# Patient Record
Sex: Male | Born: 1940 | Race: White | Hispanic: No | Marital: Married | Smoking: Never smoker
Health system: Southern US, Community
[De-identification: ages and names within clinical notes are randomized; demographics above are authoritative.]

## PROBLEM LIST (undated history)

## (undated) ENCOUNTER — Ambulatory Visit: Admission: EM

## (undated) DIAGNOSIS — K5790 Diverticulosis of intestine, part unspecified, without perforation or abscess without bleeding: Secondary | ICD-10-CM

## (undated) DIAGNOSIS — T7840XA Allergy, unspecified, initial encounter: Secondary | ICD-10-CM

## (undated) DIAGNOSIS — Z8619 Personal history of other infectious and parasitic diseases: Secondary | ICD-10-CM

## (undated) DIAGNOSIS — T8859XA Other complications of anesthesia, initial encounter: Secondary | ICD-10-CM

## (undated) DIAGNOSIS — C44311 Basal cell carcinoma of skin of nose: Secondary | ICD-10-CM

## (undated) DIAGNOSIS — J9383 Other pneumothorax: Secondary | ICD-10-CM

## (undated) DIAGNOSIS — Z85828 Personal history of other malignant neoplasm of skin: Secondary | ICD-10-CM

## (undated) DIAGNOSIS — C801 Malignant (primary) neoplasm, unspecified: Secondary | ICD-10-CM

## (undated) DIAGNOSIS — Z8709 Personal history of other diseases of the respiratory system: Secondary | ICD-10-CM

## (undated) DIAGNOSIS — M1612 Unilateral primary osteoarthritis, left hip: Secondary | ICD-10-CM

## (undated) DIAGNOSIS — H269 Unspecified cataract: Secondary | ICD-10-CM

## (undated) DIAGNOSIS — L570 Actinic keratosis: Secondary | ICD-10-CM

## (undated) DIAGNOSIS — M545 Low back pain: Secondary | ICD-10-CM

## (undated) HISTORY — DX: Basal cell carcinoma of skin of nose: C44.311

## (undated) HISTORY — DX: Personal history of other diseases of the respiratory system: Z87.09

## (undated) HISTORY — DX: Personal history of melanoma in-situ: Z86.006

## (undated) HISTORY — PX: SKIN CANCER EXCISION: SHX779

## (undated) HISTORY — DX: Allergy, unspecified, initial encounter: T78.40XA

## (undated) HISTORY — DX: Personal history of other infectious and parasitic diseases: Z86.19

## (undated) HISTORY — DX: Diverticulosis of intestine, part unspecified, without perforation or abscess without bleeding: K57.90

## (undated) HISTORY — DX: Actinic keratosis: L57.0

## (undated) HISTORY — PX: JOINT REPLACEMENT: SHX530

## (undated) HISTORY — DX: Low back pain: M54.5

## (undated) HISTORY — PX: LASIK: SHX215

## (undated) HISTORY — DX: Personal history of other malignant neoplasm of skin: Z85.828

## (undated) HISTORY — PX: OTHER SURGICAL HISTORY: SHX169

## (undated) HISTORY — PX: EYE SURGERY: SHX253

## (undated) HISTORY — DX: Unspecified cataract: H26.9

## (undated) HISTORY — DX: Unilateral primary osteoarthritis, left hip: M16.12

---

## 1966-03-16 HISTORY — PX: HEMORRHOID SURGERY: SHX153

## 1980-03-16 DIAGNOSIS — Z8709 Personal history of other diseases of the respiratory system: Secondary | ICD-10-CM

## 1980-03-16 HISTORY — PX: CHEST TUBE INSERTION: SHX231

## 1980-03-16 HISTORY — DX: Personal history of other diseases of the respiratory system: Z87.09

## 1991-03-17 DIAGNOSIS — Z85828 Personal history of other malignant neoplasm of skin: Secondary | ICD-10-CM

## 1991-03-17 HISTORY — DX: Personal history of other malignant neoplasm of skin: Z85.828

## 1999-03-17 HISTORY — PX: TRIGGER FINGER RELEASE: SHX641

## 2003-03-17 DIAGNOSIS — K5790 Diverticulosis of intestine, part unspecified, without perforation or abscess without bleeding: Secondary | ICD-10-CM

## 2003-03-17 HISTORY — DX: Diverticulosis of intestine, part unspecified, without perforation or abscess without bleeding: K57.90

## 2003-07-15 HISTORY — PX: COLONOSCOPY: SHX174

## 2009-04-05 LAB — PSA: PSA: 2.4

## 2010-03-16 DIAGNOSIS — M545 Low back pain, unspecified: Secondary | ICD-10-CM

## 2010-03-16 HISTORY — DX: Low back pain, unspecified: M54.50

## 2011-04-15 ENCOUNTER — Ambulatory Visit (INDEPENDENT_AMBULATORY_CARE_PROVIDER_SITE_OTHER): Payer: Medicare Other | Admitting: Family Medicine

## 2011-04-15 ENCOUNTER — Encounter: Payer: Self-pay | Admitting: Family Medicine

## 2011-04-15 VITALS — BP 112/76 | HR 72 | Temp 97.8°F | Ht 71.5 in | Wt 201.8 lb

## 2011-04-15 DIAGNOSIS — M25519 Pain in unspecified shoulder: Secondary | ICD-10-CM

## 2011-04-15 DIAGNOSIS — Z0000 Encounter for general adult medical examination without abnormal findings: Secondary | ICD-10-CM | POA: Insufficient documentation

## 2011-04-15 DIAGNOSIS — M545 Low back pain, unspecified: Secondary | ICD-10-CM | POA: Diagnosis not present

## 2011-04-15 DIAGNOSIS — M25511 Pain in right shoulder: Secondary | ICD-10-CM | POA: Insufficient documentation

## 2011-04-15 DIAGNOSIS — E669 Obesity, unspecified: Secondary | ICD-10-CM

## 2011-04-15 DIAGNOSIS — M25512 Pain in left shoulder: Secondary | ICD-10-CM | POA: Insufficient documentation

## 2011-04-15 MED ORDER — NAPROXEN 500 MG PO TABS: ORAL_TABLET | ORAL | Status: DC

## 2011-04-15 NOTE — Assessment & Plan Note (Signed)
R>L Anticipate bilateral RTC tendinopathy/injury.  Provided with stretching exercises from [facility] pt advisor.  Pt states has resistance bands at home but has not been doing exercises (daughter in law is physical therapist).  Naprosyn 500mg  bid with food for 3-5 days then prn. If not improving, check Xray, consider formal PT referral. If not improving, consider check ESR to r/o PMR given endorsed stiffness.

## 2011-04-15 NOTE — Patient Instructions (Addendum)
Return at your convenience fasting for blood work, come in a few days afterwards for wellness visit. We will check urine at that visit. Good to meet you today, call us with questions. Take naprosyn twice daily with food for 5 days then as needed.  Use heating pad or ice to shoulders.  If not better, we will consider PT and xray.

## 2011-04-15 NOTE — Progress Notes (Signed)
Subjective:    Patient ID: Samuel Matthews, male    DOB: 1940-11-02, 71 y.o.   MRN: [id number]  HPI CC: new medicare pt establish.  Recently moved from [place], lives in [place].  Mild bilateral shoulder pain - burning sensation.  Currently mild tenderness.  Some early morning stiffness, as well as when playing golf.  Doesn't bother him too much.  Has not tried anything other than aleve occasionally (which doesn't really help).  No neck pain.  No shooting pain down arms, no paresthesias.  Possible mild weakness esp worse with opening car door.  Main concern is progression to the point where unable to play golf anymore.  Denies depression, anhedonia.  Denies falls in last year.  States has had microhematuria in past.  Caffeine: 2-3 cups coffee/day Lives with wife in [place].  Son locally. Occupation: retired, Production designer, theatre/television/film with [employer], now working at golf course Edu: 4 yrs college Activity: plays golf, occasional walking Diet: fruits/vegetables daily, 2x/wk red meat, fish 1x/wk  Preventative: Last CPE 2 yrs ago. Declines flu shot today. No pneumonia shot. Td 2010 Colonoscopy 2005, good for 7-10 yrs Prostate- never had screening.  Discussed.  Declines.  Medications and allergies reviewed and updated in chart.  Past histories reviewed and updated if relevant as below. There is no problem list on file for this patient.  Past Medical History  Diagnosis Date  . History of chicken pox   . 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 Pneumothorax 1982    rib fracture, from a fall   Past Surgical History  Procedure Date  . Hemorrhoid surgery 1968  . Skin cancer excision 1993    Left calf  . Chest tube insertion 1982  . Trigger finger release 2001   History  Substance Use Topics  . Smoking status: Never Smoker   . Smokeless tobacco: Never Used  . Alcohol Use: Yes     Occasional   Family History  Problem Relation Age of Onset  . Coronary artery  disease Mother 14  . Hypertension Father   . Diabetes Neg Hx   . Cancer Neg Hx    Allergies  Allergen Reactions  . Sulfa Drugs Cross Reactors Hives   No current outpatient prescriptions on file prior to visit.   Review of Systems  Constitutional: Negative for fever, chills, activity change, appetite change, fatigue and unexpected weight change.  HENT: Negative for hearing loss and neck pain.   Eyes: Negative for visual disturbance.  Respiratory: Negative for cough, chest tightness, shortness of breath and wheezing.   Cardiovascular: Negative for chest pain, palpitations and leg swelling.  Gastrointestinal: Negative for nausea, vomiting, abdominal pain, diarrhea, constipation, blood in stool and abdominal distention.  Genitourinary: Negative for hematuria and difficulty urinating.  Musculoskeletal: Negative for myalgias and arthralgias.  Skin: Negative for rash.  Neurological: Negative for dizziness, seizures, syncope and headaches.  Hematological: Does not bruise/bleed easily.  Psychiatric/Behavioral: Negative for dysphoric mood. The patient is not nervous/anxious.        Objective:   Physical Exam  Nursing note and vitals reviewed. Constitutional: He is oriented to person, place, and time. He appears well-developed and well-nourished. No distress.  HENT:  Head: Normocephalic and atraumatic.  Right Ear: External ear normal.  Left Ear: External ear normal.  Nose: Nose normal.  Mouth/Throat: Oropharynx is clear and moist. No oropharyngeal exudate.  Eyes: Conjunctivae and EOM are normal. Pupils are equal, round, and reactive to light. No  scleral icterus.  Neck: Normal range of motion. Neck supple. Carotid bruit is not present. No thyromegaly present.  Cardiovascular: Normal rate, regular rhythm, normal heart sounds and intact distal pulses.   No murmur heard. Pulses:      Radial pulses are 2+ on the right side, and 2+ on the left side.  Pulmonary/Chest: Effort normal and  breath sounds normal. No respiratory distress. He has no wheezes. He has no rales.  Abdominal: Soft. Bowel sounds are normal. He exhibits no distension and no mass. There is no tenderness. There is no rebound and no guarding.  Musculoskeletal: Normal range of motion.       No midline spine tenderness, neg spurling, FROM at neck. R shoulder is elevated compared to left. FROM at shoulders. + empty can sign bilaterally R>L.  No pain/weakness with int rotation at shoulder against resistance, mild weakness with ext rotation. Tender to palpation anterior shoulder at bicipital groove but neg speed and yergason test.  Lymphadenopathy:    He has no cervical adenopathy.  Neurological: He is alert and oriented to person, place, and time. He has normal strength. No sensory deficit. He exhibits normal muscle tone.       CN grossly intact, station and gait intact  Skin: Skin is warm and dry. No rash noted.  Psychiatric: He has a normal mood and affect. His behavior is normal. Judgment and thought content normal.       Assessment & Plan:

## 2011-04-15 NOTE — Assessment & Plan Note (Signed)
Advised avoid heavy lifting.

## 2011-04-21 ENCOUNTER — Other Ambulatory Visit (INDEPENDENT_AMBULATORY_CARE_PROVIDER_SITE_OTHER): Payer: Medicare Other

## 2011-04-21 DIAGNOSIS — Z0000 Encounter for general adult medical examination without abnormal findings: Secondary | ICD-10-CM

## 2011-04-21 DIAGNOSIS — E669 Obesity, unspecified: Secondary | ICD-10-CM | POA: Diagnosis not present

## 2011-04-21 LAB — COMPREHENSIVE METABOLIC PANEL
ALT: 16 U/L (ref 0–53)
AST: 17 U/L (ref 0–37)
Albumin: 3.8 g/dL (ref 3.5–5.2)
Alkaline Phosphatase: 30 U/L — ABNORMAL LOW (ref 39–117)
BUN: 15 mg/dL (ref 6–23)
CO2: 28 mEq/L (ref 19–32)
Calcium: 9 mg/dL (ref 8.4–10.5)
Chloride: 106 mEq/L (ref 96–112)
Creatinine, Ser: 1.1 mg/dL (ref 0.4–1.5)
GFR: 69.72 mL/min (ref >60.00)
Glucose, Bld: 98 mg/dL (ref 70–99)
Potassium: 4.8 mEq/L (ref 3.5–5.1)
Sodium: 139 mEq/L (ref 135–145)
Total Bilirubin: 0.7 mg/dL (ref 0.3–1.2)
Total Protein: 6.4 g/dL (ref 6.0–8.3)

## 2011-04-21 LAB — CBC WITH DIFFERENTIAL/PLATELET
Basophils Absolute: 0 10*3/uL (ref 0.0–0.1)
Basophils Relative: 0.3 % (ref 0.0–3.0)
Eosinophils Absolute: 0.3 10*3/uL (ref 0.0–0.7)
Eosinophils Relative: 2.9 % (ref 0.0–5.0)
HCT: 39.8 % (ref 39.0–52.0)
Hemoglobin: 13.8 g/dL (ref 13.0–17.0)
Lymphocytes Relative: 17.2 % (ref 12.0–46.0)
Lymphs Abs: 1.5 10*3/uL (ref 0.7–4.0)
MCHC: 34.7 g/dL (ref 30.0–36.0)
MCV: 93.9 fl (ref 78.0–100.0)
Monocytes Absolute: 0.7 10*3/uL (ref 0.1–1.0)
Monocytes Relative: 7.5 % (ref 3.0–12.0)
Neutro Abs: 6.4 10*3/uL (ref 1.4–7.7)
Neutrophils Relative %: 72.1 % (ref 43.0–77.0)
Platelets: 206 10*3/uL (ref 150.0–400.0)
RBC: 4.23 Mil/uL (ref 4.22–5.81)
RDW: 12.9 % (ref 11.5–14.6)
WBC: 8.9 10*3/uL (ref 4.5–10.5)

## 2011-04-21 LAB — LIPID PANEL
Cholesterol: 171 mg/dL (ref 0–200)
HDL: 52.2 mg/dL (ref >39.00)
LDL Cholesterol: 111 mg/dL — ABNORMAL HIGH (ref 0–99)
Total CHOL/HDL Ratio: 3
Triglycerides: 39 mg/dL (ref 0.0–149.0)
VLDL: 7.8 mg/dL (ref 0.0–40.0)

## 2011-04-28 ENCOUNTER — Ambulatory Visit (INDEPENDENT_AMBULATORY_CARE_PROVIDER_SITE_OTHER): Payer: Medicare Other | Admitting: Family Medicine

## 2011-04-28 ENCOUNTER — Encounter: Payer: Self-pay | Admitting: Family Medicine

## 2011-04-28 VITALS — BP 124/72 | HR 76 | Temp 98.2°F | Wt 201.8 lb

## 2011-04-28 DIAGNOSIS — M25519 Pain in unspecified shoulder: Secondary | ICD-10-CM

## 2011-04-28 DIAGNOSIS — Z0000 Encounter for general adult medical examination without abnormal findings: Secondary | ICD-10-CM | POA: Diagnosis not present

## 2011-04-28 DIAGNOSIS — M25512 Pain in left shoulder: Secondary | ICD-10-CM

## 2011-04-28 DIAGNOSIS — M25511 Pain in right shoulder: Secondary | ICD-10-CM

## 2011-04-28 DIAGNOSIS — Z1211 Encounter for screening for malignant neoplasm of colon: Secondary | ICD-10-CM | POA: Diagnosis not present

## 2011-04-28 DIAGNOSIS — Z23 Encounter for immunization: Secondary | ICD-10-CM

## 2011-04-28 NOTE — Assessment & Plan Note (Signed)
Prior visit thought RTC tendinopathy.  Pt declines PT referral.  Will continue home exercises, if not better return for steroid injections, consider Xray.

## 2011-04-28 NOTE — Progress Notes (Signed)
Subjective:    Patient ID: Samuel Matthews, male    DOB: 10-17-1940, 71 y.o.   MRN: [id number]  HPI CC: wellness exam  Not noticing naprosyn 500mg  helping.  In am especially both shoulders L>R feel weak, stiff and sore.  No numbness.   Started walking more - 2.2 miles 3 times a week.  Feeling great. Wt Readings from Last 3 Encounters:  04/28/11 201 lb 12 oz (91.513 kg)  04/15/11 201 lb 12 oz (91.513 kg)    Caffeine: 2-3 cups coffee/day Lives with wife in [place], son in [place] Occupation: retired, Production designer, theatre/television/film with [employer], now working at golf course Edu: 4 yrs college Activity: plays golf, occasional walking Diet: fruits/vegetables daily, 2x/wk red meat, fish 1x/wk  Preventative:  Last CPE 2 yrs ago.  No h/o pneumonia shot.  Would like today. Td 2010  Colonoscopy 2005, good for 7-10 yrs  Prostate- never had screening. Discussed. Declines. Seat belt 100% time.   Sunscreen use discussed. Vision screen 10/2010.  No problems with hearing.  Passes whispered hearing test. No falls in last year.  Denies anhedonia or depression.  Medications and allergies reviewed and updated in chart.  Past histories reviewed and updated if relevant as below. Patient Active Problem List  Diagnoses  . Bilateral shoulder pain  . Lower back pain  . Medicare annual wellness visit, initial   Past Medical History  Diagnosis Date  . History of chicken pox   . 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 History of pneumothorax 1982    rib fracture, from a fall  . Lower back pain 2012    thought HNP, improved with PT   Past Surgical History  Procedure Date  . Hemorrhoid surgery 1968  . Skin cancer excision 1993    Left calf  . Chest tube insertion 1982  . Trigger finger release 2001   History  Substance Use Topics  . Smoking status: Never Smoker   . Smokeless tobacco: Never Used  . Alcohol Use: Yes     Occasional   Family History  Problem Relation Age  of Onset  . Coronary artery disease Mother 90  . Hypertension Father   . Diabetes Neg Hx   . Cancer Neg Hx    Allergies  Allergen Reactions  . Sulfa Drugs Cross Reactors Hives   Current Outpatient Prescriptions on File Prior to Visit  Medication Sig Dispense Refill  . aspirin 81 MG tablet Take 160 mg by mouth daily.      . Multiple Vitamin (MULTIVITAMIN) tablet Take 1 tablet by mouth daily.      . naproxen (NAPROSYN) 500 MG tablet Take one po bid x 1 week then prn pain, take with food  60 tablet  0     Review of Systems  Constitutional: Negative for fever, chills, activity change, appetite change, fatigue and unexpected weight change.  HENT: Negative for hearing loss and neck pain.   Eyes: Negative for visual disturbance.  Respiratory: Negative for cough, chest tightness, shortness of breath and wheezing.   Cardiovascular: Negative for chest pain, palpitations and leg swelling.  Gastrointestinal: Negative for nausea, vomiting, abdominal pain, diarrhea, constipation, blood in stool and abdominal distention.  Genitourinary: Negative for hematuria and difficulty urinating.  Musculoskeletal: Negative for myalgias and arthralgias.  Skin: Negative for rash.  Neurological: Negative for dizziness, seizures, syncope and headaches.  Hematological: Does not bruise/bleed easily.  Psychiatric/Behavioral: Negative for dysphoric mood. The patient is  not nervous/anxious.        Objective:   Physical Exam  Nursing note and vitals reviewed. Constitutional: He is oriented to person, place, and time. He appears well-developed and well-nourished. No distress.  HENT:  Head: Normocephalic and atraumatic.  Right Ear: External ear normal.  Left Ear: External ear normal.  Nose: Nose normal.  Mouth/Throat: Oropharynx is clear and moist. No oropharyngeal exudate.  Eyes: Conjunctivae and EOM are normal. Pupils are equal, round, and reactive to light. No scleral icterus.  Neck: Normal range of motion.  Neck supple. Carotid bruit is not present. No thyromegaly present.  Cardiovascular: Normal rate, regular rhythm, normal heart sounds and intact distal pulses.   No murmur heard. Pulses:      Radial pulses are 2+ on the right side, and 2+ on the left side.  Pulmonary/Chest: Effort normal and breath sounds normal. No respiratory distress. He has no wheezes. He has no rales.  Abdominal: Soft. Bowel sounds are normal. He exhibits no distension and no mass. There is no tenderness. There is no rebound and no guarding.  Genitourinary:       declined  Musculoskeletal: Normal range of motion. He exhibits no edema.  Lymphadenopathy:    He has no cervical adenopathy.  Neurological: He is alert and oriented to person, place, and time.       CN grossly intact, station and gait intact No UE weakness, no hip flexor weakness.  Skin: Skin is warm and dry. No rash noted.  Psychiatric: He has a normal mood and affect. His behavior is normal. Judgment and thought content normal.      Assessment & Plan:

## 2011-04-28 NOTE — Assessment & Plan Note (Signed)
I have personally reviewed the Medicare Annual Wellness questionnaire and have noted 1. The patient's medical and social history 2. Their use of alcohol, tobacco or illicit drugs 3. Their current medications and supplements 4. The patient's functional ability including ADL's, fall risks, home safety risks and hearing or visual impairment. 5. Diet and physical activities 6. Evidence for depression or mood disorders The patients weight, height, BMI have been recorded in the chart.  Hearing and vision has been addressed. I have made referrals, counseling and provided education to the patient based review of the above and I have provided the pt with a written personalized care plan for preventive services.  Set up with [facility].  Last colonsocopy 2005, normal. Declines prostate screening - has never had. Vision screen due 10/2011.

## 2011-04-28 NOTE — Progress Notes (Signed)
Addended by: Josph Macho A on: 04/28/2011 09:21 AM   Modules accepted: Orders

## 2011-04-28 NOTE — Patient Instructions (Signed)
Call your insurace about the shingles shot to see if it is covered or how much it would cost and where is cheaper (here or pharmacy).  If you want to receive here, call for nurse visit. Pneumonia shot today. Stool kit today. Let me know if shoulders not improving. Good to see you today, call us with questions.

## 2011-05-18 ENCOUNTER — Other Ambulatory Visit: Payer: Medicare Other

## 2011-05-19 ENCOUNTER — Encounter: Payer: Self-pay | Admitting: *Deleted

## 2011-05-19 LAB — FECAL OCCULT BLOOD, IMMUNOCHEMICAL: Fecal Occult Bld: NEGATIVE

## 2011-05-29 DIAGNOSIS — M25519 Pain in unspecified shoulder: Secondary | ICD-10-CM | POA: Diagnosis not present

## 2011-05-29 DIAGNOSIS — M719 Bursopathy, unspecified: Secondary | ICD-10-CM | POA: Diagnosis not present

## 2011-05-29 DIAGNOSIS — M67919 Unspecified disorder of synovium and tendon, unspecified shoulder: Secondary | ICD-10-CM | POA: Diagnosis not present

## 2011-06-19 ENCOUNTER — Other Ambulatory Visit: Payer: Self-pay

## 2011-06-19 MED ORDER — NAPROXEN 500 MG PO TABS: ORAL_TABLET | ORAL | Status: DC

## 2011-06-19 NOTE — Telephone Encounter (Signed)
Sent in

## 2011-06-19 NOTE — Telephone Encounter (Signed)
Pt's wife request refill Naproxen 500 mg for shoulder discomfort. Pt aggravated shoulder again and wants Naproxen 500 mg sent to [facility]. Pts wife can be reached at [phone].

## 2011-07-25 ENCOUNTER — Other Ambulatory Visit: Payer: Self-pay | Admitting: Family Medicine

## 2011-08-24 ENCOUNTER — Other Ambulatory Visit: Payer: Self-pay | Admitting: Family Medicine

## 2011-09-23 ENCOUNTER — Other Ambulatory Visit: Payer: Self-pay | Admitting: Family Medicine

## 2011-10-22 ENCOUNTER — Other Ambulatory Visit: Payer: Self-pay | Admitting: Family Medicine

## 2012-04-11 ENCOUNTER — Encounter: Payer: Self-pay | Admitting: Family Medicine

## 2012-04-12 DIAGNOSIS — H5210 Myopia, unspecified eye: Secondary | ICD-10-CM | POA: Diagnosis not present

## 2012-04-12 DIAGNOSIS — H2510 Age-related nuclear cataract, unspecified eye: Secondary | ICD-10-CM | POA: Diagnosis not present

## 2012-04-12 DIAGNOSIS — H52229 Regular astigmatism, unspecified eye: Secondary | ICD-10-CM | POA: Diagnosis not present

## 2012-04-12 DIAGNOSIS — H524 Presbyopia: Secondary | ICD-10-CM | POA: Diagnosis not present

## 2012-09-14 DIAGNOSIS — M169 Osteoarthritis of hip, unspecified: Secondary | ICD-10-CM | POA: Diagnosis not present

## 2012-09-20 DIAGNOSIS — M25559 Pain in unspecified hip: Secondary | ICD-10-CM | POA: Diagnosis not present

## 2012-09-20 DIAGNOSIS — M169 Osteoarthritis of hip, unspecified: Secondary | ICD-10-CM | POA: Diagnosis not present

## 2012-10-13 ENCOUNTER — Ambulatory Visit (INDEPENDENT_AMBULATORY_CARE_PROVIDER_SITE_OTHER): Payer: Medicare Other | Admitting: Family Medicine

## 2012-10-13 ENCOUNTER — Encounter: Payer: Self-pay | Admitting: Family Medicine

## 2012-10-13 VITALS — BP 124/80 | HR 84 | Temp 97.8°F | Wt 205.5 lb

## 2012-10-13 DIAGNOSIS — T675XXA Heat exhaustion, unspecified, initial encounter: Secondary | ICD-10-CM | POA: Diagnosis not present

## 2012-10-13 LAB — BASIC METABOLIC PANEL
BUN: 14 mg/dL (ref 6–23)
CO2: 28 mEq/L (ref 19–32)
Calcium: 9.4 mg/dL (ref 8.4–10.5)
Chloride: 106 mEq/L (ref 96–112)
Creatinine, Ser: 1 mg/dL (ref 0.4–1.5)
GFR: 74.91 mL/min (ref >60.00)
Glucose, Bld: 98 mg/dL (ref 70–99)
Potassium: 4.6 mEq/L (ref 3.5–5.1)
Sodium: 140 mEq/L (ref 135–145)

## 2012-10-13 NOTE — Assessment & Plan Note (Signed)
Describes 2 episodes of heat exhaustion back to back. I think it reasonable to check Cr and EKG today to r/o renal or cardiac insufficiency from these recent events. Pt agrees with plan  Discussed preventative measures for recurrent heat exhaustion.

## 2012-10-13 NOTE — Addendum Note (Signed)
Addended by: Josph Macho A on: 10/13/2012 09:57 AM   Modules accepted: Orders

## 2012-10-13 NOTE — Patient Instructions (Addendum)
I think you had heat exhaustion, but seems resolved. Regardless, I'd like to check kidneys and EKG today. We will call you with results. Ensure good hydration throughout the day, even if working outside. Good to see you today, call us with questions.  Heat Disorders Heat related disorders are illnesses caused by continued exposure to hot and humid environments, not drinking enough fluids, and/or your body failing to regulate its temperature correctly. People suffer from heat stress and heat related disorders when their bodies are unable to compensate and cool down through sweating. With sufficient heat, sweating is not enough to keep you cool, and your body temperature can rise quickly. Very high body temperatures can damage your brain and other vital organs. High humidity (moisture in the air), adds to heat stress, because it is harder for sweat to evaporate and cool your body. Heat stress and disorders are not uncommon. Some medicines can increase your risk for heat related illness. Ask your caregiver about your medicines during periods of intense heat.  Heat related disorders include:  Heatstroke. When you cannot sweat or regulate your body temperature in an adequate way. This is very dangerous and can be life threatening. Get emergency medical help.  Heat exhaustion. Overheating causes heavy sweating and a fast heart rate. Your body can still regulate its own temperature.  Heat cramps. Painful, uncontrollable muscle spasms. Can occur during heavy exercise in hot environments.  Sunburn. Skin becomes red and painful (burned) after being out in the sun.  Heat rash. Sweat ducts become blocked, which traps sweat under the skin. This causes blisters and red bumps and may cause an itchy or tingling feeling. PREVENTING HEAT STRESS AND HEAT RELATED DISORDERS Overheating can be dangerous and life threatening. When exercising, working, or doing other activities in hot and humid environments, do the  following:  Stay informed by listening to and watching local broadcast weather and safety updates during intense heat.  Air conditioning is the best way to prevent heat disorders. If your home is not air conditioned, spend time in air conditioned places (malls, [employer], or heat shelters set up by your local health department).  Wear light-weight, light colored, loose fitting clothing. Wear as little clothing as possible when at home.  Increase your fluid intake. Drink enough water and fluids to keep your urine clear or pale yellow. DO NOT WAIT UNTIL YOU ARE THIRSTY TO DRINK. You may already be heat stressed, and not recognize it.  If your caregiver has suggested that you limit the amount of fluid you drink or has prescribed water pills for a medical problem, ask how much you should drink when the weather is hot.  Do not drink liquids with alcohol, caffeine, or lots of sugar. They can cause more loss of body fluid.  Heavy sweating drains your body's salt and minerals, which must be replaced. If you must exercise in the heat, a sports beverage can replace the salt and minerals you lose in sweat. If you are on a low-salt diet, check with your caregiver before drinking a sports beverage.  Sunburn reduces your body's ability to cool itself and causes a loss of needed body fluids. If you go outdoors, protect yourself from the sun by wearing a wide-brimmed hat, along with sunglasses.  Put on sunscreen of SPF 15 or higher, 30 minutes before going out. (The most effective products say "broad spectrum" or "UVA/UVB protection" on the label.) Reapply sunscreen frequently -- at least every 1-2 hours.  Take added precautions when  both the heat and humidity are high.  Rest often.  Even young and otherwise healthy people can become heat stressed and suffer from a heat disorder, if they participate in strenuous activities during hot weather.  If you must be outdoors, try going out only during  morning and evening hours, when it is cooler. Rest often in shady areas, so that your body's temperature can adjust.  If your heart pounds or you are gasping for breath, STOP all activity. Go immediately to a cool area, or at least into the shade, and rest. This is especially true if you become lightheaded, confused, weak, or faint.  Electric fans may make you comfortable, but they DO NOT prevent heat related problems. SYMPTOMS   Headache.  Nosebleed.  Weakness.  You feel very hot.  Muscle cramps.  Restlessness.  Fainting or dizziness.  Fast breathing and shortness of breath.  Excessive sweating. (There may be little or no sweating in late stages of heat exhaustion.)  Rapid pulse, heart pounding.  Feeling sick to your stomach (nauseous, vomiting).  Skin becoming cold and clammy, or excessively hot and dry. HOME CARE INSTRUCTIONS   Lie down and rest in a cool or air conditioned area.  Drink enough water and fluids to keep your urine clear or pale yellow. Avoid fluids with caffeine or high sugar content. Avoid coffee, tea, alcohol or stimulants.  Do not take salt tablets, unless advised by your caregiver.  Avoid hot foods and heavy meals.  Bathe or shower in cool water.  Wear minimal clothing.  Use a fan. Add cool or warm mist to the air, if possible.  If possible, decrease the use of your stove or oven at home.  Monitor adults at risk at least twice a day, watching closely for signs of heat exhaustion or heat stroke. Infants and young children also require more frequent watching.  Never leave infants, children or pets in a parked car, even if the windows are cracked open.  If you are 18 years of age or older, have a friend or relative call to check on you twice a day during a heat wave. If you know someone in this age group, check on them at least twice a day. SEEK IMMEDIATE MEDICAL CARE IF:  You have a hard time breathing.  You vomit or pass blood in your  stool.  You have a seizure, feel dizzy or faint, or pass out.  You develop severe sweating.  Your skin is red, hot and dry (there is no sweating).  Your urine turns a dark color or has blood in it.  You are making very little or no urine.  You are unable to keep fluids down.  You develop chest or abdominal pain.  You develop a throbbing headache.  You develop nausea or confusion. IF YOU OBSERVE SOMEONE WHO MIGHT HAVE HEAT STROKE This can be life threatening. Call your local emergency services (911 in the [place]  If the victim is in the sun, get him or her to a shady area.  Cool the victim rapidly, using whatever methods you have:  Place the victim in a tub of cool water or a cool shower.  Spray the victim with cool water from a garden hose, or sponge the person with cool water.  Wrap the victim in a cool, wet sheet and fan them.  If emergency medical help is delayed, call the hospital emergency room or your local emergency services (911 in the [place] for further instructions.  Sometimes  a victim's muscles will begin to twitch from heat stroke. If this happens, keep the victim from injuring himself. However, do not place any object in the mouth. Give fluids, unless the muscle twitching makes it difficult or unsafe to do so. If there is vomiting, make sure the airway remains open by turning the victim on his or her side. Document Released: 02/28/2000 Document Revised: 05/25/2011 Document Reviewed: 12/17/2008 [facility] Patient Information 2014 [place], [place].

## 2012-10-13 NOTE — Progress Notes (Signed)
  Subjective:    Patient ID: Samuel Matthews, male    DOB: 07-27-1940, 72 y.o.   MRN: [id number]  HPI CC: dehydration  Thinks may have overdone it last Wednesday - working on removing 10-12 foot saplings.  Cut them down with chainsaw then cut them into pieces.  Felt winded and lightheaded, diaphoretic.  The next day removed large heavy stones in backyard - again felt lightheaded, saw spots in vision, cramping in hands, short of breath.  Hot day.  Was working for 3 hours straight - hadn't eaten anything that night.  2 days later, drove up to [place] - felt nauseated, vomiting, GI upset.  That night had no nocturia.  Overall feeling better - back to normal.   Has been drinking gatorade.  Never chest pain or tightness.  No headaches.  Upcoming golf trip next week.  To [place]  On aspirin 81mg  daily and multivitamin. fmhx CAD - mother at age 29.   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  Had steroid shot into hip which didn't help pain.  May start aleve.  Past Medical History  Diagnosis Date  . History of chicken pox   . 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 History of pneumothorax 1982    rib fracture, from a fall  . Lower back pain 2012    thought HNP, improved with PT    Past Surgical History  Procedure Laterality Date  . Hemorrhoid surgery  1968  . Skin cancer excision  1993    Left calf  . Chest tube insertion  1982  . Trigger finger release  2001    Family History  Problem Relation Age of Onset  . Coronary artery disease Mother 11  . Hypertension Father   . Diabetes Neg Hx   . Cancer Neg Hx     Review of Systems Per HPI    Objective:   Physical Exam  Nursing note and vitals reviewed. Constitutional: He appears well-developed and well-nourished. No distress.  HENT:  Head: Normocephalic and atraumatic.  Mouth/Throat: Oropharynx is clear and moist. No oropharyngeal exudate.   Cardiovascular: Normal rate, regular rhythm, normal heart sounds and intact distal pulses.   No murmur heard. Pulmonary/Chest: Effort normal and breath sounds normal. No respiratory distress. He has no wheezes. He has no rales.  Musculoskeletal: He exhibits no edema.  Skin: Skin is warm and dry. No rash noted.  Psychiatric: He has a normal mood and affect.       Assessment & Plan:

## 2012-10-14 ENCOUNTER — Telehealth: Payer: Self-pay | Admitting: Family Medicine

## 2012-10-14 DIAGNOSIS — Z85828 Personal history of other malignant neoplasm of skin: Secondary | ICD-10-CM

## 2012-10-14 NOTE — Telephone Encounter (Signed)
Referral placed.  plz notify wife

## 2012-10-14 NOTE — Telephone Encounter (Signed)
Patients wife notified

## 2012-10-14 NOTE — Telephone Encounter (Signed)
Pt's wife called and says pt has a raised spot on the back of his left calf and that several years ago he had cancerous tissue removed from that let.  He wants a referral to a dermatologist. Can you refer him? Or does he need to see you again first? Thank you.

## 2012-11-10 DIAGNOSIS — M169 Osteoarthritis of hip, unspecified: Secondary | ICD-10-CM | POA: Diagnosis not present

## 2012-11-24 ENCOUNTER — Telehealth: Payer: Self-pay | Admitting: Family Medicine

## 2012-11-24 NOTE — Telephone Encounter (Signed)
Appt scheduled

## 2012-11-24 NOTE — Telephone Encounter (Addendum)
Received preop eval request from [place] ortho Dr. 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 plz call pt and schedule preop appt at his convenience. Doesn't need EKG as done 09/2012 but may need further blood work

## 2012-12-05 DIAGNOSIS — Z85828 Personal history of other malignant neoplasm of skin: Secondary | ICD-10-CM | POA: Diagnosis not present

## 2012-12-05 DIAGNOSIS — L905 Scar conditions and fibrosis of skin: Secondary | ICD-10-CM | POA: Diagnosis not present

## 2012-12-05 DIAGNOSIS — C44721 Squamous cell carcinoma of skin of unspecified lower limb, including hip: Secondary | ICD-10-CM | POA: Diagnosis not present

## 2012-12-05 DIAGNOSIS — C44621 Squamous cell carcinoma of skin of unspecified upper limb, including shoulder: Secondary | ICD-10-CM | POA: Diagnosis not present

## 2012-12-05 DIAGNOSIS — D485 Neoplasm of uncertain behavior of skin: Secondary | ICD-10-CM | POA: Diagnosis not present

## 2012-12-05 DIAGNOSIS — D239 Other benign neoplasm of skin, unspecified: Secondary | ICD-10-CM | POA: Diagnosis not present

## 2012-12-13 ENCOUNTER — Encounter: Payer: Self-pay | Admitting: Family Medicine

## 2012-12-13 ENCOUNTER — Ambulatory Visit (INDEPENDENT_AMBULATORY_CARE_PROVIDER_SITE_OTHER): Payer: Medicare Other | Admitting: Family Medicine

## 2012-12-13 VITALS — BP 116/70 | HR 76 | Temp 98.1°F | Wt 205.5 lb

## 2012-12-13 DIAGNOSIS — Z01818 Encounter for other preprocedural examination: Secondary | ICD-10-CM

## 2012-12-13 DIAGNOSIS — Z23 Encounter for immunization: Secondary | ICD-10-CM

## 2012-12-13 LAB — CBC WITH DIFFERENTIAL/PLATELET
Basophils Absolute: 0 10*3/uL (ref 0.0–0.1)
Basophils Relative: 0.6 % (ref 0.0–3.0)
Eosinophils Absolute: 0.3 10*3/uL (ref 0.0–0.7)
Eosinophils Relative: 4.1 % (ref 0.0–5.0)
HCT: 39.4 % (ref 39.0–52.0)
Hemoglobin: 13.4 g/dL (ref 13.0–17.0)
Lymphocytes Relative: 25.4 % (ref 12.0–46.0)
Lymphs Abs: 1.6 10*3/uL (ref 0.7–4.0)
MCHC: 34 g/dL (ref 30.0–36.0)
MCV: 94 fl (ref 78.0–100.0)
Monocytes Absolute: 0.6 10*3/uL (ref 0.1–1.0)
Monocytes Relative: 9.1 % (ref 3.0–12.0)
Neutro Abs: 3.8 10*3/uL (ref 1.4–7.7)
Neutrophils Relative %: 60.8 % (ref 43.0–77.0)
Platelets: 227 10*3/uL (ref 150.0–400.0)
RBC: 4.19 Mil/uL — ABNORMAL LOW (ref 4.22–5.81)
RDW: 13.1 % (ref 11.5–14.6)
WBC: 6.3 10*3/uL (ref 4.5–10.5)

## 2012-12-13 NOTE — Progress Notes (Signed)
  Subjective:    Patient ID: Samuel Matthews, male    DOB: 1941/03/11, 72 y.o.   MRN: [id number]  HPI CC: preop evaluation  Received preop eval request from [place] orth[facility] Dr. Despina Hick for upcoming L hip replacement dated 02/03/2013.  Did have hip interarticular steroid injection with poor results.  Told bone on bone.  Taking advil/aleve prn.    Still able to play golf and do things he wants to do, however notes pain especially with getting up from seated position and with certain movements.  H/o remote broken ribs and collapsed lung s/p chest tube insertion after fall while playing volley. 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  EKG done 09/2012 after heat exhaustion from yardwork - NSR rate 60, normal axis, intervals, no acute ST/T changes.  Recently joined [employer], goes 3x/wk for 45 min.  Using stationary bike - riding stationary bike 3 miles regularly, using upper extremity weights.  No prior h/o major surgery or general anesthesia. Drug allergy - sulfa.  On aspirin 81mg  daily and multivitamin.  fmhx CAD - mother at age 1.  No h/o HLD, DM, HTN No h/o blood disorders. Body mass index is 28.27 kg/(m^2).  Past Medical History  Diagnosis Date  . History of chicken pox   . 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 History of pneumothorax 1982    rib fracture, from a fall  . Lower back pain 2012    thought HNP, improved with PT    Past Surgical History  Procedure Laterality Date  . Hemorrhoid surgery  1968  . Skin cancer excision  1993    Left calf  . Chest tube insertion  1982  . Trigger finger release  2001    Family History  Problem Relation Age of Onset  . Coronary artery disease Mother 62  . Hypertension Father   . Diabetes Neg Hx   . Cancer Neg Hx    Current Outpatient Prescriptions on File Prior to Visit  Medication Sig Dispense Refill  . aspirin 81 MG tablet Take 160 mg by mouth daily.      . Multiple Vitamin (MULTIVITAMIN)  tablet Take 1 tablet by mouth daily.       No current facility-administered medications on file prior to visit.    Review of Systems No recent chest pain, shortness of breath, headaches, lightheadedness or dizziness, cough, fevers/chills.  No appetite or weight changes.  No recent viral URI.  No easy bruising or bleeding.     Objective:   Physical Exam  Nursing note and vitals reviewed. Constitutional: He appears well-developed and well-nourished. No distress.  HENT:  Head: Normocephalic and atraumatic.  Mouth/Throat: Oropharynx is clear and moist. No oropharyngeal exudate.  Eyes: Conjunctivae and EOM are normal. Pupils are equal, round, and reactive to light.  Neck: Normal range of motion. Neck supple. Carotid bruit is not present. No thyromegaly present.  Cardiovascular: Normal rate, regular rhythm, normal heart sounds and intact distal pulses.   No murmur heard. Pulmonary/Chest: Effort normal and breath sounds normal. No respiratory distress. He has no wheezes. He has no rales.  Musculoskeletal: He exhibits no edema.  Lymphadenopathy:    He has no cervical adenopathy.  Psychiatric: He has a normal mood and affect.       Assessment & Plan:

## 2012-12-13 NOTE — Assessment & Plan Note (Addendum)
Intermediate risk procedure, low risk patient (main risk is age).  Anticipated MET of 10. Recent Cr stable.  Check CBC today.  Recent EKG stable.  No need for CXR today. No prior experience with GETA. Discussed should be adequately low risk to proceed with planned surgery. rec maximize non narcotic analgesia post op.

## 2012-12-13 NOTE — Patient Instructions (Signed)
Flu shot today Blood work today. I hope you have a speedy recovery!

## 2012-12-14 ENCOUNTER — Encounter: Payer: Self-pay | Admitting: *Deleted

## 2013-01-23 ENCOUNTER — Encounter (HOSPITAL_COMMUNITY): Payer: Self-pay | Admitting: Pharmacist

## 2013-01-24 ENCOUNTER — Other Ambulatory Visit: Payer: Self-pay | Admitting: Orthopedic Surgery

## 2013-01-24 NOTE — Progress Notes (Signed)
Preoperative surgical orders have been place into the [facility] hospital system for Samuel Matthews on 01/24/2013, 9:50 AM  by Patrica Duel for surgery on 02/03/2013.  Preop Total Hip - Anterior Approach orders including Experel Injecion, PO Tylenol, and IV Decadron as long as there are no contraindications to the above medications. [name], PA-C

## 2013-01-25 ENCOUNTER — Ambulatory Visit (HOSPITAL_COMMUNITY)
Admission: RE | Admit: 2013-01-25 | Discharge: 2013-01-25 | Disposition: A | Discharge status: Home / Self Care | Payer: Medicare Other | Source: Ambulatory Visit | Attending: Orthopedic Surgery | Admitting: Orthopedic Surgery

## 2013-01-25 ENCOUNTER — Encounter (HOSPITAL_COMMUNITY): Payer: Self-pay

## 2013-01-25 ENCOUNTER — Encounter (HOSPITAL_COMMUNITY)
Admission: RE | Admit: 2013-01-25 | Discharge: 2013-01-25 | Disposition: A | Discharge status: Home / Self Care | Payer: Medicare Other | Source: Ambulatory Visit | Attending: Orthopedic Surgery | Admitting: Orthopedic Surgery

## 2013-01-25 DIAGNOSIS — Z01818 Encounter for other preprocedural examination: Secondary | ICD-10-CM | POA: Diagnosis not present

## 2013-01-25 DIAGNOSIS — M169 Osteoarthritis of hip, unspecified: Secondary | ICD-10-CM | POA: Diagnosis not present

## 2013-01-25 DIAGNOSIS — M1610 Unilateral primary osteoarthritis, unspecified hip: Secondary | ICD-10-CM | POA: Diagnosis not present

## 2013-01-25 HISTORY — DX: Malignant (primary) neoplasm, unspecified: C80.1

## 2013-01-25 HISTORY — DX: Other pneumothorax: J93.83

## 2013-01-25 LAB — COMPREHENSIVE METABOLIC PANEL
ALT: 12 U/L (ref 0–53)
AST: 16 U/L (ref 0–37)
Albumin: 3.9 g/dL (ref 3.5–5.2)
Alkaline Phosphatase: 36 U/L — ABNORMAL LOW (ref 39–117)
BUN: 15 mg/dL (ref 6–23)
CO2: 25 mEq/L (ref 19–32)
Calcium: 9.2 mg/dL (ref 8.4–10.5)
Chloride: 104 mEq/L (ref 96–112)
Creatinine, Ser: 1.06 mg/dL (ref 0.50–1.35)
GFR calc Af Amer: 78 mL/min — ABNORMAL LOW (ref >90)
GFR calc non Af Amer: 67 mL/min — ABNORMAL LOW (ref >90)
Glucose, Bld: 92 mg/dL (ref 70–99)
Potassium: 4.1 mEq/L (ref 3.5–5.1)
Sodium: 139 mEq/L (ref 135–145)
Total Bilirubin: 0.3 mg/dL (ref 0.3–1.2)
Total Protein: 7 g/dL (ref 6.0–8.3)

## 2013-01-25 LAB — TYPE AND SCREEN
ABO/RH(D): A NEG
Antibody Screen: NEGATIVE

## 2013-01-25 LAB — URINALYSIS, ROUTINE W REFLEX MICROSCOPIC
Bilirubin Urine: NEGATIVE
Glucose, UA: NEGATIVE mg/dL
Ketones, ur: NEGATIVE mg/dL
Leukocytes, UA: NEGATIVE
Nitrite: NEGATIVE
Protein, ur: NEGATIVE mg/dL
Specific Gravity, Urine: 1.014 (ref 1.005–1.030)
Urobilinogen, UA: 0.2 mg/dL (ref 0.0–1.0)
pH: 6.5 (ref 5.0–8.0)

## 2013-01-25 LAB — CBC
HCT: 40.5 % (ref 39.0–52.0)
Hemoglobin: 14.3 g/dL (ref 13.0–17.0)
MCH: 32.1 pg (ref 26.0–34.0)
MCHC: 35.3 g/dL (ref 30.0–36.0)
MCV: 91 fL (ref 78.0–100.0)
Platelets: 232 10*3/uL (ref 150–400)
RBC: 4.45 MIL/uL (ref 4.22–5.81)
RDW: 12.1 % (ref 11.5–15.5)
WBC: 6.8 10*3/uL (ref 4.0–10.5)

## 2013-01-25 LAB — URINE MICROSCOPIC-ADD ON

## 2013-01-25 LAB — PROTIME-INR
INR: 0.95 (ref 0.00–1.49)
Prothrombin Time: 12.5 seconds (ref 11.6–15.2)

## 2013-01-25 LAB — APTT: aPTT: 28 seconds (ref 24–37)

## 2013-01-25 LAB — SURGICAL PCR SCREEN
MRSA, PCR: NEGATIVE
Staphylococcus aureus: NEGATIVE

## 2013-01-25 LAB — ABO/RH: ABO/RH(D): A NEG

## 2013-01-25 NOTE — Pre-Procedure Instructions (Signed)
JAMEAL RAZZANO  01/25/2013   Your procedure is scheduled on:  Friday, February 03, 2013  Report to [facility] Short Stay (use Main Entrance "A'') at 10:30 AM.  Call this number if you have problems the morning of surgery: [phone]   Remember:   Do not eat food or drink liquids after midnight.   Take these medicines the morning of surgery with A SIP OF WATER: None Stop taking Aspirin, Multivitamins and herbal medications. Do not take any NSAIDs ie: Ibuprofen, Advil, Naproxen or any medication containing Aspirin. Stop taking Aspirin and Multivitamins on 01/29/13 (5 days prior to procedure).  Do not wear jewelry, make-up or nail polish.  Do not wear lotions, powders, or perfumes. You may wear deodorant.  Do not shave 48 hours prior to surgery. Men may shave face and neck.  Do not bring valuables to the hospital.  [facility] is not responsible for any belongings or valuables.               Contacts, dentures or bridgework may not be worn into surgery.  Leave suitcase in the car. After surgery it may be brought to your room.  For patients admitted to the hospital, discharge time is determined by your treatment team.               Patients discharged the day of surgery will not be allowed to drive home.  Name and phone number of your driver:   Special Instructions: Shower using CHG 2 nights before surgery and the night before surgery.  If you shower the day of surgery use CHG.  Use special wash - you have one bottle of CHG for all showers.  You should use approximately 1/3 of the bottle for each shower.   Please read over the following fact sheets that you were given: Pain Booklet, Coughing and Deep Breathing, Blood Transfusion Information, MRSA Information and Surgical Site Infection Prevention

## 2013-02-02 ENCOUNTER — Other Ambulatory Visit: Payer: Self-pay | Admitting: Orthopedic Surgery

## 2013-02-02 MED ORDER — CEFAZOLIN SODIUM-DEXTROSE 2-3 GM-% IV SOLR
2.0000 g | INTRAVENOUS | Status: AC
  Administered 2013-02-03: 2 g via INTRAVENOUS
  Filled 2013-02-02: qty 50

## 2013-02-02 NOTE — H&P (Signed)
Samuel Matthews  DOB: 01/24/1941 Married / Language: English / Race: White Male  Date of Admission:  02/03/2013  Chief Complaint:  Left hip pain  History of Present Illness The patient is a 72 year old male who comes in for a preoperative History and Physical. The patient is scheduled for a left total hip arthroplasty (anterior approach) to be performed by Dr. Frank V. [name], MD at [facility] on 02/03/2013. The patient is a 72 year old male who presents for follow up of their hip. The patient is being followed for their left hip pain. They are 7 week(s) out from IA injection. Symptoms reported today include: pain. The patient feels that they are doing poorly. Current treatment includes: home exercise program (going to the gym 3X weekly). The following medication has been used for pain control: Aleve or Advil. Note for "Follow-up Hip": Patient states that the injection did not help. He has also noticed some pain in his lower back and buttock. The left hip hurts him at all times. Pain is always worse with activity but also occurs some at rest. It is hard to get comfortable at night. He is losing mobility. It is limiting what he can and can not do. His right hip does not hurt. He is ready to proceed with surgery on the left hip. They have been treated conservatively in the past for the above stated problem and despite conservative measures, they continue to have progressive pain and severe functional limitations and dysfunction. They have failed non-operative management including home exercise, medications. It is felt that they would benefit from undergoing total joint replacement. Risks and benefits of the procedure have been discussed with the patient and they elect to proceed with surgery. There are no active contraindications to surgery such as ongoing infection or rapidly progressive neurological disease.      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 Hives.    Family History Cerebrovascular Accident. First Degree Relatives. father Heart disease in male family member before age 55 Hypertension. father    Social History Illicit drug use. no Living situation. live with spouse Exercise. Exercises weekly; does other Drug/Alcohol Rehab (Currently). no Drug/Alcohol Rehab (Previously). no Marital status. married Pain Contract. no Tobacco / smoke exposure. no Current work status. working part time Tobacco use. Never smoker. never smoker Alcohol use. current drinker; drinks hard liquor; only occasionally per week Children. 4 Post-Surgical Plans. Home Advance Directives. Living Will, Healthcare POA    Medication History Baby Aspirin (81MG Tablet Chewable, 1 Oral daily) Active. Multi Vitamin Mens ( Oral) Specific dose unknown - Active.    Past Surgical History Hemorrhoidectomy Skin Excision Left Calf - Skin Cancer Chest Tube Insertion - Peunomthorax. Date: 1982. Trigger Finger Release  Medical History Skin Cancer. Squamous Cell Carcinoma Pneumothorax. Past History Chicken pox (052.9)   Review of Systems General:Not Present- Chills, Fever, Night Sweats, Fatigue, Weight Gain, Weight Loss and Memory Loss. Skin:Not Present- Hives, Itching, Rash, Eczema and Lesions. HEENT:Not Present- Tinnitus, Headache, Double Vision, Visual Loss, Hearing Loss and Dentures. Respiratory:Not Present- Shortness of breath with exertion, Shortness of breath at rest, Allergies, Coughing up blood and Chronic Cough. Cardiovascular:Not Present- Chest Pain, Racing/skipping heartbeats, Difficulty Breathing Lying Down, Murmur, Swelling and Palpitations. Gastrointestinal:Not Present- Bloody Stool, Heartburn, Abdominal Pain, Vomiting, Nausea, Constipation, Diarrhea, Difficulty Swallowing, Jaundice and Loss of appetitie. Male Genitourinary:Not Present- Urinary frequency, Blood in  Urine, Weak urinary stream, Discharge, Flank Pain, Incontinence, Painful Urination, Urgency, Urinary   Retention and Urinating at Night. Musculoskeletal:Present- Joint Pain and Back Pain. Not Present- Muscle Weakness, Muscle Pain, Joint Swelling, Morning Stiffness and Spasms. Neurological:Not Present- Tremor, Dizziness, Blackout spells, Paralysis, Difficulty with balance and Weakness. Psychiatric:Not Present- Insomnia.    Vitals Weight: 195 lb Height: 72 in Weight was reported by patient. Height was reported by patient. Body Surface Area: 2.12 m Body Mass Index: 26.45 kg/m Pulse: 68 (Regular) Resp.: 12 (Unlabored) BP: 112/64 (Sitting, Right Arm, Standard)     Physical Exam The physical exam findings are as follows:   General Mental Status - Alert, cooperative and good historian. General Appearance- pleasant. Not in acute distress. Orientation- Oriented X3. Build & Nutrition- Well nourished and Well developed.   Head and Neck Head- normocephalic, atraumatic . Neck Global Assessment- supple. no bruit auscultated on the right and no bruit auscultated on the left.   Eye Pupil- Bilateral- Regular and Round. Motion- Bilateral- EOMI.   Chest and Lung Exam Auscultation: Breath sounds:- clear at anterior chest wall and - clear at posterior chest wall. Adventitious sounds:- No Adventitious sounds.   Cardiovascular Auscultation:Rhythm- Regular rate and rhythm. Heart Sounds- S1 WNL and S2 WNL. Murmurs & Other Heart Sounds:Auscultation of the heart reveals - No Murmurs.   Abdomen Palpation/Percussion:Tenderness- Abdomen is non-tender to palpation. Rigidity (guarding)- Abdomen is soft. Auscultation:Auscultation of the abdomen reveals - Bowel sounds normal.   Male Genitourinary Not done, not pertinent to present illness  Musculoskeletal On exam well developed male alert and oriented in no apparent distress. His right hip can  be flexed to 110, rotated in 30, out 40, abducted 40 without discomfort. Left hip flexion 90, no internal or external rotation, only about 20 degrees of abduction.  RADIOGRAPHS: Radiographs are reviewed from last visit, AP pelvis and lateral of the left hip, and he has severe endstage arthritis of the left hip, bone on bone throughout with some subluxation of the femoral head.   Assessment & Plan Osteoarthritis, hip (715.35) Impression: Left Hip  Note: Plan is for a Left Total Hip Replacement - Anterior Approach by Dr. [name].  Plan is to go home.  PCP - Dr.Javier Gutierrez - Patient has been seen preoperatively and felt to be stable for surgery.  The patient does not have any contraindications and will receive TXA (tranexamic acid) prior to surgery.  Signed electronically by Alexzandrew L Perkins, III PA-C 

## 2013-02-03 ENCOUNTER — Encounter (HOSPITAL_COMMUNITY): Payer: Medicare Other | Admitting: Vascular Surgery

## 2013-02-03 ENCOUNTER — Inpatient Hospital Stay (HOSPITAL_COMMUNITY): Payer: Medicare Other

## 2013-02-03 ENCOUNTER — Encounter (HOSPITAL_COMMUNITY)
Admission: RE | Disposition: A | Discharge status: Home Health | Payer: Self-pay | Source: Ambulatory Visit | Attending: Orthopedic Surgery

## 2013-02-03 ENCOUNTER — Inpatient Hospital Stay (HOSPITAL_COMMUNITY): Payer: Medicare Other | Admitting: Anesthesiology

## 2013-02-03 ENCOUNTER — Encounter (HOSPITAL_COMMUNITY): Payer: Self-pay | Admitting: *Deleted

## 2013-02-03 ENCOUNTER — Inpatient Hospital Stay (HOSPITAL_COMMUNITY)
Admission: RE | Admit: 2013-02-03 | Discharge: 2013-02-05 | DRG: 470 | Disposition: A | Discharge status: Home Health | Payer: Medicare Other | Source: Ambulatory Visit | Attending: Orthopedic Surgery | Admitting: Orthopedic Surgery

## 2013-02-03 DIAGNOSIS — Z471 Aftercare following joint replacement surgery: Secondary | ICD-10-CM | POA: Diagnosis not present

## 2013-02-03 DIAGNOSIS — M169 Osteoarthritis of hip, unspecified: Secondary | ICD-10-CM | POA: Diagnosis present

## 2013-02-03 DIAGNOSIS — M1610 Unilateral primary osteoarthritis, unspecified hip: Principal | ICD-10-CM | POA: Diagnosis present

## 2013-02-03 DIAGNOSIS — Z85828 Personal history of other malignant neoplasm of skin: Secondary | ICD-10-CM | POA: Diagnosis not present

## 2013-02-03 DIAGNOSIS — M25559 Pain in unspecified hip: Secondary | ICD-10-CM | POA: Diagnosis not present

## 2013-02-03 DIAGNOSIS — M259 Joint disorder, unspecified: Secondary | ICD-10-CM | POA: Diagnosis not present

## 2013-02-03 DIAGNOSIS — Z96649 Presence of unspecified artificial hip joint: Secondary | ICD-10-CM | POA: Diagnosis not present

## 2013-02-03 HISTORY — PX: TOTAL HIP ARTHROPLASTY: SHX124

## 2013-02-03 SURGERY — ARTHROPLASTY, HIP, TOTAL, ANTERIOR APPROACH
Anesthesia: Spinal | Site: Hip | Laterality: Left | Wound class: Clean

## 2013-02-03 MED ORDER — MENTHOL 3 MG MT LOZG: 1.0000 | LOZENGE | OROMUCOSAL | Status: DC | PRN

## 2013-02-03 MED ORDER — BUPIVACAINE HCL (PF) 0.25 % IJ SOLN
INTRAMUSCULAR | Status: DC | PRN
  Administered 2013-02-03: 20 mL

## 2013-02-03 MED ORDER — CEFAZOLIN SODIUM-DEXTROSE 2-3 GM-% IV SOLR
2.0000 g | Freq: Four times a day (QID) | INTRAVENOUS | Status: AC
  Administered 2013-02-03 – 2013-02-04 (×2): 2 g via INTRAVENOUS
  Filled 2013-02-03 (×2): qty 50

## 2013-02-03 MED ORDER — DIPHENHYDRAMINE HCL 12.5 MG/5ML PO ELIX: 12.5000 mg | ORAL_SOLUTION | ORAL | Status: DC | PRN

## 2013-02-03 MED ORDER — ACETAMINOPHEN 10 MG/ML IV SOLN
1000.0000 mg | Freq: Once | INTRAVENOUS | Status: AC
  Administered 2013-02-03: 1000 mg via INTRAVENOUS

## 2013-02-03 MED ORDER — PROMETHAZINE HCL 25 MG/ML IJ SOLN: 6.2500 mg | INTRAMUSCULAR | Status: DC | PRN

## 2013-02-03 MED ORDER — CHLORHEXIDINE GLUCONATE 4 % EX LIQD: 60.0000 mL | Freq: Once | CUTANEOUS | Status: DC

## 2013-02-03 MED ORDER — RIVAROXABAN 10 MG PO TABS
10.0000 mg | ORAL_TABLET | Freq: Every day | ORAL | Status: DC
  Administered 2013-02-04 – 2013-02-05 (×2): 10 mg via ORAL
  Filled 2013-02-03 (×3): qty 1

## 2013-02-03 MED ORDER — BUPIVACAINE IN DEXTROSE 0.75-8.25 % IT SOLN
INTRATHECAL | Status: DC | PRN
  Administered 2013-02-03: 14 mg via INTRATHECAL

## 2013-02-03 MED ORDER — OXYCODONE HCL 5 MG PO TABS: 5.0000 mg | ORAL_TABLET | Freq: Once | ORAL | Status: DC | PRN

## 2013-02-03 MED ORDER — BUPIVACAINE HCL (PF) 0.25 % IJ SOLN
INTRAMUSCULAR | Status: AC
  Filled 2013-02-03: qty 30

## 2013-02-03 MED ORDER — BUPIVACAINE LIPOSOME 1.3 % IJ SUSP
20.0000 mL | Freq: Once | INTRAMUSCULAR | Status: DC
  Filled 2013-02-03: qty 20

## 2013-02-03 MED ORDER — ACETAMINOPHEN 325 MG PO TABS
650.0000 mg | ORAL_TABLET | Freq: Four times a day (QID) | ORAL | Status: DC | PRN
  Administered 2013-02-05: 650 mg via ORAL
  Filled 2013-02-03: qty 2

## 2013-02-03 MED ORDER — BUPIVACAINE LIPOSOME 1.3 % IJ SUSP
INTRAMUSCULAR | Status: DC | PRN
  Administered 2013-02-03: 20 mL

## 2013-02-03 MED ORDER — DEXAMETHASONE SODIUM PHOSPHATE 10 MG/ML IJ SOLN: 10.0000 mg | Freq: Once | INTRAMUSCULAR | Status: DC

## 2013-02-03 MED ORDER — FLEET ENEMA 7-19 GM/118ML RE ENEM: 1.0000 | ENEMA | Freq: Once | RECTAL | Status: AC | PRN

## 2013-02-03 MED ORDER — METHOCARBAMOL 100 MG/ML IJ SOLN
500.0000 mg | Freq: Four times a day (QID) | INTRAVENOUS | Status: DC | PRN
  Filled 2013-02-03: qty 5

## 2013-02-03 MED ORDER — POLYETHYLENE GLYCOL 3350 17 G PO PACK: 17.0000 g | PACK | Freq: Every day | ORAL | Status: DC | PRN

## 2013-02-03 MED ORDER — ACETAMINOPHEN 650 MG RE SUPP: 650.0000 mg | Freq: Four times a day (QID) | RECTAL | Status: DC | PRN

## 2013-02-03 MED ORDER — 0.9 % SODIUM CHLORIDE (POUR BTL) OPTIME
TOPICAL | Status: DC | PRN
  Administered 2013-02-03: 1000 mL

## 2013-02-03 MED ORDER — METHOCARBAMOL 500 MG PO TABS: 500.0000 mg | ORAL_TABLET | Freq: Four times a day (QID) | ORAL | Status: DC | PRN

## 2013-02-03 MED ORDER — TRANEXAMIC ACID 100 MG/ML IV SOLN
1000.0000 mg | INTRAVENOUS | Status: AC
  Administered 2013-02-03: 1000 mg via INTRAVENOUS
  Filled 2013-02-03: qty 10

## 2013-02-03 MED ORDER — OXYCODONE HCL 5 MG PO TABS
5.0000 mg | ORAL_TABLET | ORAL | Status: DC | PRN
  Administered 2013-02-04: 10 mg via ORAL
  Filled 2013-02-03: qty 2

## 2013-02-03 MED ORDER — MORPHINE SULFATE 2 MG/ML IJ SOLN: 1.0000 mg | INTRAMUSCULAR | Status: DC | PRN

## 2013-02-03 MED ORDER — SODIUM CHLORIDE 0.9 % IJ SOLN
INTRAMUSCULAR | Status: DC | PRN
  Administered 2013-02-03: 30 mL via INTRAVENOUS

## 2013-02-03 MED ORDER — DEXAMETHASONE SODIUM PHOSPHATE 10 MG/ML IJ SOLN
INTRAMUSCULAR | Status: AC
  Administered 2013-02-03: 10 mg via INTRAVENOUS
  Filled 2013-02-03: qty 1

## 2013-02-03 MED ORDER — LACTATED RINGERS IV SOLN
INTRAVENOUS | Status: DC
  Administered 2013-02-03: 11:00:00 via INTRAVENOUS

## 2013-02-03 MED ORDER — PROPOFOL INFUSION 10 MG/ML OPTIME
INTRAVENOUS | Status: DC | PRN
  Administered 2013-02-03: 50 ug/kg/min via INTRAVENOUS

## 2013-02-03 MED ORDER — BISACODYL 10 MG RE SUPP: 10.0000 mg | Freq: Every day | RECTAL | Status: DC | PRN

## 2013-02-03 MED ORDER — DEXTROSE-NACL 5-0.9 % IV SOLN
INTRAVENOUS | Status: DC
  Administered 2013-02-03 – 2013-02-04 (×2): via INTRAVENOUS

## 2013-02-03 MED ORDER — ONDANSETRON HCL 4 MG PO TABS: 4.0000 mg | ORAL_TABLET | Freq: Four times a day (QID) | ORAL | Status: DC | PRN

## 2013-02-03 MED ORDER — HYDROMORPHONE HCL PF 1 MG/ML IJ SOLN: 0.2500 mg | INTRAMUSCULAR | Status: DC | PRN

## 2013-02-03 MED ORDER — FENTANYL CITRATE 0.05 MG/ML IJ SOLN
INTRAMUSCULAR | Status: DC | PRN
  Administered 2013-02-03 (×3): 50 ug via INTRAVENOUS

## 2013-02-03 MED ORDER — ALBUMIN HUMAN 5 % IV SOLN
INTRAVENOUS | Status: DC | PRN
  Administered 2013-02-03: 15:00:00 via INTRAVENOUS

## 2013-02-03 MED ORDER — OXYCODONE HCL 5 MG/5ML PO SOLN: 5.0000 mg | Freq: Once | ORAL | Status: DC | PRN

## 2013-02-03 MED ORDER — METOCLOPRAMIDE HCL 5 MG PO TABS
5.0000 mg | ORAL_TABLET | Freq: Three times a day (TID) | ORAL | Status: DC | PRN
  Filled 2013-02-03: qty 2

## 2013-02-03 MED ORDER — METOCLOPRAMIDE HCL 5 MG/ML IJ SOLN: 5.0000 mg | Freq: Three times a day (TID) | INTRAMUSCULAR | Status: DC | PRN

## 2013-02-03 MED ORDER — TRAMADOL HCL 50 MG PO TABS: 50.0000 mg | ORAL_TABLET | Freq: Four times a day (QID) | ORAL | Status: DC | PRN

## 2013-02-03 MED ORDER — ACETAMINOPHEN 500 MG PO TABS
1000.0000 mg | ORAL_TABLET | Freq: Four times a day (QID) | ORAL | Status: AC
  Administered 2013-02-03 – 2013-02-04 (×4): 1000 mg via ORAL
  Filled 2013-02-03 (×4): qty 2

## 2013-02-03 MED ORDER — LACTATED RINGERS IV SOLN
INTRAVENOUS | Status: DC | PRN
  Administered 2013-02-03 (×2): via INTRAVENOUS

## 2013-02-03 MED ORDER — MIDAZOLAM HCL 5 MG/5ML IJ SOLN
INTRAMUSCULAR | Status: DC | PRN
  Administered 2013-02-03 (×2): 1 mg via INTRAVENOUS

## 2013-02-03 MED ORDER — DEXAMETHASONE 6 MG PO TABS
10.0000 mg | ORAL_TABLET | Freq: Every day | ORAL | Status: AC
  Administered 2013-02-04: 11:00:00 10 mg via ORAL
  Filled 2013-02-03: qty 1

## 2013-02-03 MED ORDER — DOCUSATE SODIUM 100 MG PO CAPS
100.0000 mg | ORAL_CAPSULE | Freq: Two times a day (BID) | ORAL | Status: DC
  Administered 2013-02-03 – 2013-02-05 (×4): 100 mg via ORAL
  Filled 2013-02-03 (×6): qty 1

## 2013-02-03 MED ORDER — ONDANSETRON HCL 4 MG/2ML IJ SOLN: 4.0000 mg | Freq: Four times a day (QID) | INTRAMUSCULAR | Status: DC | PRN

## 2013-02-03 MED ORDER — ONDANSETRON HCL 4 MG/2ML IJ SOLN
INTRAMUSCULAR | Status: DC | PRN
  Administered 2013-02-03: 4 mg via INTRAVENOUS

## 2013-02-03 MED ORDER — DEXAMETHASONE SODIUM PHOSPHATE 10 MG/ML IJ SOLN
10.0000 mg | Freq: Every day | INTRAMUSCULAR | Status: AC
  Filled 2013-02-03: qty 1

## 2013-02-03 MED ORDER — KETOROLAC TROMETHAMINE 15 MG/ML IJ SOLN: 7.5000 mg | Freq: Four times a day (QID) | INTRAMUSCULAR | Status: AC | PRN

## 2013-02-03 MED ORDER — PHENOL 1.4 % MT LIQD: 1.0000 | OROMUCOSAL | Status: DC | PRN

## 2013-02-03 MED ORDER — SODIUM CHLORIDE 0.9 % IV SOLN: INTRAVENOUS | Status: DC

## 2013-02-03 SURGICAL SUPPLY — 43 items
BLADE SAW SGTL 18X1.27X75 (BLADE) ×2 IMPLANT
CAPT HIP PF COP ×2 IMPLANT
CLOTH BEACON ORANGE TIMEOUT ST (SAFETY) IMPLANT
CLSR STERI-STRIP ANTIMIC 1/2X4 (GAUZE/BANDAGES/DRESSINGS) ×2 IMPLANT
DECANTER SPIKE VIAL GLASS SM (MISCELLANEOUS) IMPLANT
DRAPE C-ARM 42X72 X-RAY (DRAPES) ×2 IMPLANT
DRAPE STERI IOBAN 125X83 (DRAPES) ×2 IMPLANT
DRAPE U-SHAPE 47X51 STRL (DRAPES) ×6 IMPLANT
DRSG ADAPTIC 3X8 NADH LF (GAUZE/BANDAGES/DRESSINGS) ×2 IMPLANT
DRSG MEPILEX BORDER 4X4 (GAUZE/BANDAGES/DRESSINGS) ×2 IMPLANT
DRSG MEPILEX BORDER 4X8 (GAUZE/BANDAGES/DRESSINGS) ×2 IMPLANT
DURAPREP 26ML APPLICATOR (WOUND CARE) ×2 IMPLANT
ELECT BLADE 6.5 EXT (BLADE) ×2 IMPLANT
ELECT REM PT RETURN 9FT ADLT (ELECTROSURGICAL) ×2
ELECTRODE REM PT RTRN 9FT ADLT (ELECTROSURGICAL) ×1 IMPLANT
EVACUATOR 1/8 PVC DRAIN (DRAIN) IMPLANT
FACESHIELD LNG OPTICON STERILE (SAFETY) ×4 IMPLANT
GLOVE BIO SURGEON STRL SZ8 (GLOVE) ×4 IMPLANT
GLOVE BIOGEL PI IND STRL 8 (GLOVE) ×1 IMPLANT
GLOVE BIOGEL PI INDICATOR 8 (GLOVE) ×1
GLOVE ECLIPSE 8.0 STRL XLNG CF (GLOVE) ×2 IMPLANT
GOWN STRL NON-REIN LRG LVL3 (GOWN DISPOSABLE) ×2 IMPLANT
GOWN STRL REIN XL XLG (GOWN DISPOSABLE) ×2 IMPLANT
KIT BASIN OR (CUSTOM PROCEDURE TRAY) ×2 IMPLANT
NDL SAFETY ECLIPSE 18X1.5 (NEEDLE) ×1 IMPLANT
NEEDLE 18GX1X1/2 (RX/OR ONLY) (NEEDLE) ×2 IMPLANT
NEEDLE 22X1 1/2 (OR ONLY) (NEEDLE) ×2 IMPLANT
NEEDLE HYPO 18GX1.5 SHARP (NEEDLE) ×1
PACK TOTAL JOINT (CUSTOM PROCEDURE TRAY) ×2 IMPLANT
PADDING CAST COTTON 6X4 STRL (CAST SUPPLIES) ×2 IMPLANT
SPONGE GAUZE 4X4 12PLY (GAUZE/BANDAGES/DRESSINGS) ×2 IMPLANT
SUCTION FRAZIER TIP 10 FR DISP (SUCTIONS) ×2 IMPLANT
SUT ETHIBOND NAB CT1 #1 30IN (SUTURE) ×6 IMPLANT
SUT MNCRL AB 4-0 PS2 18 (SUTURE) ×2 IMPLANT
SUT VIC AB 1 CT1 27 (SUTURE) ×1
SUT VIC AB 1 CT1 27XBRD ANTBC (SUTURE) ×1 IMPLANT
SUT VIC AB 2-0 CT1 27 (SUTURE) ×2
SUT VIC AB 2-0 CT1 TAPERPNT 27 (SUTURE) ×2 IMPLANT
SUT VLOC 180 0 24IN GS25 (SUTURE) ×2 IMPLANT
SYR 20CC LL (SYRINGE) ×2 IMPLANT
SYR 50ML LL SCALE MARK (SYRINGE) ×2 IMPLANT
TOWEL OR 17X26 10 PK STRL BLUE (TOWEL DISPOSABLE) ×4 IMPLANT
TRAY FOLEY CATH 14FRSI W/METER (CATHETERS) ×2 IMPLANT

## 2013-02-03 NOTE — Progress Notes (Signed)
Utilization review completed.  

## 2013-02-03 NOTE — Plan of Care (Signed)
Problem: Consults Goal: Diagnosis- Total Joint Replacement Primary Total Hip Left     

## 2013-02-03 NOTE — Preoperative (Signed)
Beta Blockers   Reason not to administer Beta Blockers:Not Applicable 

## 2013-02-03 NOTE — Anesthesia Postprocedure Evaluation (Signed)
  Anesthesia Post-op Note  Patient: Samuel Matthews  Procedure(s) Performed: Procedure(s): TOTAL HIP ARTHROPLASTY ANTERIOR APPROACH (Left)  Patient Location: PACU  Anesthesia Type:Spinal  Level of Consciousness: awake and alert   Airway and Oxygen Therapy: Patient Spontanous Breathing  Post-op Pain: none  Post-op Assessment: Post-op Vital signs reviewed  Post-op Vital Signs: stable  Complications: No apparent anesthesia complications

## 2013-02-03 NOTE — H&P (View-Only) (Signed)
Samuel Matthews  DOB: 10-May-1940 Married / Language: English / Race: White Male  Date of Admission:  02/03/2013  Chief Complaint:  Left hip pain  History of Present Illness The patient is a 72 year old male who comes in for a preoperative History and Physical. The patient is scheduled for a left total hip arthroplasty (anterior approach) to be performed by Dr. Gus Rankin. [name], MD at [facility] on 02/03/2013. The patient is a 72 year old male who presents for follow up of their hip. The patient is being followed for their left hip pain. They are 7 week(s) out from IA injection. Symptoms reported today include: pain. The patient feels that they are doing poorly. Current treatment includes: home exercise program (going to the gym 3X weekly). The following medication has been used for pain control: Aleve or Advil. Note for "Follow-up Hip": Patient states that the injection did not help. He has also noticed some pain in his lower back and buttock. The left hip hurts him at all times. Pain is always worse with activity but also occurs some at rest. It is hard to get comfortable at night. He is losing mobility. It is limiting what he can and can not do. His right hip does not hurt. He is ready to proceed with surgery on the left hip. They have been treated conservatively in the past for the above stated problem and despite conservative measures, they continue to have progressive pain and severe functional limitations and dysfunction. They have failed non-operative management including home exercise, medications. It is felt that they would benefit from undergoing total joint replacement. Risks and benefits of the procedure have been discussed with the patient and they elect to proceed with surgery. There are no active contraindications to surgery such as ongoing infection or rapidly progressive neurological disease.      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 Hives.    Family History Cerebrovascular Accident. First Degree Relatives. father Heart disease in male family member before age 25 Hypertension. father    Social History Illicit drug use. no Living situation. live with spouse Exercise. Exercises weekly; does other Drug/Alcohol Rehab (Currently). no Drug/Alcohol Rehab (Previously). no Marital status. married Pain Contract. no Tobacco / smoke exposure. no Current work status. working part time Tobacco use. Never smoker. never smoker Alcohol use. current drinker; drinks hard liquor; only occasionally per week Children. 4 Post-Surgical Plans. Home Advance Directives. Living Will, Healthcare POA    Medication History Baby Aspirin (81MG  Tablet Chewable, 1 Oral daily) Active. Multi Vitamin Mens ( Oral) Specific dose unknown - Active.    Past Surgical History Hemorrhoidectomy Skin Excision Left Calf - Skin Cancer Chest Tube Insertion - Peunomthorax. Date: 81. Trigger Finger Release  Medical History Skin Cancer. Squamous Cell Carcinoma Pneumothorax. Past History Chicken pox (052.9)   Review of Systems General:Not Present- Chills, Fever, Night Sweats, Fatigue, Weight Gain, Weight Loss and Memory Loss. Skin:Not Present- Hives, Itching, Rash, Eczema and Lesions. HEENT:Not Present- Tinnitus, Headache, Double Vision, Visual Loss, Hearing Loss and Dentures. Respiratory:Not Present- Shortness of breath with exertion, Shortness of breath at rest, Allergies, Coughing up blood and Chronic Cough. Cardiovascular:Not Present- Chest Pain, Racing/skipping heartbeats, Difficulty Breathing Lying Down, Murmur, Swelling and Palpitations. Gastrointestinal:Not Present- Bloody Stool, Heartburn, Abdominal Pain, Vomiting, Nausea, Constipation, Diarrhea, Difficulty Swallowing, Jaundice and Loss of appetitie. Male Genitourinary:Not Present- Urinary frequency, Blood in  Urine, Weak urinary stream, Discharge, Flank Pain, Incontinence, Painful Urination, Urgency, Urinary  Retention and Urinating at Night. Musculoskeletal:Present- Joint Pain and Back Pain. Not Present- Muscle Weakness, Muscle Pain, Joint Swelling, Morning Stiffness and Spasms. Neurological:Not Present- Tremor, Dizziness, Blackout spells, Paralysis, Difficulty with balance and Weakness. Psychiatric:Not Present- Insomnia.    Vitals Weight: 195 lb Height: 72 in Weight was reported by patient. Height was reported by patient. Body Surface Area: 2.12 m Body Mass Index: 26.45 kg/m Pulse: 68 (Regular) Resp.: 12 (Unlabored) BP: 112/64 (Sitting, Right Arm, Standard)     Physical Exam The physical exam findings are as follows:   General Mental Status - Alert, cooperative and good historian. General Appearance- pleasant. Not in acute distress. Orientation- Oriented X3. Build & Nutrition- Well nourished and Well developed.   Head and Neck Head- normocephalic, atraumatic . Neck Global Assessment- supple. no bruit auscultated on the right and no bruit auscultated on the left.   Eye Pupil- Bilateral- Regular and Round. Motion- Bilateral- EOMI.   Chest and Lung Exam Auscultation: Breath sounds:- clear at anterior chest wall and - clear at posterior chest wall. Adventitious sounds:- No Adventitious sounds.   Cardiovascular Auscultation:Rhythm- Regular rate and rhythm. Heart Sounds- S1 WNL and S2 WNL. Murmurs & Other Heart Sounds:Auscultation of the heart reveals - No Murmurs.   Abdomen Palpation/Percussion:Tenderness- Abdomen is non-tender to palpation. Rigidity (guarding)- Abdomen is soft. Auscultation:Auscultation of the abdomen reveals - Bowel sounds normal.   Male Genitourinary Not done, not pertinent to present illness  Musculoskeletal On exam well developed male alert and oriented in no apparent distress. His right hip can  be flexed to 110, rotated in 30, out 40, abducted 40 without discomfort. Left hip flexion 90, no internal or external rotation, only about 20 degrees of abduction.  RADIOGRAPHS: Radiographs are reviewed from last visit, AP pelvis and lateral of the left hip, and he has severe endstage arthritis of the left hip, bone on bone throughout with some subluxation of the femoral head.   Assessment & Plan Osteoarthritis, hip (715.35) Impression: Left Hip  Note: Plan is for a Left Total Hip Replacement - Anterior Approach by Dr. Lequita Halt.  Plan is to go home.  PCP - Dr.Javier Sharen Hones - Patient has been seen preoperatively and felt to be stable for surgery.  The patient does not have any contraindications and will receive TXA (tranexamic acid) prior to surgery.  Signed electronically by Lauraine Rinne, III PA-C

## 2013-02-03 NOTE — Anesthesia Preprocedure Evaluation (Signed)
Anesthesia Evaluation  Patient identified by MRN, date of birth, ID band Patient awake    Reviewed: Allergy & Precautions, H&P , NPO status , Patient's Chart, lab work & pertinent test results  History of Anesthesia Complications Negative for: history of anesthetic complications  Airway Mallampati: I  Neck ROM: Full    Dental  (+) Teeth Intact   Pulmonary neg pulmonary ROS,  breath sounds clear to auscultation        Cardiovascular Rhythm:Regular Rate:Normal     Neuro/Psych    GI/Hepatic negative GI ROS, Neg liver ROS,   Endo/Other    Renal/GU negative Renal ROS     Musculoskeletal  (+) Arthritis -,   Abdominal   Peds  Hematology   Anesthesia Other Findings   Reproductive/Obstetrics                           Anesthesia Physical Anesthesia Plan  ASA: II  Anesthesia Plan: Spinal   Post-op Pain Management:    Induction:   Airway Management Planned: Natural Airway  Additional Equipment:   Intra-op Plan:   Post-operative Plan:   Informed Consent: I have reviewed the patients History and Physical, chart, labs and discussed the procedure including the risks, benefits and alternatives for the proposed anesthesia with the patient or authorized representative who has indicated his/her understanding and acceptance.     Plan Discussed with: CRNA and Surgeon  Anesthesia Plan Comments:         Anesthesia Quick Evaluation

## 2013-02-03 NOTE — Transfer of Care (Signed)
Immediate Anesthesia Transfer of Care Note  Patient: Samuel Matthews  Procedure(s) Performed: Procedure(s): TOTAL HIP ARTHROPLASTY ANTERIOR APPROACH (Left)  Patient Location: PACU  Anesthesia Type:Spinal  Level of Consciousness: awake, alert , oriented and sedated  Airway & Oxygen Therapy: Patient Spontanous Breathing and Patient connected to nasal cannula oxygen  Post-op Assessment: Report given to PACU RN, Post -op Vital signs reviewed and stable and Patient moving all extremities  Post vital signs: Reviewed and stable  Complications: No apparent anesthesia complications

## 2013-02-03 NOTE — Op Note (Signed)
OPERATIVE REPORT  PREOPERATIVE DIAGNOSIS: Osteoarthritis of the Left hip.   POSTOPERATIVE DIAGNOSIS: Osteoarthritis of the Left  hip.   PROCEDURE: Left total hip arthroplasty, anterior approach.   SURGEON: Ollen Gross, MD   ASSISTANT: [name], PA-C  ANESTHESIA:  Spinal  ESTIMATED BLOOD LOSS:- 300 ml   DRAINS: Hemovac x1.   COMPLICATIONS: None   CONDITION: PACU - hemodynamically stable.   BRIEF CLINICAL NOTE: Samuel Matthews is a 72 y.o. male who has advanced end-  stage arthritis of his Left  hip with progressively worsening pain and  dysfunction.The patient has failed nonoperative management and presents for  total hip arthroplasty.   PROCEDURE IN DETAIL: After successful administration of spinal  anesthetic, the traction boots for the [facility] bed were placed on both  feet and the patient was placed onto the [facility] bed, boots placed into the leg  holders. The Left hip was then isolated from the perineum with plastic  drapes and prepped and draped in the usual sterile fashion. ASIS and  greater trochanter were marked and a oblique incision was made, starting  at about 1 cm lateral and 2 cm distal to the ASIS and coursing towards  the anterior cortex of the femur. The skin was cut with a 10 blade  through subcutaneous tissue to the level of the fascia overlying the  tensor fascia lata muscle. The fascia was then incised in line with the  incision at the junction of the anterior third and posterior 2/3rd. The  muscle was teased off the fascia and then the interval between the TFL  and the rectus was developed. The Hohmann retractor was then placed at  the top of the femoral neck over the capsule. The vessels overlying the  capsule were cauterized and the fat on top of the capsule was removed.  A Hohmann retractor was then placed anterior underneath the rectus  femoris to give exposure to the entire anterior capsule. A T-shaped  capsulotomy was performed. The  edges were tagged and the femoral head  was identified.       Osteophytes are removed off the superior acetabulum.  The femoral neck was then cut in situ with an oscillating saw. Traction  was then applied to the left lower extremity utilizing the [facility]  traction. The femoral head was then removed. Retractors were placed  around the acetabulum and then circumferential removal of the labrum was  performed. Osteophytes were also removed. Reaming starts at 47 mm to  medialize and  Increased in 2 mm increments to 55 mm. We reamed in  approximately 40 degrees of abduction, 20 degrees anteversion. A 56 mm  pinnacle acetabular shell was then impacted in anatomic position under  fluoroscopic guidance with excellent purchase. We did not need to place  any additional dome screws. A 36 mm neutral + 4 marathon liner was then  placed into the acetabular shell.       The femoral lift was then placed along the lateral aspect of the femur  just distal to the vastus ridge. The leg was  externally rotated and capsule  was stripped off the inferior aspect of the femoral neck down to the  level of the lesser trochanter, this was done with electrocautery. The femur was lifted after this was performed. The  leg was then placed and extended in adducted position to essentially delivering the femur. We also removed the capsule superiorly and the  piriformis from the  piriformis fossa to gain excellent exposure of the  proximal femur. Rongeur was used to remove some cancellous bone to get  into the lateral portion of the proximal femur for placement of the  initial starter reamer. The starter broaches was placed  the starter broach  and was shown to go down the center of the canal. Broaching  with the  Corail system was then performed starting at size 8, coursing  Up to size 10. A size 10 had excellent torsional and rotational  and axial stability. The trial standard offset neck was then placed  with a 36 + 8.5 trial  head. The hip was then reduced. We confirmed that  the stem was in the canal both on AP and lateral x-rays. It also has excellent sizing. The hip was reduced with outstanding stability through full extension, full external rotation,  and then flexion in adduction internal rotation. AP pelvis was taken  and the leg lengths were measured and found to be exactly equal. Hip  was then dislocated again and the femoral head and neck removed. The  femoral broach was removed. Size 10 Corail stem with a standard offset  neck was then impacted into the femur following native anteversion. Has  excellent purchase in the canal. Excellent torsional and rotational and  axial stability. It is confirmed to be in the canal on AP and lateral  fluoroscopic views. The 36 + 8.5 ceramic head was placed and the hip  reduced with outstanding stability. Again AP pelvis was taken and it  confirmed that the leg lengths were equal. The wound was then copiously  irrigated with saline solution and the capsule reattached and repaired  with Ethibond suture.  20 mL of Exparel mixed with 50 mL of saline then additional 20 ml of .25% Bupivicaine injected into the capsule and into the edge of the tensor fascia lata as well as subcutaneous tissue. The fascia overlying the tensor fascia lata was  then closed with a running #1 V-Loc. Subcu was closed with interrupted  2-0 Vicryl and subcuticular running 4-0 Monocryl. Incision was cleaned  and dried. Steri-Strips and a bulky sterile dressing applied. Hemovac  drain was hooked to suction and then he was awakened and transported to  recovery in stable condition.        Please note that a surgical assistant was a medical necessity for this procedure to perform it in a safe and expeditious manner. Assistant was necessary to provide appropriate retraction of vital neurovascular structures and to prevent femoral fracture and allow for anatomic placement of the prosthesis.  Ollen Gross, M.D.

## 2013-02-03 NOTE — Anesthesia Procedure Notes (Signed)
Spinal  Patient location during procedure: OR Start time: 02/03/2013 1:10 PM End time: 02/03/2013 1:20 PM Staffing Anesthesiologist: Teriann Livingood Performed by: anesthesiologist  Preanesthetic Checklist Completed: patient identified, site marked, surgical consent, pre-op evaluation, timeout performed, IV checked, risks and benefits discussed and monitors and equipment checked Spinal Block Patient position: sitting Prep: Betadine Patient monitoring: cardiac monitor, continuous pulse ox, blood pressure and heart rate Approach: midline Location: L3-4 Injection technique: single-shot Needle Needle type: Tuohy  Needle gauge: 22 G Needle length: 5 cm Needle insertion depth: 3 cm Assessment Sensory level: T6 Additional Notes Tolerated well

## 2013-02-03 NOTE — Interval H&P Note (Signed)
History and Physical Interval Note:  02/03/2013 12:59 PM  Samuel Matthews  has presented today for surgery, with the diagnosis of OA LEFT HIP  The various methods of treatment have been discussed with the patient and family. After consideration of risks, benefits and other options for treatment, the patient has consented to  Procedure(s): TOTAL HIP ARTHROPLASTY ANTERIOR APPROACH (Left) as a surgical intervention .  The patient's history has been reviewed, patient examined, no change in status, stable for surgery.  I have reviewed the patient's chart and labs.  Questions were answered to the patient's satisfaction.     Loanne Drilling

## 2013-02-04 ENCOUNTER — Encounter: Payer: Self-pay | Admitting: Family Medicine

## 2013-02-04 LAB — BASIC METABOLIC PANEL
BUN: 16 mg/dL (ref 6–23)
CO2: 22 mEq/L (ref 19–32)
Calcium: 8.5 mg/dL (ref 8.4–10.5)
Chloride: 107 mEq/L (ref 96–112)
Creatinine, Ser: 0.96 mg/dL (ref 0.50–1.35)
GFR calc Af Amer: >90 mL/min (ref >90)
GFR calc non Af Amer: 80 mL/min — ABNORMAL LOW (ref >90)
Glucose, Bld: 148 mg/dL — ABNORMAL HIGH (ref 70–99)
Potassium: 3.9 mEq/L (ref 3.5–5.1)
Sodium: 137 mEq/L (ref 135–145)

## 2013-02-04 LAB — CBC
HCT: 32.9 % — ABNORMAL LOW (ref 39.0–52.0)
Hemoglobin: 11.8 g/dL — ABNORMAL LOW (ref 13.0–17.0)
MCH: 32.2 pg (ref 26.0–34.0)
MCHC: 35.9 g/dL (ref 30.0–36.0)
MCV: 89.9 fL (ref 78.0–100.0)
Platelets: 214 10*3/uL (ref 150–400)
RBC: 3.66 MIL/uL — ABNORMAL LOW (ref 4.22–5.81)
RDW: 12.2 % (ref 11.5–15.5)
WBC: 14.2 10*3/uL — ABNORMAL HIGH (ref 4.0–10.5)

## 2013-02-04 MED ORDER — OXYCODONE HCL 5 MG PO TABS: 5.0000 mg | ORAL_TABLET | ORAL | Status: DC | PRN

## 2013-02-04 MED ORDER — METHOCARBAMOL 500 MG PO TABS: 500.0000 mg | ORAL_TABLET | Freq: Four times a day (QID) | ORAL | Status: DC | PRN

## 2013-02-04 MED ORDER — RIVAROXABAN 10 MG PO TABS: 10.0000 mg | ORAL_TABLET | Freq: Every day | ORAL | Status: DC

## 2013-02-04 MED ORDER — TRAMADOL HCL 50 MG PO TABS: 50.0000 mg | ORAL_TABLET | Freq: Four times a day (QID) | ORAL | Status: DC | PRN

## 2013-02-04 NOTE — Evaluation (Signed)
Physical Therapy Evaluation Patient Details Name: Samuel Matthews MRN: [id number] DOB: 1940/10/23 Today's Date: 02/04/2013 Time: 1100-1120 PT Time Calculation (min): 20 min  PT Assessment / Plan / Recommendation History of Present Illness  admitted for elective direct anterior THR left  Clinical Impression  Patient s/p direct anterior THR left.  Patient did great with mobility today.  Does show some weakness in left leg impacting gait.  Feel patient will steadily progress and be safe for d/c on Sunday as planned.  Do recommend f/u HH or OP PT to address leg weakness.  Will benefit from skilled PT in hospital to progress gait and independence.      PT Assessment  Patient needs continued PT services    Follow Up Recommendations  Home health PT;Outpatient PT;Supervision - Intermittent    Does the patient have the potential to tolerate intense rehabilitation      Barriers to Discharge        Equipment Recommendations  None recommended by PT    Recommendations for Other Services     Frequency 7X/week    Precautions / Restrictions Precautions Precautions: None   Pertinent Vitals/Pain 3/10 pain left hip.  RN premedicated.      Mobility  Bed Mobility Bed Mobility: Supine to Sit Supine to Sit: 4: Min assist Details for Bed Mobility Assistance: for LLE assistance Transfers Transfers: Sit to Stand;Stand to Sit Sit to Stand: From bed;With upper extremity assist;4: Min guard Stand to Sit: To chair/3-in-1;With upper extremity assist;4: Min guard Ambulation/Gait Ambulation/Gait Assistance: 4: Min guard Ambulation Distance (Feet): 150 Feet Assistive device: Rolling walker Gait Pattern: Step-to pattern;Antalgic    Exercises Total Joint Exercises Ankle Circles/Pumps: AROM;Both;10 reps;Seated Quad Sets: AROM;Both;10 reps;Seated   PT Diagnosis: Difficulty walking;Acute pain  PT Problem List: Decreased strength;Decreased activity tolerance;Decreased mobility PT Treatment  Interventions: DME instruction;Gait training;Stair training;Therapeutic activities;Therapeutic exercise;Functional mobility training;Patient/family education     PT Goals(Current goals can be found in the care plan section) Acute Rehab PT Goals Patient Stated Goal: get home and get back to normal PT Goal Formulation: With patient/family Time For Goal Achievement: 02/06/13 Potential to Achieve Goals: Good  Visit Information  Last PT Received On: 02/04/13 Assistance Needed: +1 History of Present Illness: admitted for elective direct anterior THR left       Prior Functioning  Home Living Family/patient expects to be discharged to:: Private residence Living Arrangements: Spouse/significant other Available Help at Discharge: Family Type of Home: House Home Access: Stairs to enter Secretary/administrator of Steps: 3 Entrance Stairs-Rails: None Home Layout: Full bath on main level Home Equipment: Walker - 2 wheels;Bedside commode Prior Function Level of Independence: Independent Communication Communication: No difficulties    Cognition  Cognition Arousal/Alertness: Awake/alert Behavior During Therapy: WFL for tasks assessed/performed Overall Cognitive Status: Within Functional Limits for tasks assessed    Extremity/Trunk Assessment Upper Extremity Assessment Upper Extremity Assessment: Overall WFL for tasks assessed Lower Extremity Assessment Lower Extremity Assessment: LLE deficits/detail LLE Deficits / Details: knee extension 2/5; ankle dorsiflexion 4/5 Cervical / Trunk Assessment Cervical / Trunk Assessment: Normal   Balance Balance Balance Assessed:  (no apparent balance deficits noted)  End of Session PT - End of Session Equipment Utilized During Treatment: Gait belt Activity Tolerance: Patient tolerated treatment well Patient left: in chair;with family/visitor present;with call bell/phone within reach Nurse Communication: Mobility status  GP     Olivia Canter, [place] [phone] 02/04/2013, 11:36 AM

## 2013-02-04 NOTE — Progress Notes (Signed)
OT Cancellation Note and Discharge  Patient Details Name: ZACCAI CHAVARIN MRN: [id number] DOB: 1941-02-21   Cancelled Treatment:    Reason Eval/Treat Not Completed: OT screened, no needs identified, will sign off. Pt is a anterior hip, has all DME needed at home; and has a walk in shower; up walking with PT with minimal issues.  Evette Georges [phone] 02/04/2013, 11:02 AM

## 2013-02-04 NOTE — Progress Notes (Signed)
   Subjective: 1 Day Post-Op Procedure(s) (LRB): TOTAL HIP ARTHROPLASTY ANTERIOR APPROACH (Left)   Patient reports pain as mild, pain well controlled. No events throughout the night. States that he has had to take minimal amount of pain medication.   Objective:   VITALS:   Filed Vitals:   02/04/13 0544  BP: 118/51  Pulse: 71  Temp: 97.9 F (36.6 C)  Resp: 18    Neurovascular intact Dorsiflexion/Plantar flexion intact Incision: dressing C/D/I No cellulitis present Compartment soft  LABS  Recent Labs  02/04/13 0435  HGB 11.8*  HCT 32.9*  WBC 14.2*  PLT 214     Recent Labs  02/04/13 0435  NA 137  K 3.9  BUN 16  CREATININE 0.96  GLUCOSE 148*     Assessment/Plan: 1 Day Post-Op Procedure(s) (LRB): TOTAL HIP ARTHROPLASTY ANTERIOR APPROACH (Left) Foley cath d/c'ed HV drain d/c'ed Advance diet Up with therapy D/C IV fluids Discharge home with home health eventually when ready, possibly Sunday     Anastasio Auerbach. Estanislao Harmon   PAC  02/04/2013, 8:20 AM

## 2013-02-04 NOTE — Care Management Note (Unsigned)
    Page 1 of 1   02/04/2013     4:49:00 PM   CARE MANAGEMENT NOTE 02/04/2013  Patient:  ROLLA, KEDZIERSKI   Account Number:  [id number]  Date Initiated:  02/04/2013  Documentation initiated by:  Letha Cape  Subjective/Objective Assessment:   dx s/p l total hip 11/21  admit- lives with spouse.     Action/Plan:   Anticipated DC Date:  02/05/2013   Anticipated DC Plan:  HOME W HOME HEALTH SERVICES      DC Planning Services  CM consult      Choice offered to / List presented to:          [facility] arranged  HH-2 PT      [facility] agency  [facility]   Status of service:  In process, will continue to follow Medicare Important Message given?   (If response is "NO", the following Medicare IM given date fields will be blank) Date Medicare IM given:   Date Additional Medicare IM given:    Discharge Disposition:    Per UR Regulation:    If discussed at Long Length of Stay Meetings, dates discussed:    Comments:  02/04/13 14:00 Received call from [facility] rep who states [facility] will not accept pt's insurance for DME.  Sticky note left for MD to place order for DME and [facility] will accept pt's insrance.  Will continue to follow for discharge needs. Freddy Jaksch, BSN, [place] [phone].  02/04/13 9:20 Letha Cape RN, BSN [phone] patient is s/p l total hip , patient has been preoperatively set up with [facility] for hhpt and [facility] for DME. NCM will continue to follow for dc needs.

## 2013-02-05 LAB — CBC
HCT: 33.7 % — ABNORMAL LOW (ref 39.0–52.0)
Hemoglobin: 12 g/dL — ABNORMAL LOW (ref 13.0–17.0)
MCH: 32.2 pg (ref 26.0–34.0)
MCHC: 35.6 g/dL (ref 30.0–36.0)
MCV: 90.3 fL (ref 78.0–100.0)
Platelets: 211 10*3/uL (ref 150–400)
RBC: 3.73 MIL/uL — ABNORMAL LOW (ref 4.22–5.81)
RDW: 12.5 % (ref 11.5–15.5)
WBC: 18 10*3/uL — ABNORMAL HIGH (ref 4.0–10.5)

## 2013-02-05 LAB — BASIC METABOLIC PANEL
BUN: 16 mg/dL (ref 6–23)
CO2: 25 mEq/L (ref 19–32)
Calcium: 8.8 mg/dL (ref 8.4–10.5)
Chloride: 109 mEq/L (ref 96–112)
Creatinine, Ser: 0.87 mg/dL (ref 0.50–1.35)
GFR calc Af Amer: >90 mL/min (ref >90)
GFR calc non Af Amer: 83 mL/min — ABNORMAL LOW (ref >90)
Glucose, Bld: 115 mg/dL — ABNORMAL HIGH (ref 70–99)
Potassium: 3.9 mEq/L (ref 3.5–5.1)
Sodium: 141 mEq/L (ref 135–145)

## 2013-02-05 NOTE — Progress Notes (Signed)
   Subjective: 2 Days Post-Op Procedure(s) (LRB): TOTAL HIP ARTHROPLASTY ANTERIOR APPROACH (Left) Patient reports pain as mild.   Plan is to go Home after hospital stay.  Objective: Vital signs in last 24 hours: Temp:  [97.6 F (36.4 C)-98.3 F (36.8 C)] 97.7 F (36.5 C) (11/23 0527) Pulse Rate:  [68-80] 68 (11/23 0527) Resp:  [18] 18 (11/23 0527) BP: (126-137)/(60-72) 137/72 mmHg (11/23 0527) SpO2:  [96 %-98 %] 96 % (11/23 0527)  Intake/Output from previous day:  Intake/Output Summary (Last 24 hours) at 02/05/13 1023 Last data filed at 02/05/13 0730  Gross per 24 hour  Intake    840 ml  Output   2100 ml  Net  -1260 ml    Intake/Output this shift: Total I/O In: -  Out: 600 [Urine:600]  Labs:  Recent Labs  02/04/13 0435 02/05/13 0500  HGB 11.8* 12.0*    Recent Labs  02/04/13 0435 02/05/13 0500  WBC 14.2* 18.0*  RBC 3.66* 3.73*  HCT 32.9* 33.7*  PLT 214 211    Recent Labs  02/04/13 0435 02/05/13 0500  NA 137 141  K 3.9 3.9  CL 107 109  CO2 22 25  BUN 16 16  CREATININE 0.96 0.87  GLUCOSE 148* 115*  CALCIUM 8.5 8.8   No results found for this basename: LABPT, INR,  in the last 72 hours  EXAM General - Patient is Alert, Appropriate and Oriented Extremity - Neurologically intact Neurovascular intact Incision: dressing C/D/I No cellulitis present Compartment soft Dressing/Incision - clean, dry, no drainage Motor Function - intact, moving foot and toes well on exam.   Past Medical History  Diagnosis Date  . History of chicken pox   . 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 History of pneumothorax 1982    rib fracture, from a fall  . Lower back pain 2012    thought HNP, improved with PT  . Osteoarthritis of left hip   . Cancer     Hx: of squamous cell left calf  . Pneumothorax, acute     Hx: of    Assessment/Plan: 2 Days Post-Op Procedure(s) (LRB): TOTAL HIP ARTHROPLASTY ANTERIOR APPROACH (Left) Principal  Problem:   OA (osteoarthritis) of hip   Up with therapy Discharge home with home health  DVT Prophylaxis - Xarelto Weight Bearing As Tolerated left Leg  Lyriq Jarchow V 02/05/2013, 10:23 AM

## 2013-02-05 NOTE — Progress Notes (Signed)
Physical Therapy Treatment Patient Details Name: Samuel Matthews MRN: [id number] DOB: [birth year] Today's Date: 02/05/2013 Time: 1137-1200 PT Time Calculation (min): 23 min  PT Assessment / Plan / Recommendation  History of Present Illness admitted for elective direct anterior THR left   PT Comments   Pt completed stair education today, ready to discharge home with family assist/supervison.  Follow Up Recommendations  Home health PT;Outpatient PT;Supervision - Intermittent     Equipment Recommendations  None recommended by PT    Frequency 7X/week   Progress towards PT Goals Progress towards PT goals: Progressing toward goals  Plan Current plan remains appropriate    Precautions / Restrictions Precautions Precautions: None Restrictions LLE Weight Bearing: Weight bearing as tolerated    Mobility  Bed Mobility Supine to Sit: 5: Supervision;HOB flat Transfers Sit to Stand: 6: Modified independent (Device/Increase time);From bed;With upper extremity assist;From chair/3-in-1 Stand to Sit: 6: Modified independent (Device/Increase time);To chair/3-in-1;Without upper extremity assist;With armrests;Other (comment) (x1 to/from chair without armrests) Ambulation/Gait Ambulation/Gait Assistance: 5: Supervision;6: Modified independent (Device/Increase time) Ambulation Distance (Feet): 400 Feet (plus 15 feet with cane) Assistive device: Rolling walker;Straight cane Ambulation/Gait Assistance Details: cues initially on walker position with gait, cues on sequence with cane use. Gait Pattern: Step-through pattern;Antalgic Gait velocity: decreased Stairs: Yes Stairs Assistance: 5: Supervision Stairs Assistance Details (indicate cue type and reason): simulated pt's doorjamb at top of stairs that he will be using by using the top portion of the training stairs,not the rails. cues on correct sequence. Stair Management Technique: No rails;Step to pattern;Forwards Number of Stairs: 2     Exercises Total Joint Exercises Hip ABduction/ADduction: AROM;Strengthening;Left;10 reps;Standing Knee Flexion: AROM;Strengthening;Standing;Left;10 reps (mini squats) Marching in Standing: AROM;Strengthening;Left;10 reps;Standing Standing Hip Extension: Strengthening;AROM;Left;10 reps;Standing     PT Goals (current goals can now be found in the care plan section) Acute Rehab PT Goals Patient Stated Goal: get home and get back to normal PT Goal Formulation: With patient/family Time For Goal Achievement: 02/06/13 Potential to Achieve Goals: Good  Visit Information  Last PT Received On: 02/05/13 Assistance Needed: +1 History of Present Illness: admitted for elective direct anterior THR left    Subjective Data  Patient Stated Goal: get home and get back to normal   Cognition  Cognition Arousal/Alertness: Awake/alert Behavior During Therapy: WFL for tasks assessed/performed Overall Cognitive Status: Within Functional Limits for tasks assessed       End of Session PT - End of Session Equipment Utilized During Treatment: Gait belt Activity Tolerance: Patient tolerated treatment well Patient left: in chair;with family/visitor present;with call bell/phone within reach Nurse Communication: Mobility status   GP     Sallyanne Kuster 02/05/2013, 2:36 PM  Sallyanne Kuster, PTA Office- [phone]

## 2013-02-05 NOTE — Progress Notes (Signed)
   CARE MANAGEMENT NOTE 02/05/2013  Patient:  Samuel Matthews, Samuel Matthews   Account Number:  [id number]  Date Initiated:  02/04/2013  Documentation initiated by:  Letha Cape  Subjective/Objective Assessment:   dx s/p l total hip 11/21  admit- lives with spouse.     Action/Plan:   Anticipated DC Date:  02/05/2013   Anticipated DC Plan:  HOME W HOME HEALTH SERVICES      DC Planning Services  CM consult      Choice offered to / List presented to:        DME agency  [facility]     HH arranged  HH-2 PT      [facility] agency  [facility]   Status of service:  Completed, signed off Medicare Important Message given?   (If response is "NO", the following Medicare IM given date fields will be blank) Date Medicare IM given:   Date Additional Medicare IM given:    Discharge Disposition:  HOME W HOME HEALTH SERVICES  Per UR Regulation:    If discussed at Long Length of Stay Meetings, dates discussed:    Comments:  02/05/13 13:00 Cm spoke with RN to ensure order place for necessary DME from [facility] to be delivered to pt prior to discharge.  [facility] on campus and aware of pt's dicharge today.  No other CM needs were communicated.  Freddy Jaksch, BSN, [place] [phone]  02/04/13 14:00 Received call from [facility] rep who states [facility] will not accept pt's insurance for DME.  Sticky note left for MD to place order for DME and [facility] will accept pt's insrance.  Will continue to follow for discharge needs. Freddy Jaksch, BSN, [place] [phone].  02/04/13 9:20 Letha Cape RN, BSN [phone] patient is s/p l total hip , patient has been preoperatively set up with [facility] for hhpt and [facility] for DME. NCM will continue to follow for dc needs.

## 2013-02-06 DIAGNOSIS — Z471 Aftercare following joint replacement surgery: Secondary | ICD-10-CM | POA: Diagnosis not present

## 2013-02-06 DIAGNOSIS — Z7901 Long term (current) use of anticoagulants: Secondary | ICD-10-CM | POA: Diagnosis not present

## 2013-02-06 DIAGNOSIS — Z96649 Presence of unspecified artificial hip joint: Secondary | ICD-10-CM | POA: Diagnosis not present

## 2013-02-06 DIAGNOSIS — IMO0001 Reserved for inherently not codable concepts without codable children: Secondary | ICD-10-CM | POA: Diagnosis not present

## 2013-02-07 ENCOUNTER — Encounter (HOSPITAL_COMMUNITY): Payer: Self-pay | Admitting: Orthopedic Surgery

## 2013-02-08 DIAGNOSIS — Z7901 Long term (current) use of anticoagulants: Secondary | ICD-10-CM | POA: Diagnosis not present

## 2013-02-08 DIAGNOSIS — IMO0001 Reserved for inherently not codable concepts without codable children: Secondary | ICD-10-CM | POA: Diagnosis not present

## 2013-02-08 DIAGNOSIS — Z96649 Presence of unspecified artificial hip joint: Secondary | ICD-10-CM | POA: Diagnosis not present

## 2013-02-08 DIAGNOSIS — Z471 Aftercare following joint replacement surgery: Secondary | ICD-10-CM | POA: Diagnosis not present

## 2013-02-10 DIAGNOSIS — Z471 Aftercare following joint replacement surgery: Secondary | ICD-10-CM | POA: Diagnosis not present

## 2013-02-10 DIAGNOSIS — Z7901 Long term (current) use of anticoagulants: Secondary | ICD-10-CM | POA: Diagnosis not present

## 2013-02-10 DIAGNOSIS — Z96649 Presence of unspecified artificial hip joint: Secondary | ICD-10-CM | POA: Diagnosis not present

## 2013-02-10 DIAGNOSIS — IMO0001 Reserved for inherently not codable concepts without codable children: Secondary | ICD-10-CM | POA: Diagnosis not present

## 2013-02-13 DIAGNOSIS — Z7901 Long term (current) use of anticoagulants: Secondary | ICD-10-CM | POA: Diagnosis not present

## 2013-02-13 DIAGNOSIS — Z96649 Presence of unspecified artificial hip joint: Secondary | ICD-10-CM | POA: Diagnosis not present

## 2013-02-13 DIAGNOSIS — Z471 Aftercare following joint replacement surgery: Secondary | ICD-10-CM | POA: Diagnosis not present

## 2013-02-13 DIAGNOSIS — IMO0001 Reserved for inherently not codable concepts without codable children: Secondary | ICD-10-CM | POA: Diagnosis not present

## 2013-02-15 DIAGNOSIS — Z7901 Long term (current) use of anticoagulants: Secondary | ICD-10-CM | POA: Diagnosis not present

## 2013-02-15 DIAGNOSIS — IMO0001 Reserved for inherently not codable concepts without codable children: Secondary | ICD-10-CM | POA: Diagnosis not present

## 2013-02-15 DIAGNOSIS — Z471 Aftercare following joint replacement surgery: Secondary | ICD-10-CM | POA: Diagnosis not present

## 2013-02-15 DIAGNOSIS — Z96649 Presence of unspecified artificial hip joint: Secondary | ICD-10-CM | POA: Diagnosis not present

## 2013-02-16 NOTE — Discharge Summary (Signed)
Physician Discharge Summary   Patient ID: Samuel Matthews MRN: [id number] DOB/AGE: 1940/10/22 72 y.o.  Admit date: 02/03/2013 Discharge date: 02/05/2013  Primary Diagnosis:  Osteoarthritis of the Left hip.   Admission Diagnoses:  Past Medical History  Diagnosis Date  . History of chicken pox   . 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 History of pneumothorax 1982    rib fracture, from a fall  . Lower back pain 2012    thought HNP, improved with PT  . Osteoarthritis of left hip   . Cancer     Hx: of squamous cell left calf  . Pneumothorax, acute     Hx: of   Discharge Diagnoses:   Principal Problem:   OA (osteoarthritis) of hip  Estimated body mass index is 27.86 kg/(m^2) as calculated from the following:   Height as of 01/25/13: 6' (1.829 m).   Weight as of 10/13/12: 93.214 kg (205 lb 8 oz).  Procedure(s) (LRB): TOTAL HIP ARTHROPLASTY ANTERIOR APPROACH (Left)   Consults: None  HPI: Samuel Matthews is a 72 y.o. male who has advanced end-  stage arthritis of his Left hip with progressively worsening pain and  dysfunction.The patient has failed nonoperative management and presents for  total hip arthroplasty.  Laboratory Data: Admission on 02/03/2013, Discharged on 02/05/2013  Component Date Value Range Status  . WBC 02/04/2013 14.2* 4.0 - 10.5 K/uL Final  . RBC 02/04/2013 3.66* 4.22 - 5.81 MIL/uL Final  . Hemoglobin 02/04/2013 11.8* 13.0 - 17.0 g/dL Final  . HCT 40/98/1191 32.9* 39.0 - 52.0 % Final  . MCV 02/04/2013 89.9  78.0 - 100.0 fL Final  . MCH 02/04/2013 32.2  26.0 - 34.0 pg Final  . MCHC 02/04/2013 35.9  30.0 - 36.0 g/dL Final  . RDW 47/82/9562 12.2  11.5 - 15.5 % Final  . Platelets 02/04/2013 214  150 - 400 K/uL Final  . Sodium 02/04/2013 137  135 - 145 mEq/L Final  . Potassium 02/04/2013 3.9  3.5 - 5.1 mEq/L Final  . Chloride 02/04/2013 107  96 - 112 mEq/L Final  . CO2 02/04/2013 22  19 - 32 mEq/L Final  . Glucose, Bld 02/04/2013  148* 70 - 99 mg/dL Final  . BUN 13/10/6576 16  6 - 23 mg/dL Final  . Creatinine, Ser 02/04/2013 0.96  0.50 - 1.35 mg/dL Final  . Calcium 46/96/2952 8.5  8.4 - 10.5 mg/dL Final  . GFR calc non Af Amer 02/04/2013 80* >90 mL/min Final  . GFR calc Af Amer 02/04/2013 >90  >90 mL/min Final   Comment: (NOTE)                          The eGFR has been calculated using the CKD EPI equation.                          This calculation has not been validated in all clinical situations.                          eGFR's persistently <90 mL/min signify possible Chronic Kidney                          Disease.  . WBC 02/05/2013 18.0* 4.0 - 10.5 K/uL Final  . RBC 02/05/2013 3.73* 4.22 - 5.81 MIL/uL  Final  . Hemoglobin 02/05/2013 12.0* 13.0 - 17.0 g/dL Final  . HCT 16/12/9602 33.7* 39.0 - 52.0 % Final  . MCV 02/05/2013 90.3  78.0 - 100.0 fL Final  . MCH 02/05/2013 32.2  26.0 - 34.0 pg Final  . MCHC 02/05/2013 35.6  30.0 - 36.0 g/dL Final  . RDW 54/11/8117 12.5  11.5 - 15.5 % Final  . Platelets 02/05/2013 211  150 - 400 K/uL Final  . Sodium 02/05/2013 141  135 - 145 mEq/L Final  . Potassium 02/05/2013 3.9  3.5 - 5.1 mEq/L Final  . Chloride 02/05/2013 109  96 - 112 mEq/L Final  . CO2 02/05/2013 25  19 - 32 mEq/L Final  . Glucose, Bld 02/05/2013 115* 70 - 99 mg/dL Final  . BUN 14/78/2956 16  6 - 23 mg/dL Final  . Creatinine, Ser 02/05/2013 0.87  0.50 - 1.35 mg/dL Final  . Calcium 21/30/8657 8.8  8.4 - 10.5 mg/dL Final  . GFR calc non Af Amer 02/05/2013 83* >90 mL/min Final  . GFR calc Af Amer 02/05/2013 >90  >90 mL/min Final   Comment: (NOTE)                          The eGFR has been calculated using the CKD EPI equation.                          This calculation has not been validated in all clinical situations.                          eGFR's persistently <90 mL/min signify possible Chronic Kidney                          Disease.  Hospital Outpatient Visit on 01/25/2013  Component Date Value Range  Status  . MRSA, PCR 01/25/2013 NEGATIVE  NEGATIVE Final  . Staphylococcus aureus 01/25/2013 NEGATIVE  NEGATIVE Final   Comment:                                 The Xpert SA Assay (FDA                          approved for NASAL specimens                          in patients over 47 years of age),                          is one component of                          a comprehensive surveillance                          program.  Test performance has                          been validated by [employer] for patients greater  than or equal to 28 year old.                          It is not intended                          to diagnose infection nor to                          guide or monitor treatment.  Marland Kitchen aPTT 01/25/2013 28  24 - 37 seconds Final  . WBC 01/25/2013 6.8  4.0 - 10.5 K/uL Final  . RBC 01/25/2013 4.45  4.22 - 5.81 MIL/uL Final  . Hemoglobin 01/25/2013 14.3  13.0 - 17.0 g/dL Final  . HCT 16/12/9602 40.5  39.0 - 52.0 % Final  . MCV 01/25/2013 91.0  78.0 - 100.0 fL Final  . MCH 01/25/2013 32.1  26.0 - 34.0 pg Final  . MCHC 01/25/2013 35.3  30.0 - 36.0 g/dL Final  . RDW 54/11/8117 12.1  11.5 - 15.5 % Final  . Platelets 01/25/2013 232  150 - 400 K/uL Final  . Sodium 01/25/2013 139  135 - 145 mEq/L Final  . Potassium 01/25/2013 4.1  3.5 - 5.1 mEq/L Final  . Chloride 01/25/2013 104  96 - 112 mEq/L Final  . CO2 01/25/2013 25  19 - 32 mEq/L Final  . Glucose, Bld 01/25/2013 92  70 - 99 mg/dL Final  . BUN 14/78/2956 15  6 - 23 mg/dL Final  . Creatinine, Ser 01/25/2013 1.06  0.50 - 1.35 mg/dL Final  . Calcium 21/30/8657 9.2  8.4 - 10.5 mg/dL Final  . Total Protein 01/25/2013 7.0  6.0 - 8.3 g/dL Final  . Albumin 84/69/6295 3.9  3.5 - 5.2 g/dL Final  . AST 28/41/3244 16  0 - 37 U/L Final  . ALT 01/25/2013 12  0 - 53 U/L Final  . Alkaline Phosphatase 01/25/2013 36* 39 - 117 U/L Final  . Total Bilirubin 01/25/2013 0.3  0.3 - 1.2 mg/dL  Final  . GFR calc non Af Amer 01/25/2013 67* >90 mL/min Final  . GFR calc Af Amer 01/25/2013 78* >90 mL/min Final   Comment: (NOTE)                          The eGFR has been calculated using the CKD EPI equation.                          This calculation has not been validated in all clinical situations.                          eGFR's persistently <90 mL/min signify possible Chronic Kidney                          Disease.  Marland Kitchen Prothrombin Time 01/25/2013 12.5  11.6 - 15.2 seconds Final  . INR 01/25/2013 0.95  0.00 - 1.49 Final  . ABO/RH(D) 01/25/2013 A NEG   Final  . Antibody Screen 01/25/2013 NEG   Final  . Sample Expiration 01/25/2013 02/08/2013   Final  . Color, Urine 01/25/2013 YELLOW  YELLOW Final  . APPearance 01/25/2013 CLEAR  CLEAR Final  . Specific Gravity, Urine 01/25/2013 1.014  1.005 - 1.030 Final  .  pH 01/25/2013 6.5  5.0 - 8.0 Final  . Glucose, UA 01/25/2013 NEGATIVE  NEGATIVE mg/dL Final  . Hgb urine dipstick 01/25/2013 SMALL* NEGATIVE Final  . Bilirubin Urine 01/25/2013 NEGATIVE  NEGATIVE Final  . Ketones, ur 01/25/2013 NEGATIVE  NEGATIVE mg/dL Final  . Protein, ur 82/95/6213 NEGATIVE  NEGATIVE mg/dL Final  . Urobilinogen, UA 01/25/2013 0.2  0.0 - 1.0 mg/dL Final  . Nitrite 08/65/7846 NEGATIVE  NEGATIVE Final  . Leukocytes, UA 01/25/2013 NEGATIVE  NEGATIVE Final  . Squamous Epithelial / LPF 01/25/2013 RARE  RARE Final  . RBC / HPF 01/25/2013 0-2  <3 RBC/hpf Final  . ABO/RH(D) 01/25/2013 A NEG   Final  Office Visit on 12/13/2012  Component Date Value Range Status  . WBC 12/13/2012 6.3  4.5 - 10.5 K/uL Final  . RBC 12/13/2012 4.19* 4.22 - 5.81 Mil/uL Final  . Hemoglobin 12/13/2012 13.4  13.0 - 17.0 g/dL Final  . HCT 96/29/5284 39.4  39.0 - 52.0 % Final  . MCV 12/13/2012 94.0  78.0 - 100.0 fl Final  . MCHC 12/13/2012 34.0  30.0 - 36.0 g/dL Final  . RDW 13/24/4010 13.1  11.5 - 14.6 % Final  . Platelets 12/13/2012 227.0  150.0 - 400.0 K/uL Final  . Neutrophils  Relative % 12/13/2012 60.8  43.0 - 77.0 % Final  . Lymphocytes Relative 12/13/2012 25.4  12.0 - 46.0 % Final  . Monocytes Relative 12/13/2012 9.1  3.0 - 12.0 % Final  . Eosinophils Relative 12/13/2012 4.1  0.0 - 5.0 % Final  . Basophils Relative 12/13/2012 0.6  0.0 - 3.0 % Final  . Neutro Abs 12/13/2012 3.8  1.4 - 7.7 K/uL Final  . Lymphs Abs 12/13/2012 1.6  0.7 - 4.0 K/uL Final  . Monocytes Absolute 12/13/2012 0.6  0.1 - 1.0 K/uL Final  . Eosinophils Absolute 12/13/2012 0.3  0.0 - 0.7 K/uL Final  . Basophils Absolute 12/13/2012 0.0  0.0 - 0.1 K/uL Final     X-Rays:Dg Hip Complete Left  01/25/2013   CLINICAL DATA:  Preop left hip surgery.  EXAM: LEFT HIP - COMPLETE 2+ VIEW  COMPARISON:  None.  FINDINGS: Advanced osteoarthritis of the left hip joint with joint space narrowing and complete cartilage loss along the weight-bearing surface of the joint. Sclerosis and spurring of the acetabulum and femoral head. Mild collapse of the femoral head. Negative for fracture.  IMPRESSION: Advanced osteoarthritis left hip.   Electronically Signed   By: Marlan Palau M.D.   On: 01/25/2013 14:17   Dg Hip Operative Left  02/03/2013   CLINICAL DATA:  Left anterior hip arthroplasty.  EXAM: OPERATIVE LEFT HIP  COMPARISON:  01/25/2013.  FINDINGS: Two frontal C arm views of the inferior pelvis and proximal left femur demonstrate an interval left total hip prosthesis. This is not included in its entirety. The visualized portions are in satisfactory position and alignment with no visible fracture or dislocation.  IMPRESSION: Interval left total hip prosthesis.   Electronically Signed   By: Gordan Payment M.D.   On: 02/03/2013 16:53   Dg Pelvis Portable  02/03/2013   CLINICAL DATA:  Status post left hip surgery  EXAM: PORTABLE PELVIS 1-2 VIEWS  COMPARISON:  None.  FINDINGS: Acetabular and femoral components of total hip prosthesis on the left appear in anticipated position. Postsurgical drain projects over the joint.  There is mild right hip arthritis.  IMPRESSION: Anticipated postoperative appearance.   Electronically Signed   By: Esperanza Heir M.D.   On:  02/03/2013 16:41    EKG: Orders placed in visit on 10/13/12  . EKG 12-LEAD     Hospital Course: Patient was admitted to [facility] and taken to the OR and underwent the above state procedure without complications.  Patient tolerated the procedure well and was later transferred to the recovery room and then to the orthopaedic floor for postoperative care.  They were given PO and IV analgesics for pain control following their surgery.  They were given 24 hours of postoperative antibiotics of  Anti-infectives   Start     Dose/Rate Route Frequency Ordered Stop   02/03/13 1900  ceFAZolin (ANCEF) IVPB 2 g/50 mL premix     2 g 100 mL/hr over 30 Minutes Intravenous Every 6 hours 02/03/13 1613 02/04/13 0117   02/03/13 0600  ceFAZolin (ANCEF) IVPB 2 g/50 mL premix     2 g 100 mL/hr over 30 Minutes Intravenous On call to O.R. 02/02/13 1501 02/03/13 1338      PT and OT were ordered for total hip protocol.  The patient was allowed to be WBAT with therapy. Discharge planning was consulted to help with postop disposition and equipment needs.  Patient had a decent night on the evening of surgery.  They started to get up OOB with therapy on day one.  Hemovac drain was pulled without difficulty.  Continued to work with therapy into day two.  Dressing was changed on day two and the incision was healing well.  Patient was seen in rounds and was ready to go home.   Activity:WBAT Follow-up:in 2 weeks Disposition - Home Discharged Condition: good      Medication List    STOP taking these medications       aspirin 81 MG tablet     multivitamin tablet      TAKE these medications       methocarbamol 500 MG tablet  Commonly known as:  ROBAXIN  Take 1 tablet (500 mg total) by mouth every 6 (six) hours as needed for muscle spasms.     oxyCODONE 5 MG  immediate release tablet  Commonly known as:  Oxy IR/ROXICODONE  Take 1-2 tablets (5-10 mg total) by mouth every 3 (three) hours as needed for breakthrough pain.     rivaroxaban 10 MG Tabs tablet  Commonly known as:  XARELTO  Take 1 tablet (10 mg total) by mouth daily with breakfast.     traMADol 50 MG tablet  Commonly known as:  ULTRAM  Take 1-2 tablets (50-100 mg total) by mouth every 6 (six) hours as needed for moderate pain.           Follow-up Information   Follow up with Loanne Drilling, MD. Schedule an appointment as soon as possible for a visit on 02/16/2013. (Call [phone] Monday to make the appointment)    Specialty:  Orthopedic Surgery   Contact information:   [address] [place] [address] [phone]       Follow up with [facility]. (home health physical therapy)    Contact information:   [address] [place] [address] [phone]       Signed: Patrica Duel 02/16/2013, 2:42 PM

## 2013-02-18 DIAGNOSIS — Z7901 Long term (current) use of anticoagulants: Secondary | ICD-10-CM | POA: Diagnosis not present

## 2013-02-18 DIAGNOSIS — IMO0001 Reserved for inherently not codable concepts without codable children: Secondary | ICD-10-CM | POA: Diagnosis not present

## 2013-02-18 DIAGNOSIS — Z96649 Presence of unspecified artificial hip joint: Secondary | ICD-10-CM | POA: Diagnosis not present

## 2013-02-18 DIAGNOSIS — Z471 Aftercare following joint replacement surgery: Secondary | ICD-10-CM | POA: Diagnosis not present

## 2013-03-16 DIAGNOSIS — Z86006 Personal history of melanoma in-situ: Secondary | ICD-10-CM

## 2013-03-16 DIAGNOSIS — C44311 Basal cell carcinoma of skin of nose: Secondary | ICD-10-CM

## 2013-03-16 HISTORY — DX: Basal cell carcinoma of skin of nose: C44.311

## 2013-03-16 HISTORY — DX: Personal history of melanoma in-situ: Z86.006

## 2013-03-21 DIAGNOSIS — M169 Osteoarthritis of hip, unspecified: Secondary | ICD-10-CM | POA: Diagnosis not present

## 2013-03-21 DIAGNOSIS — Z471 Aftercare following joint replacement surgery: Secondary | ICD-10-CM | POA: Diagnosis not present

## 2013-03-21 DIAGNOSIS — Z96649 Presence of unspecified artificial hip joint: Secondary | ICD-10-CM | POA: Diagnosis not present

## 2013-03-21 DIAGNOSIS — M1610 Unilateral primary osteoarthritis, unspecified hip: Secondary | ICD-10-CM | POA: Diagnosis not present

## 2013-05-16 DIAGNOSIS — H5210 Myopia, unspecified eye: Secondary | ICD-10-CM | POA: Diagnosis not present

## 2013-05-16 DIAGNOSIS — H52229 Regular astigmatism, unspecified eye: Secondary | ICD-10-CM | POA: Diagnosis not present

## 2013-05-16 DIAGNOSIS — H2589 Other age-related cataract: Secondary | ICD-10-CM | POA: Diagnosis not present

## 2013-05-16 DIAGNOSIS — H524 Presbyopia: Secondary | ICD-10-CM | POA: Diagnosis not present

## 2013-06-05 DIAGNOSIS — C4360 Malignant melanoma of unspecified upper limb, including shoulder: Secondary | ICD-10-CM | POA: Diagnosis not present

## 2013-06-05 DIAGNOSIS — L578 Other skin changes due to chronic exposure to nonionizing radiation: Secondary | ICD-10-CM | POA: Diagnosis not present

## 2013-06-05 DIAGNOSIS — D485 Neoplasm of uncertain behavior of skin: Secondary | ICD-10-CM | POA: Diagnosis not present

## 2013-06-05 DIAGNOSIS — Z85828 Personal history of other malignant neoplasm of skin: Secondary | ICD-10-CM | POA: Diagnosis not present

## 2013-06-05 DIAGNOSIS — I789 Disease of capillaries, unspecified: Secondary | ICD-10-CM | POA: Diagnosis not present

## 2013-06-05 DIAGNOSIS — L819 Disorder of pigmentation, unspecified: Secondary | ICD-10-CM | POA: Diagnosis not present

## 2013-06-05 DIAGNOSIS — L821 Other seborrheic keratosis: Secondary | ICD-10-CM | POA: Diagnosis not present

## 2013-07-31 DIAGNOSIS — C4360 Malignant melanoma of unspecified upper limb, including shoulder: Secondary | ICD-10-CM | POA: Diagnosis not present

## 2013-07-31 DIAGNOSIS — C44390 Other specified malignant neoplasm of skin of unspecified parts of face: Secondary | ICD-10-CM | POA: Diagnosis not present

## 2013-08-09 ENCOUNTER — Encounter: Payer: Self-pay | Admitting: Family Medicine

## 2013-08-09 DIAGNOSIS — Z86006 Personal history of melanoma in-situ: Secondary | ICD-10-CM | POA: Insufficient documentation

## 2013-12-01 IMAGING — CR DG HIP (WITH OR WITHOUT PELVIS) 2-3V*L*
3 series · 3 of 3 positions shown · non-contrast
Comparison: None.

CLINICAL DATA: Preop left hip surgery.

EXAM:
LEFT HIP - COMPLETE 2+ VIEW

[t pelvis ap]
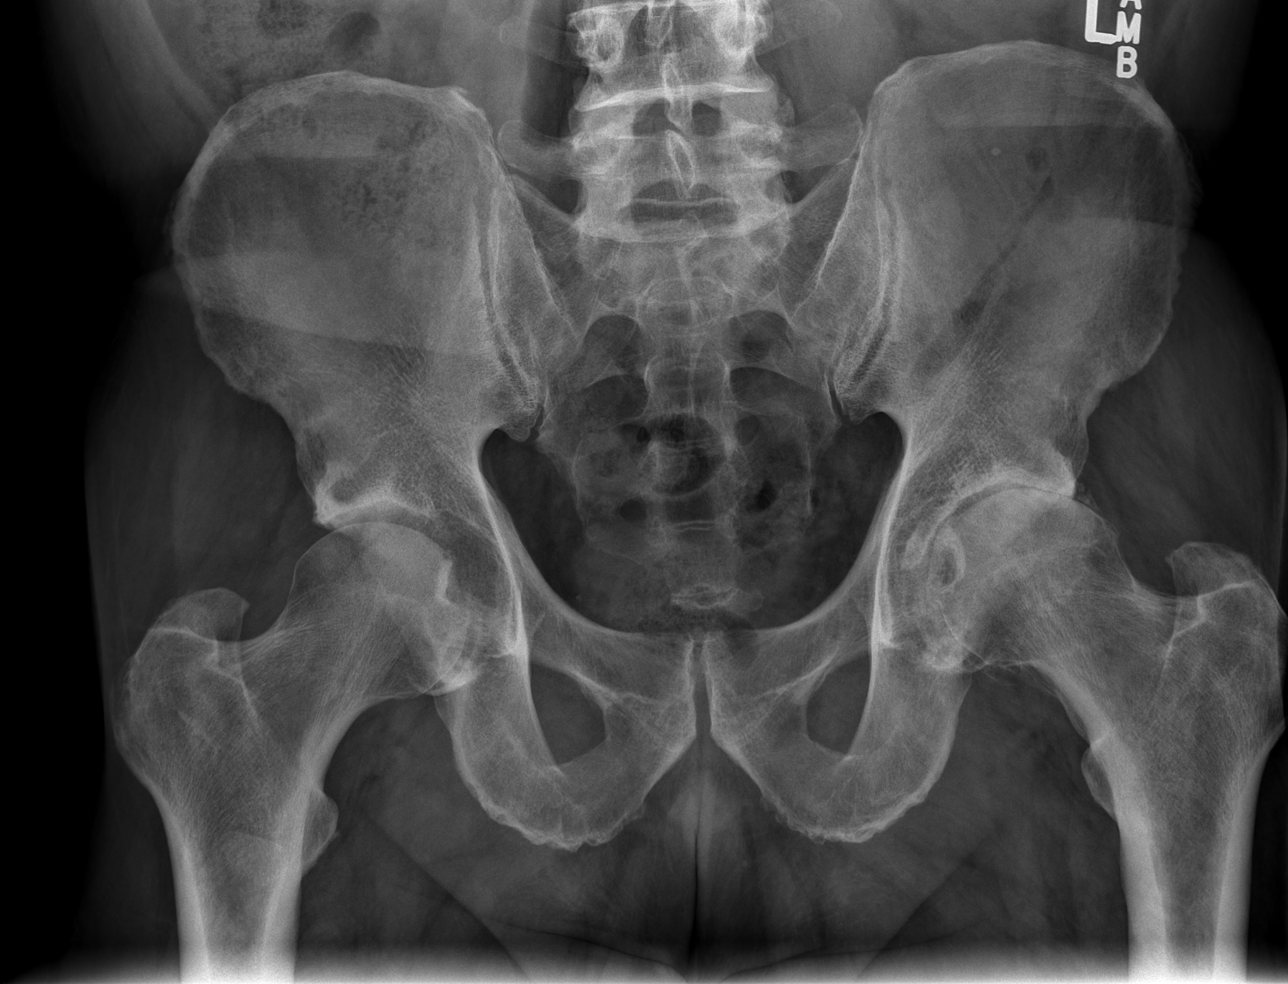

[t hip ap left]
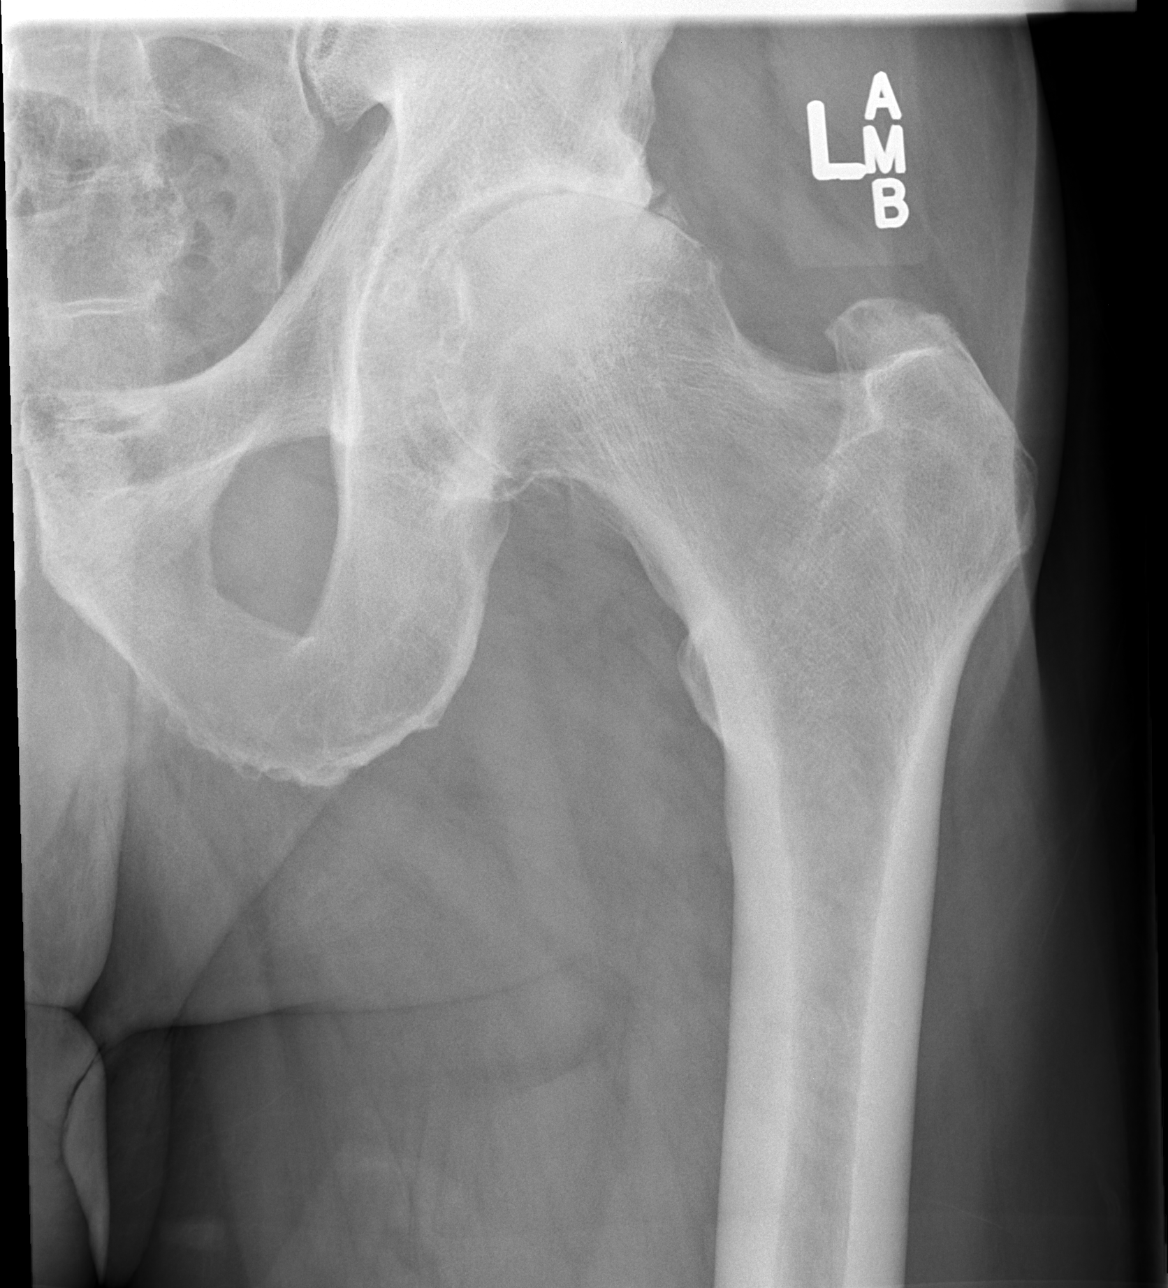

[t hip frog leg left]
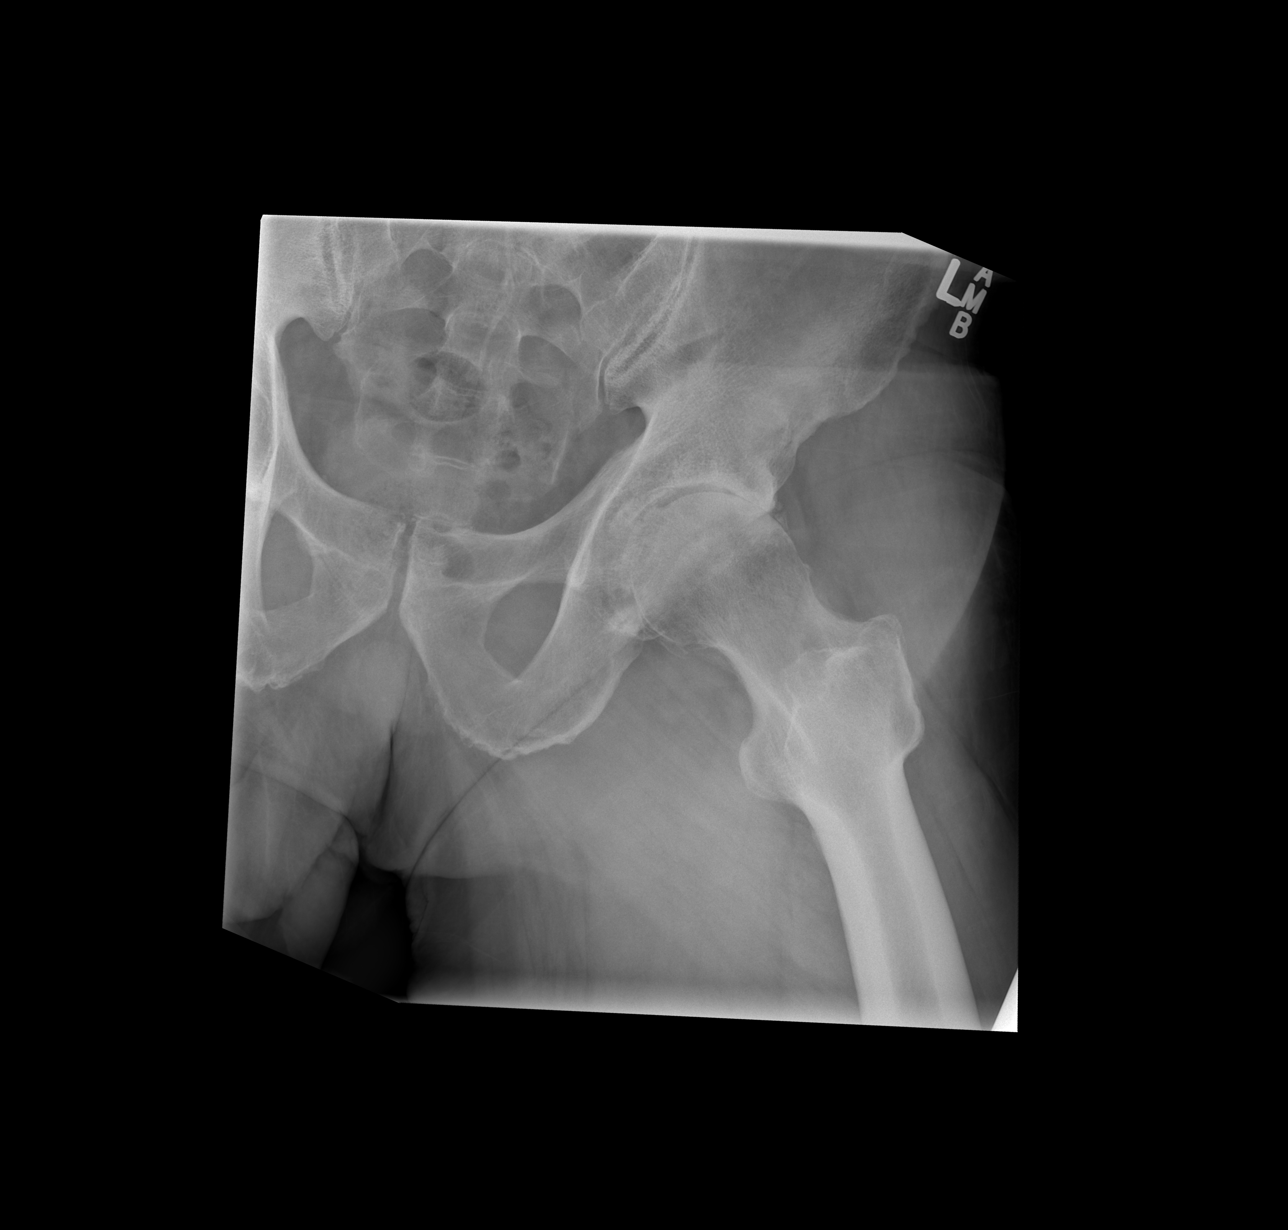

[3 of 3 positions shown; findings below may reference images not displayed]

FINDINGS: Advanced osteoarthritis of the left hip joint with joint space
narrowing and complete cartilage loss along the weight-bearing
surface of the joint. Sclerosis and spurring of the acetabulum and
femoral head. Mild collapse of the femoral head. Negative for
fracture.
IMPRESSION: Advanced osteoarthritis left hip.

## 2013-12-11 DIAGNOSIS — L820 Inflamed seborrheic keratosis: Secondary | ICD-10-CM | POA: Diagnosis not present

## 2013-12-11 DIAGNOSIS — L821 Other seborrheic keratosis: Secondary | ICD-10-CM | POA: Diagnosis not present

## 2013-12-11 DIAGNOSIS — D485 Neoplasm of uncertain behavior of skin: Secondary | ICD-10-CM | POA: Diagnosis not present

## 2013-12-11 DIAGNOSIS — Z85820 Personal history of malignant melanoma of skin: Secondary | ICD-10-CM | POA: Diagnosis not present

## 2013-12-11 DIAGNOSIS — L723 Sebaceous cyst: Secondary | ICD-10-CM | POA: Diagnosis not present

## 2013-12-11 DIAGNOSIS — C44211 Basal cell carcinoma of skin of unspecified ear and external auricular canal: Secondary | ICD-10-CM | POA: Diagnosis not present

## 2013-12-11 DIAGNOSIS — C4359 Malignant melanoma of other part of trunk: Secondary | ICD-10-CM | POA: Diagnosis not present

## 2013-12-11 DIAGNOSIS — Z1283 Encounter for screening for malignant neoplasm of skin: Secondary | ICD-10-CM | POA: Diagnosis not present

## 2013-12-11 DIAGNOSIS — Z85828 Personal history of other malignant neoplasm of skin: Secondary | ICD-10-CM | POA: Diagnosis not present

## 2013-12-11 DIAGNOSIS — L578 Other skin changes due to chronic exposure to nonionizing radiation: Secondary | ICD-10-CM | POA: Diagnosis not present

## 2014-01-22 DIAGNOSIS — L859 Epidermal thickening, unspecified: Secondary | ICD-10-CM | POA: Diagnosis not present

## 2014-01-22 DIAGNOSIS — L720 Epidermal cyst: Secondary | ICD-10-CM | POA: Diagnosis not present

## 2014-01-22 DIAGNOSIS — L814 Other melanin hyperpigmentation: Secondary | ICD-10-CM | POA: Diagnosis not present

## 2014-01-22 DIAGNOSIS — L821 Other seborrheic keratosis: Secondary | ICD-10-CM | POA: Diagnosis not present

## 2014-01-22 DIAGNOSIS — L578 Other skin changes due to chronic exposure to nonionizing radiation: Secondary | ICD-10-CM | POA: Diagnosis not present

## 2014-01-22 DIAGNOSIS — L728 Other follicular cysts of the skin and subcutaneous tissue: Secondary | ICD-10-CM | POA: Diagnosis not present

## 2014-01-22 DIAGNOSIS — D1800 Hemangioma unspecified site: Secondary | ICD-10-CM | POA: Diagnosis not present

## 2014-01-22 DIAGNOSIS — D485 Neoplasm of uncertain behavior of skin: Secondary | ICD-10-CM | POA: Diagnosis not present

## 2014-02-05 DIAGNOSIS — C44311 Basal cell carcinoma of skin of nose: Secondary | ICD-10-CM | POA: Diagnosis not present

## 2014-02-05 DIAGNOSIS — D0359 Melanoma in situ of other part of trunk: Secondary | ICD-10-CM | POA: Diagnosis not present

## 2014-02-05 DIAGNOSIS — D039 Melanoma in situ, unspecified: Secondary | ICD-10-CM | POA: Diagnosis not present

## 2014-02-13 DIAGNOSIS — Z96642 Presence of left artificial hip joint: Secondary | ICD-10-CM | POA: Diagnosis not present

## 2014-02-13 DIAGNOSIS — Z471 Aftercare following joint replacement surgery: Secondary | ICD-10-CM | POA: Diagnosis not present

## 2014-02-20 ENCOUNTER — Encounter: Payer: Self-pay | Admitting: Family Medicine

## 2014-04-23 DIAGNOSIS — L814 Other melanin hyperpigmentation: Secondary | ICD-10-CM | POA: Diagnosis not present

## 2014-04-23 DIAGNOSIS — L820 Inflamed seborrheic keratosis: Secondary | ICD-10-CM | POA: Diagnosis not present

## 2014-04-23 DIAGNOSIS — L821 Other seborrheic keratosis: Secondary | ICD-10-CM | POA: Diagnosis not present

## 2014-04-23 DIAGNOSIS — Z85828 Personal history of other malignant neoplasm of skin: Secondary | ICD-10-CM | POA: Diagnosis not present

## 2014-04-23 DIAGNOSIS — D1800 Hemangioma unspecified site: Secondary | ICD-10-CM | POA: Diagnosis not present

## 2014-04-23 DIAGNOSIS — Z1283 Encounter for screening for malignant neoplasm of skin: Secondary | ICD-10-CM | POA: Diagnosis not present

## 2014-04-23 DIAGNOSIS — L720 Epidermal cyst: Secondary | ICD-10-CM | POA: Diagnosis not present

## 2014-04-23 DIAGNOSIS — L578 Other skin changes due to chronic exposure to nonionizing radiation: Secondary | ICD-10-CM | POA: Diagnosis not present

## 2014-04-23 DIAGNOSIS — Z85820 Personal history of malignant melanoma of skin: Secondary | ICD-10-CM | POA: Diagnosis not present

## 2014-04-23 DIAGNOSIS — I788 Other diseases of capillaries: Secondary | ICD-10-CM | POA: Diagnosis not present

## 2014-04-23 DIAGNOSIS — L570 Actinic keratosis: Secondary | ICD-10-CM | POA: Diagnosis not present

## 2014-05-21 DIAGNOSIS — L720 Epidermal cyst: Secondary | ICD-10-CM | POA: Diagnosis not present

## 2014-06-05 DIAGNOSIS — H5213 Myopia, bilateral: Secondary | ICD-10-CM | POA: Diagnosis not present

## 2014-06-05 DIAGNOSIS — H524 Presbyopia: Secondary | ICD-10-CM | POA: Diagnosis not present

## 2014-06-05 DIAGNOSIS — H2513 Age-related nuclear cataract, bilateral: Secondary | ICD-10-CM | POA: Diagnosis not present

## 2014-06-05 DIAGNOSIS — H52223 Regular astigmatism, bilateral: Secondary | ICD-10-CM | POA: Diagnosis not present

## 2014-06-21 DIAGNOSIS — D0359 Melanoma in situ of other part of trunk: Secondary | ICD-10-CM | POA: Diagnosis not present

## 2014-06-21 DIAGNOSIS — C44311 Basal cell carcinoma of skin of nose: Secondary | ICD-10-CM | POA: Diagnosis not present

## 2014-07-22 ENCOUNTER — Encounter: Payer: Self-pay | Admitting: Family Medicine

## 2014-07-22 DIAGNOSIS — Z7189 Other specified counseling: Secondary | ICD-10-CM | POA: Insufficient documentation

## 2014-08-14 DIAGNOSIS — H18411 Arcus senilis, right eye: Secondary | ICD-10-CM | POA: Diagnosis not present

## 2014-08-14 DIAGNOSIS — H2511 Age-related nuclear cataract, right eye: Secondary | ICD-10-CM | POA: Diagnosis not present

## 2014-08-14 DIAGNOSIS — H2512 Age-related nuclear cataract, left eye: Secondary | ICD-10-CM | POA: Diagnosis not present

## 2014-08-14 DIAGNOSIS — H18412 Arcus senilis, left eye: Secondary | ICD-10-CM | POA: Diagnosis not present

## 2014-10-15 HISTORY — PX: CATARACT EXTRACTION: SUR2

## 2014-10-30 DIAGNOSIS — D1800 Hemangioma unspecified site: Secondary | ICD-10-CM | POA: Diagnosis not present

## 2014-10-30 DIAGNOSIS — L570 Actinic keratosis: Secondary | ICD-10-CM | POA: Diagnosis not present

## 2014-10-30 DIAGNOSIS — L578 Other skin changes due to chronic exposure to nonionizing radiation: Secondary | ICD-10-CM | POA: Diagnosis not present

## 2014-10-30 DIAGNOSIS — Z85820 Personal history of malignant melanoma of skin: Secondary | ICD-10-CM | POA: Diagnosis not present

## 2014-10-30 DIAGNOSIS — L820 Inflamed seborrheic keratosis: Secondary | ICD-10-CM | POA: Diagnosis not present

## 2014-10-30 DIAGNOSIS — Z1283 Encounter for screening for malignant neoplasm of skin: Secondary | ICD-10-CM | POA: Diagnosis not present

## 2014-10-30 DIAGNOSIS — L821 Other seborrheic keratosis: Secondary | ICD-10-CM | POA: Diagnosis not present

## 2014-10-30 DIAGNOSIS — C44619 Basal cell carcinoma of skin of left upper limb, including shoulder: Secondary | ICD-10-CM | POA: Diagnosis not present

## 2014-10-30 DIAGNOSIS — C4491 Basal cell carcinoma of skin, unspecified: Secondary | ICD-10-CM

## 2014-10-30 DIAGNOSIS — D229 Melanocytic nevi, unspecified: Secondary | ICD-10-CM | POA: Diagnosis not present

## 2014-10-30 DIAGNOSIS — L905 Scar conditions and fibrosis of skin: Secondary | ICD-10-CM | POA: Diagnosis not present

## 2014-10-30 DIAGNOSIS — Z85828 Personal history of other malignant neoplasm of skin: Secondary | ICD-10-CM | POA: Diagnosis not present

## 2014-10-30 HISTORY — DX: Basal cell carcinoma of skin, unspecified: C44.91

## 2014-11-06 DIAGNOSIS — H2512 Age-related nuclear cataract, left eye: Secondary | ICD-10-CM | POA: Diagnosis not present

## 2014-11-07 DIAGNOSIS — H25041 Posterior subcapsular polar age-related cataract, right eye: Secondary | ICD-10-CM | POA: Diagnosis not present

## 2014-11-07 DIAGNOSIS — H25011 Cortical age-related cataract, right eye: Secondary | ICD-10-CM | POA: Diagnosis not present

## 2014-11-07 DIAGNOSIS — H2511 Age-related nuclear cataract, right eye: Secondary | ICD-10-CM | POA: Diagnosis not present

## 2014-11-13 DIAGNOSIS — H2511 Age-related nuclear cataract, right eye: Secondary | ICD-10-CM | POA: Diagnosis not present

## 2014-11-20 DIAGNOSIS — H2512 Age-related nuclear cataract, left eye: Secondary | ICD-10-CM | POA: Diagnosis not present

## 2015-05-06 DIAGNOSIS — L821 Other seborrheic keratosis: Secondary | ICD-10-CM | POA: Diagnosis not present

## 2015-05-06 DIAGNOSIS — Z85828 Personal history of other malignant neoplasm of skin: Secondary | ICD-10-CM | POA: Diagnosis not present

## 2015-05-06 DIAGNOSIS — L728 Other follicular cysts of the skin and subcutaneous tissue: Secondary | ICD-10-CM | POA: Diagnosis not present

## 2015-05-06 DIAGNOSIS — C4441 Basal cell carcinoma of skin of scalp and neck: Secondary | ICD-10-CM | POA: Diagnosis not present

## 2015-05-06 DIAGNOSIS — D229 Melanocytic nevi, unspecified: Secondary | ICD-10-CM | POA: Diagnosis not present

## 2015-05-06 DIAGNOSIS — D1800 Hemangioma unspecified site: Secondary | ICD-10-CM | POA: Diagnosis not present

## 2015-05-06 DIAGNOSIS — C44519 Basal cell carcinoma of skin of other part of trunk: Secondary | ICD-10-CM | POA: Diagnosis not present

## 2015-05-06 DIAGNOSIS — Z85820 Personal history of malignant melanoma of skin: Secondary | ICD-10-CM | POA: Diagnosis not present

## 2015-05-06 DIAGNOSIS — L820 Inflamed seborrheic keratosis: Secondary | ICD-10-CM | POA: Diagnosis not present

## 2015-05-06 DIAGNOSIS — Z1283 Encounter for screening for malignant neoplasm of skin: Secondary | ICD-10-CM | POA: Diagnosis not present

## 2015-05-06 DIAGNOSIS — L570 Actinic keratosis: Secondary | ICD-10-CM | POA: Diagnosis not present

## 2015-05-06 DIAGNOSIS — L720 Epidermal cyst: Secondary | ICD-10-CM | POA: Diagnosis not present

## 2015-07-04 ENCOUNTER — Ambulatory Visit (INDEPENDENT_AMBULATORY_CARE_PROVIDER_SITE_OTHER): Payer: Medicare Other

## 2015-07-04 VITALS — BP 106/60 | HR 68 | Temp 98.0°F | Ht 70.75 in | Wt 202.2 lb

## 2015-07-04 DIAGNOSIS — Z23 Encounter for immunization: Secondary | ICD-10-CM | POA: Diagnosis not present

## 2015-07-04 DIAGNOSIS — Z0000 Encounter for general adult medical examination without abnormal findings: Secondary | ICD-10-CM

## 2015-07-04 DIAGNOSIS — Z125 Encounter for screening for malignant neoplasm of prostate: Secondary | ICD-10-CM | POA: Diagnosis not present

## 2015-07-04 DIAGNOSIS — R7309 Other abnormal glucose: Secondary | ICD-10-CM | POA: Diagnosis not present

## 2015-07-04 DIAGNOSIS — E7800 Pure hypercholesterolemia, unspecified: Secondary | ICD-10-CM

## 2015-07-04 DIAGNOSIS — D649 Anemia, unspecified: Secondary | ICD-10-CM

## 2015-07-04 LAB — LIPID PANEL
Cholesterol: 186 mg/dL (ref 0–200)
HDL: 49.6 mg/dL (ref >39.00)
LDL Cholesterol: 122 mg/dL — ABNORMAL HIGH (ref 0–99)
NonHDL: 136.49
Total CHOL/HDL Ratio: 4
Triglycerides: 71 mg/dL (ref 0.0–149.0)
VLDL: 14.2 mg/dL (ref 0.0–40.0)

## 2015-07-04 LAB — COMPREHENSIVE METABOLIC PANEL
ALT: 16 U/L (ref 0–53)
AST: 22 U/L (ref 0–37)
Albumin: 4 g/dL (ref 3.5–5.2)
Alkaline Phosphatase: 38 U/L — ABNORMAL LOW (ref 39–117)
BUN: 18 mg/dL (ref 6–23)
CO2: 28 mEq/L (ref 19–32)
Calcium: 9.5 mg/dL (ref 8.4–10.5)
Chloride: 105 mEq/L (ref 96–112)
Creatinine, Ser: 1.02 mg/dL (ref 0.40–1.50)
GFR: 75.21 mL/min (ref >60.00)
Glucose, Bld: 103 mg/dL — ABNORMAL HIGH (ref 70–99)
Potassium: 4.7 mEq/L (ref 3.5–5.1)
Sodium: 139 mEq/L (ref 135–145)
Total Bilirubin: 0.4 mg/dL (ref 0.2–1.2)
Total Protein: 6.5 g/dL (ref 6.0–8.3)

## 2015-07-04 LAB — CBC WITH DIFFERENTIAL/PLATELET
Basophils Absolute: 0 10*3/uL (ref 0.0–0.1)
Basophils Relative: 0.6 % (ref 0.0–3.0)
Eosinophils Absolute: 0.1 10*3/uL (ref 0.0–0.7)
Eosinophils Relative: 2.4 % (ref 0.0–5.0)
HCT: 40.3 % (ref 39.0–52.0)
Hemoglobin: 13.8 g/dL (ref 13.0–17.0)
Lymphocytes Relative: 22.2 % (ref 12.0–46.0)
Lymphs Abs: 1.4 10*3/uL (ref 0.7–4.0)
MCHC: 34.3 g/dL (ref 30.0–36.0)
MCV: 92.1 fl (ref 78.0–100.0)
Monocytes Absolute: 0.6 10*3/uL (ref 0.1–1.0)
Monocytes Relative: 9.1 % (ref 3.0–12.0)
Neutro Abs: 4.2 10*3/uL (ref 1.4–7.7)
Neutrophils Relative %: 65.7 % (ref 43.0–77.0)
Platelets: 230 10*3/uL (ref 150.0–400.0)
RBC: 4.38 Mil/uL (ref 4.22–5.81)
RDW: 13.6 % (ref 11.5–15.5)
WBC: 6.3 10*3/uL (ref 4.0–10.5)

## 2015-07-04 LAB — PSA, MEDICARE: PSA: 2.9 ng/ml (ref 0.10–4.00)

## 2015-07-04 NOTE — Progress Notes (Signed)
Subjective:   Samuel Matthews is a 75 y.o. male who presents for Medicare Annual/Subsequent preventive examination.  Cardiac Risk Factors include: advanced age (>30men, >67 women);male gender;dyslipidemia     Objective:    Vitals: BP 106/60 mmHg  Pulse 68  Temp(Src) 98 F (36.7 C) (Oral)  Ht 5' 10.75" (1.797 m)  Wt 202 lb 4 oz (91.74 kg)  BMI 28.41 kg/m2  SpO2 95%  Body mass index is 28.41 kg/(m^2).  Tobacco History  Smoking status  . Never Smoker   Smokeless tobacco  . Never Used     Counseling given: No   Past Medical History  Diagnosis Date  . History of chicken pox   . 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 History of pneumothorax 1982    rib fracture, from a fall  . Lower back pain 2012    thought HNP, improved with PT  . Osteoarthritis of left hip   . Cancer (HCC)     Hx: of squamous cell left calf  . Pneumothorax, acute     Hx: of  . 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 Basal cell carcinoma of nose 2015    ([name])  . Diverticulosis 2005    by colonoscopy   Past Surgical History  Procedure Laterality Date  . Hemorrhoid surgery  1968  . Skin cancer excision  1993, 2014    Left calf  . Chest tube insertion  1982  . Trigger finger release  2001  . Colonoscopy  07/2003    diverticulosis, int hem ([facility])  . 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 Total hip arthroplasty Left 02/03/2013    Procedure: TOTAL HIP ARTHROPLASTY ANTERIOR APPROACH;  Surgeon: [name], MD;  Location: [place];  Service: Orthopedics;  Laterality: Left;   Family History  Problem Relation Age of Onset  . Coronary artery disease Mother 74  . Hypertension Father   . Diabetes Neg Hx   . Cancer Neg Hx    History  Sexual Activity  . Sexual Activity: No    Outpatient Encounter Prescriptions as of 07/04/2015  Medication Sig  . Multiple Vitamin (MULTIVITAMIN) tablet Take 1 tablet by mouth  daily.  . [DISCONTINUED] methocarbamol (ROBAXIN) 500 MG tablet Take 1 tablet (500 mg total) by mouth every 6 (six) hours as needed for muscle spasms.  . [DISCONTINUED] oxyCODONE (OXY IR/ROXICODONE) 5 MG immediate release tablet Take 1-2 tablets (5-10 mg total) by mouth every 3 (three) hours as needed for breakthrough pain.  . [DISCONTINUED] rivaroxaban (XARELTO) 10 MG TABS tablet Take 1 tablet (10 mg total) by mouth daily with breakfast.  . [DISCONTINUED] traMADol (ULTRAM) 50 MG tablet Take 1-2 tablets (50-100 mg total) by mouth every 6 (six) hours as needed for moderate pain.   No facility-administered encounter medications on file as of 07/04/2015.    Activities of Daily Living In your present state of health, do you have any difficulty performing the following activities: 07/04/2015  Hearing? N  Vision? N  Difficulty concentrating or making decisions? N  Walking or climbing stairs? N  Dressing or bathing? N  Doing errands, shopping? N  Preparing Food and eating ? N  Using the Toilet? N  In the past six months, have you accidently leaked urine? N  Do you have problems with loss of bowel control? N  Managing your Medications? N  Managing your Finances? N  Housekeeping or managing your Housekeeping? N    Patient Care Team: Ria Bush, MD as PCP - General (Family Medicine)   Assessment:      Hearing Screening   125Hz  250Hz  500Hz  1000Hz  2000Hz  4000Hz  8000Hz   Right ear:   0 0 40 0   Left ear:   0 40 40 0   Vision Screening Comments: Last eye exam Sept 2016   Exercise Activities and Dietary recommendations Current Exercise Habits: Home exercise routine, Type of exercise: strength training/weights;Other - see comments (golfing, ab exercises), Time (Minutes): 60 (golfing for 4 hrs 3 days per week), Frequency (Times/Week): 2, Weekly Exercise (Minutes/Week): 120, Intensity: Moderate, Exercise limited by: None identified  Goals    . sleep managment     Starting 07/10/2015, I will  attempt to stay up longer and sleep in longer.       Fall Risk Fall Risk  07/04/2015  Falls in the past year? No   Depression Screen PHQ 2/9 Scores 07/04/2015 04/15/2011  PHQ - 2 Score 0 0    Cognitive Testing MMSE - Mini Mental State Exam 07/04/2015  Orientation to time 5  Orientation to Place 5  Registration 3  Attention/ Calculation 0  Recall 3  Language- name 2 objects 0  Language- repeat 1  Language- follow 3 step command 3  Language- read & follow direction 0  Write a sentence 0  Copy design 0  Total score 20   PLEASE NOTE: A Mini-Cog screen was completed. Maximum score is 20. A value of 0 denotes this part of [place] MMSE was not completed.  Orientation to Time - Max 5 Orientation to Place - Max 5 Registration - Max 3 Recall - Max 3 Language Repeat - Max 1 Language Follow 3 Step Command - Max 3   Immunization History  Administered Date(s) Administered  . Influenza,inj,Quad PF,36+ Mos 12/13/2012  . Pneumococcal Conjugate-13 07/04/2015  . Pneumococcal Polysaccharide-23 10/04/2007, 04/28/2011  . Zoster 09/08/2013   Screening Tests Health Maintenance  Topic Date Due  . INFLUENZA VACCINE  10/15/2015  . TETANUS/TDAP  03/16/2018  . DTaP/Tdap/Td  Completed  . ZOSTAVAX  Completed  . PNA vac Low Risk Adult  Completed      Plan:     I have personally reviewed and addressed the Medicare Annual Wellness questionnaire and have noted the following in the patient's chart:  A. Medical and social history B. Use of alcohol, tobacco or illicit drugs  C. Current medications and supplements D. Functional ability and status E.  Nutritional status F.  Physical activity G. Advance directives H. List of other physicians I.  Hospitalizations, surgeries, and ER visits in previous 12 months J.  [place] to include hearing, vision, cognitive, depression L. Referrals and appointments - none  In addition, I have reviewed and discussed with patient certain  preventive protocols, quality metrics, and best practice recommendations. A written personalized care plan for preventive services as well as general preventive health recommendations were provided to patient.  See attached scanned questionnaire for additional information.   Signed,   Lindell Noe, MHA, BS, LPN Health Advisor [id number]

## 2015-07-04 NOTE — Patient Instructions (Signed)
[name] , Thank you for taking time to come for your Medicare Wellness Visit. I appreciate your ongoing commitment to your health goals. Please review the following plan we discussed and let me know if I can assist you in the future.   These are the goals we discussed: Goals    . sleep managment     Starting 07/10/2015, I will attempt to stay up longer and sleep in longer.        This is a list of the screening recommended for you and due dates:  Health Maintenance  Topic Date Due  . Flu Shot  10/15/2015  . Tetanus Vaccine  03/16/2018  . DTaP/Tdap/Td vaccine  Completed  . Shingles Vaccine  Completed  . Pneumonia vaccines  Completed   Preventive Care for Adults  A healthy lifestyle and preventive care can promote health and wellness. Preventive health guidelines for adults include the following key practices.  . A routine yearly physical is a good way to check with your health care provider about your health and preventive screening. It is a chance to share any concerns and updates on your health and to receive a thorough exam.  . Visit your dentist for a routine exam and preventive care every 6 months. Brush your teeth twice a day and floss once a day. Good oral hygiene prevents tooth decay and gum disease.  . The frequency of eye exams is based on your age, health, family medical history, use  of contact lenses, and other factors. Follow your health care provider's ecommendations for frequency of eye exams.  . Eat a healthy diet. Foods like vegetables, fruits, whole grains, low-fat dairy products, and lean protein foods contain the nutrients you need without too many calories. Decrease your intake of foods high in solid fats, added sugars, and salt. Eat the right amount of calories for you. Get information about a proper diet from your health care provider, if necessary.  . Regular physical exercise is one of the most important things you can do for your health. Most adults should  get at least 150 minutes of moderate-intensity exercise (any activity that increases your heart rate and causes you to sweat) each week. In addition, most adults need muscle-strengthening exercises on 2 or more days a week.  Silver Sneakers may be a benefit available to you. To determine eligibility, you may visit the website: www.silversneakers.com or contact program at (423) 667-8392 Mon-Fri between 8AM-8PM.   . Maintain a healthy weight. The body mass index (BMI) is a screening tool to identify possible weight problems. It provides an estimate of body fat based on height and weight. Your health care provider can find your BMI and can help you achieve or maintain a healthy weight.   For adults 20 years and older: ? A BMI below 18.5 is considered underweight. ? A BMI of 18.5 to 24.9 is normal. ? A BMI of 25 to 29.9 is considered overweight. ? A BMI of 30 and above is considered obese.   . Maintain normal blood lipids and cholesterol levels by exercising and minimizing your intake of saturated fat. Eat a balanced diet with plenty of fruit and vegetables. Blood tests for lipids and cholesterol should begin at age 107 and be repeated every 5 years. If your lipid or cholesterol levels are high, you are over 50, or you are at high risk for heart disease, you may need your cholesterol levels checked more frequently. Ongoing high lipid and cholesterol levels should be treated  with medicines if diet and exercise are not working.  . If you smoke, find out from your health care provider how to quit. If you do not use tobacco, please do not start.  . If you choose to drink alcohol, please do not consume more than 2 drinks per day. One drink is considered to be 12 ounces (355 mL) of beer, 5 ounces (148 mL) of wine, or 1.5 ounces (44 mL) of liquor.  . If you are 65-23 years old, ask your health care provider if you should take aspirin to prevent strokes.  . Use sunscreen. Apply sunscreen liberally and  repeatedly throughout the day. You should seek shade when your shadow is shorter than you. Protect yourself by wearing long sleeves, pants, a wide-brimmed hat, and sunglasses year round, whenever you are outdoors.  . Once a month, do a whole body skin exam, using a mirror to look at the skin on your back. Tell your health care provider of new moles, moles that have irregular borders, moles that are larger than a pencil eraser, or moles that have changed in shape or color.

## 2015-07-04 NOTE — Progress Notes (Signed)
Pre visit review using our clinic review tool, if applicable. No additional management support is needed unless otherwise documented below in the visit note. 

## 2015-07-05 NOTE — Progress Notes (Signed)
I reviewed health advisor's note, was available for consultation, and agree with documentation and plan.  

## 2015-07-16 DIAGNOSIS — D485 Neoplasm of uncertain behavior of skin: Secondary | ICD-10-CM | POA: Diagnosis not present

## 2015-07-16 DIAGNOSIS — Z85820 Personal history of malignant melanoma of skin: Secondary | ICD-10-CM | POA: Diagnosis not present

## 2015-07-16 DIAGNOSIS — L578 Other skin changes due to chronic exposure to nonionizing radiation: Secondary | ICD-10-CM | POA: Diagnosis not present

## 2015-07-16 DIAGNOSIS — Z85828 Personal history of other malignant neoplasm of skin: Secondary | ICD-10-CM | POA: Diagnosis not present

## 2015-07-16 DIAGNOSIS — L720 Epidermal cyst: Secondary | ICD-10-CM | POA: Diagnosis not present

## 2015-07-16 DIAGNOSIS — L814 Other melanin hyperpigmentation: Secondary | ICD-10-CM | POA: Diagnosis not present

## 2015-07-17 ENCOUNTER — Ambulatory Visit (INDEPENDENT_AMBULATORY_CARE_PROVIDER_SITE_OTHER): Payer: Medicare Other | Admitting: Family Medicine

## 2015-07-17 ENCOUNTER — Encounter: Payer: Self-pay | Admitting: Family Medicine

## 2015-07-17 VITALS — BP 128/78 | HR 72 | Temp 97.7°F | Wt 202.0 lb

## 2015-07-17 DIAGNOSIS — Z7189 Other specified counseling: Secondary | ICD-10-CM | POA: Diagnosis not present

## 2015-07-17 DIAGNOSIS — R42 Dizziness and giddiness: Secondary | ICD-10-CM

## 2015-07-17 NOTE — Patient Instructions (Addendum)
We will sign you up for cologuard. You are doing well today. Return as needed or in 1-2 years for next check up.

## 2015-07-17 NOTE — Assessment & Plan Note (Signed)
Received, scanned advanced directive - Samuel Matthews wife is HCPOA, does not want prolonged life support (07/2014). Ok for reversible conditions.

## 2015-07-17 NOTE — Progress Notes (Signed)
BP 128/78 mmHg  Pulse 72  Temp(Src) 97.7 F (36.5 C) (Oral)  Wt 202 lb (91.627 kg)   CC: f/u visit  Subjective:    Patient ID: Samuel Matthews, male    DOB: 1940-08-26, 75 y.o.   MRN: [id number]  HPI: DEMITRUS HARPIN is a 75 y.o. male presenting on 07/17/2015 for Annual Exam   Saw Katha Cabal last week for AMW. Note reviewed.   Noticing some lightheadedness when first stands up. Orthostatic vital signs normal today.   Vision screen 11/2014. Failed R ear hearing screen  No falls in last year. Denies anhedonia or depression.  Preventative: COLONOSCOPY Date: 07/2003 diverticulosis, int hem ([facility]). Discussed, would like cologuard Prostate- never had screening. Declines. Lung cancer screening - never smoker prevnar 2017, pneumovax 2013 Td 2010  zostavax 2015 Advanced directive - received and scanned. Peter Congo wife is HCPOA, does not want prolonged life support (07/2014). Ok for reversible conditions. Seat belt use discussed.  Sunscreen use discussed. No changing moles on skin. Sees derm regularly. 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, gym twice weekly.  Diet: fruits/vegetables daily, 2x/wk red meat, fish 1x/wk  Relevant past medical, surgical, family and social history reviewed and updated as indicated. Interim medical history since our last visit reviewed. Allergies and medications reviewed and updated. Current Outpatient Prescriptions on File Prior to Visit  Medication Sig  . Multiple Vitamin (MULTIVITAMIN) tablet Take 1 tablet by mouth daily.   No current facility-administered medications on file prior to visit.    Review of Systems Per HPI unless specifically indicated in ROS section     Objective:    BP 128/78 mmHg  Pulse 72  Temp(Src) 97.7 F (36.5 C) (Oral)   Wt 202 lb (91.627 kg)  Wt Readings from Last 3 Encounters:  07/17/15 202 lb (91.627 kg)  07/04/15 202 lb 4 oz (91.74 kg)  01/25/13 204 lb 1.6 oz (92.579 kg)    Physical Exam  Constitutional: He appears well-developed and well-nourished. No distress.  HENT:  Head: Normocephalic and atraumatic.  Mouth/Throat: Oropharynx is clear and moist. No oropharyngeal exudate.  Neck: Normal range of motion. Neck supple. Carotid bruit is not present. No thyromegaly present.  Cardiovascular: Normal rate, regular rhythm, normal heart sounds and intact distal pulses.   No murmur heard. Pulmonary/Chest: Effort normal and breath sounds normal. No respiratory distress. He has no wheezes. He has no rales.  Musculoskeletal: He exhibits no edema.  Lymphadenopathy:    He has no cervical adenopathy.  Skin: Skin is warm and dry. No rash noted.  Nursing note and vitals reviewed.  Results for orders placed or performed in visit on 07/04/15  Lipid Panel  Result Value Ref Range   Cholesterol 186 0 - 200 mg/dL   Triglycerides 71.0 0.0 - 149.0 mg/dL   HDL 49.60 >39.00 mg/dL   VLDL 14.2 0.0 - 40.0 mg/dL   LDL Cholesterol 122 (H) 0 - 99 mg/dL   Total CHOL/HDL Ratio 4    NonHDL 136.49   CBC with Differential  Result Value Ref Range   WBC 6.3 4.0 - 10.5 K/uL   RBC 4.38 4.22 - 5.81 Mil/uL   Hemoglobin 13.8 13.0 - 17.0 g/dL   HCT 40.3 39.0 - 52.0 %   MCV 92.1 78.0 - 100.0 fl  MCHC 34.3 30.0 - 36.0 g/dL   RDW 13.6 11.5 - 15.5 %   Platelets 230.0 150.0 - 400.0 K/uL   Neutrophils Relative % 65.7 43.0 - 77.0 %   Lymphocytes Relative 22.2 12.0 - 46.0 %   Monocytes Relative 9.1 3.0 - 12.0 %   Eosinophils Relative 2.4 0.0 - 5.0 %   Basophils Relative 0.6 0.0 - 3.0 %   Neutro Abs 4.2 1.4 - 7.7 K/uL   Lymphs Abs 1.4 0.7 - 4.0 K/uL   Monocytes Absolute 0.6 0.1 - 1.0 K/uL   Eosinophils Absolute 0.1 0.0 - 0.7 K/uL   Basophils Absolute 0.0 0.0 - 0.1 K/uL  Comprehensive metabolic panel  Result Value Ref Range    Sodium 139 135 - 145 mEq/L   Potassium 4.7 3.5 - 5.1 mEq/L   Chloride 105 96 - 112 mEq/L   CO2 28 19 - 32 mEq/L   Glucose, Bld 103 (H) 70 - 99 mg/dL   BUN 18 6 - 23 mg/dL   Creatinine, Ser 1.02 0.40 - 1.50 mg/dL   Total Bilirubin 0.4 0.2 - 1.2 mg/dL   Alkaline Phosphatase 38 (L) 39 - 117 U/L   AST 22 0 - 37 U/L   ALT 16 0 - 53 U/L   Total Protein 6.5 6.0 - 8.3 g/dL   Albumin 4.0 3.5 - 5.2 g/dL   Calcium 9.5 8.4 - 10.5 mg/dL   GFR 75.21 >60.00 mL/min  PSA, Medicare  Result Value Ref Range   PSA 2.90 0.10 - 4.00 ng/ml      Assessment & Plan:   Problem List Items Addressed This Visit    Advanced care planning/counseling discussion    Received, scanned advanced directive - Peter Congo wife is HCPOA, does not want prolonged life support (07/2014). Ok for reversible conditions.      Lightheadedness - Primary    Longstanding mild orthostatic liightheadedness.  Discussed importance of good hydration status as well as slowed movements. Update if persistent or worsening sxs.  Reviewed normal EKG from 2014.           Follow up plan: Return in about 1 year (around 07/16/2016), or as needed.  Ria Bush, MD

## 2015-07-17 NOTE — Assessment & Plan Note (Signed)
Longstanding mild orthostatic liightheadedness.  Discussed importance of good hydration status as well as slowed movements. Update if persistent or worsening sxs.  Reviewed normal EKG from 2014.

## 2015-07-17 NOTE — Progress Notes (Signed)
Pre visit review using our clinic review tool, if applicable. No additional management support is needed unless otherwise documented below in the visit note. 

## 2015-08-01 DIAGNOSIS — Z1211 Encounter for screening for malignant neoplasm of colon: Secondary | ICD-10-CM | POA: Diagnosis not present

## 2015-08-01 DIAGNOSIS — Z1212 Encounter for screening for malignant neoplasm of rectum: Secondary | ICD-10-CM | POA: Diagnosis not present

## 2015-08-01 LAB — COLOGUARD: Cologuard: NEGATIVE

## 2015-08-22 ENCOUNTER — Encounter: Payer: Self-pay | Admitting: Family Medicine

## 2015-11-25 DIAGNOSIS — L601 Onycholysis: Secondary | ICD-10-CM | POA: Diagnosis not present

## 2015-11-25 DIAGNOSIS — D229 Melanocytic nevi, unspecified: Secondary | ICD-10-CM | POA: Diagnosis not present

## 2015-11-25 DIAGNOSIS — L821 Other seborrheic keratosis: Secondary | ICD-10-CM | POA: Diagnosis not present

## 2015-11-25 DIAGNOSIS — D1800 Hemangioma unspecified site: Secondary | ICD-10-CM | POA: Diagnosis not present

## 2015-11-25 DIAGNOSIS — D485 Neoplasm of uncertain behavior of skin: Secondary | ICD-10-CM | POA: Diagnosis not present

## 2015-11-25 DIAGNOSIS — Z1283 Encounter for screening for malignant neoplasm of skin: Secondary | ICD-10-CM | POA: Diagnosis not present

## 2015-11-25 DIAGNOSIS — B359 Dermatophytosis, unspecified: Secondary | ICD-10-CM | POA: Diagnosis not present

## 2015-11-25 DIAGNOSIS — L720 Epidermal cyst: Secondary | ICD-10-CM | POA: Diagnosis not present

## 2015-11-25 DIAGNOSIS — L578 Other skin changes due to chronic exposure to nonionizing radiation: Secondary | ICD-10-CM | POA: Diagnosis not present

## 2015-11-25 DIAGNOSIS — D692 Other nonthrombocytopenic purpura: Secondary | ICD-10-CM | POA: Diagnosis not present

## 2015-11-25 DIAGNOSIS — Z79899 Other long term (current) drug therapy: Secondary | ICD-10-CM | POA: Diagnosis not present

## 2015-11-25 DIAGNOSIS — Z85820 Personal history of malignant melanoma of skin: Secondary | ICD-10-CM | POA: Diagnosis not present

## 2015-11-25 DIAGNOSIS — B351 Tinea unguium: Secondary | ICD-10-CM | POA: Diagnosis not present

## 2015-11-25 DIAGNOSIS — Z85828 Personal history of other malignant neoplasm of skin: Secondary | ICD-10-CM | POA: Diagnosis not present

## 2015-11-25 DIAGNOSIS — C4441 Basal cell carcinoma of skin of scalp and neck: Secondary | ICD-10-CM | POA: Diagnosis not present

## 2016-01-13 DIAGNOSIS — L821 Other seborrheic keratosis: Secondary | ICD-10-CM | POA: Diagnosis not present

## 2016-01-13 DIAGNOSIS — C4441 Basal cell carcinoma of skin of scalp and neck: Secondary | ICD-10-CM | POA: Diagnosis not present

## 2016-01-13 DIAGNOSIS — B351 Tinea unguium: Secondary | ICD-10-CM | POA: Diagnosis not present

## 2016-02-24 DIAGNOSIS — Z85828 Personal history of other malignant neoplasm of skin: Secondary | ICD-10-CM | POA: Diagnosis not present

## 2016-02-24 DIAGNOSIS — L820 Inflamed seborrheic keratosis: Secondary | ICD-10-CM | POA: Diagnosis not present

## 2016-02-24 DIAGNOSIS — B351 Tinea unguium: Secondary | ICD-10-CM | POA: Diagnosis not present

## 2016-06-02 DIAGNOSIS — L820 Inflamed seborrheic keratosis: Secondary | ICD-10-CM | POA: Diagnosis not present

## 2016-06-02 DIAGNOSIS — L578 Other skin changes due to chronic exposure to nonionizing radiation: Secondary | ICD-10-CM | POA: Diagnosis not present

## 2016-06-02 DIAGNOSIS — Z85828 Personal history of other malignant neoplasm of skin: Secondary | ICD-10-CM | POA: Diagnosis not present

## 2016-06-02 DIAGNOSIS — L918 Other hypertrophic disorders of the skin: Secondary | ICD-10-CM | POA: Diagnosis not present

## 2016-06-02 DIAGNOSIS — L900 Lichen sclerosus et atrophicus: Secondary | ICD-10-CM | POA: Diagnosis not present

## 2016-06-02 DIAGNOSIS — D1800 Hemangioma unspecified site: Secondary | ICD-10-CM | POA: Diagnosis not present

## 2016-06-02 DIAGNOSIS — D229 Melanocytic nevi, unspecified: Secondary | ICD-10-CM | POA: Diagnosis not present

## 2016-06-02 DIAGNOSIS — Z85820 Personal history of malignant melanoma of skin: Secondary | ICD-10-CM | POA: Diagnosis not present

## 2016-06-02 DIAGNOSIS — Z1283 Encounter for screening for malignant neoplasm of skin: Secondary | ICD-10-CM | POA: Diagnosis not present

## 2016-06-02 DIAGNOSIS — L821 Other seborrheic keratosis: Secondary | ICD-10-CM | POA: Diagnosis not present

## 2016-06-02 DIAGNOSIS — L814 Other melanin hyperpigmentation: Secondary | ICD-10-CM | POA: Diagnosis not present

## 2016-06-02 DIAGNOSIS — B351 Tinea unguium: Secondary | ICD-10-CM | POA: Diagnosis not present

## 2016-06-30 DIAGNOSIS — Z961 Presence of intraocular lens: Secondary | ICD-10-CM | POA: Diagnosis not present

## 2016-12-07 DIAGNOSIS — D485 Neoplasm of uncertain behavior of skin: Secondary | ICD-10-CM | POA: Diagnosis not present

## 2016-12-07 DIAGNOSIS — D1800 Hemangioma unspecified site: Secondary | ICD-10-CM | POA: Diagnosis not present

## 2016-12-07 DIAGNOSIS — L720 Epidermal cyst: Secondary | ICD-10-CM | POA: Diagnosis not present

## 2016-12-07 DIAGNOSIS — L821 Other seborrheic keratosis: Secondary | ICD-10-CM | POA: Diagnosis not present

## 2016-12-07 DIAGNOSIS — C44311 Basal cell carcinoma of skin of nose: Secondary | ICD-10-CM | POA: Diagnosis not present

## 2016-12-07 DIAGNOSIS — Z85820 Personal history of malignant melanoma of skin: Secondary | ICD-10-CM | POA: Diagnosis not present

## 2016-12-07 DIAGNOSIS — L578 Other skin changes due to chronic exposure to nonionizing radiation: Secondary | ICD-10-CM | POA: Diagnosis not present

## 2016-12-07 DIAGNOSIS — Z85828 Personal history of other malignant neoplasm of skin: Secondary | ICD-10-CM | POA: Diagnosis not present

## 2016-12-07 DIAGNOSIS — L820 Inflamed seborrheic keratosis: Secondary | ICD-10-CM | POA: Diagnosis not present

## 2016-12-07 DIAGNOSIS — D229 Melanocytic nevi, unspecified: Secondary | ICD-10-CM | POA: Diagnosis not present

## 2016-12-10 ENCOUNTER — Ambulatory Visit (INDEPENDENT_AMBULATORY_CARE_PROVIDER_SITE_OTHER): Payer: Medicare Other

## 2016-12-10 ENCOUNTER — Other Ambulatory Visit: Payer: Self-pay | Admitting: Family Medicine

## 2016-12-10 VITALS — BP 122/68 | HR 67 | Temp 97.8°F | Ht 69.75 in | Wt 210.0 lb

## 2016-12-10 DIAGNOSIS — Z131 Encounter for screening for diabetes mellitus: Secondary | ICD-10-CM

## 2016-12-10 DIAGNOSIS — Z13220 Encounter for screening for lipoid disorders: Secondary | ICD-10-CM

## 2016-12-10 DIAGNOSIS — Z0000 Encounter for general adult medical examination without abnormal findings: Secondary | ICD-10-CM | POA: Diagnosis not present

## 2016-12-10 LAB — LIPID PANEL
Cholesterol: 151 mg/dL (ref 0–200)
HDL: 47.7 mg/dL (ref >39.00)
LDL Cholesterol: 96 mg/dL (ref 0–99)
NonHDL: 103.54
Total CHOL/HDL Ratio: 3
Triglycerides: 38 mg/dL (ref 0.0–149.0)
VLDL: 7.6 mg/dL (ref 0.0–40.0)

## 2016-12-10 LAB — BASIC METABOLIC PANEL
BUN: 18 mg/dL (ref 6–23)
CO2: 27 mEq/L (ref 19–32)
Calcium: 9.4 mg/dL (ref 8.4–10.5)
Chloride: 107 mEq/L (ref 96–112)
Creatinine, Ser: 1.04 mg/dL (ref 0.40–1.50)
GFR: 73.27 mL/min (ref >60.00)
Glucose, Bld: 96 mg/dL (ref 70–99)
Potassium: 4.1 mEq/L (ref 3.5–5.1)
Sodium: 139 mEq/L (ref 135–145)

## 2016-12-10 NOTE — Patient Instructions (Signed)
Samuel Matthews , Thank you for taking time to come for your Medicare Wellness Visit. I appreciate your ongoing commitment to your health goals. Please review the following plan we discussed and let me know if I can assist you in the future.   These are the goals we discussed: Goals    . Increase physical activity          Starting 12/10/2016, I will continue to play golf at least twice weekly and to exercise for 30-45 min at gym twice weekly.        This is a list of the screening recommended for you and due dates:  Health Maintenance  Topic Date Due  . Flu Shot  06/13/2017*  . DTaP/Tdap/Td vaccine (1 - Tdap) 03/16/2018*  . Tetanus Vaccine  03/16/2018  . Pneumonia vaccines  Completed  *Topic was postponed. The date shown is not the original due date.   Preventive Care for Adults  A healthy lifestyle and preventive care can promote health and wellness. Preventive health guidelines for adults include the following key practices.  . A routine yearly physical is a good way to check with your health care provider about your health and preventive screening. It is a chance to share any concerns and updates on your health and to receive a thorough exam.  . Visit your dentist for a routine exam and preventive care every 6 months. Brush your teeth twice a day and floss once a day. Good oral hygiene prevents tooth decay and gum disease.  . The frequency of eye exams is based on your age, health, family medical history, use  of contact lenses, and other factors. Follow your health care provider's ecommendations for frequency of eye exams.  . Eat a healthy diet. Foods like vegetables, fruits, whole grains, low-fat dairy products, and lean protein foods contain the nutrients you need without too many calories. Decrease your intake of foods high in solid fats, added sugars, and salt. Eat the right amount of calories for you. Get information about a proper diet from your health care provider, if  necessary.  . Regular physical exercise is one of the most important things you can do for your health. Most adults should get at least 150 minutes of moderate-intensity exercise (any activity that increases your heart rate and causes you to sweat) each week. In addition, most adults need muscle-strengthening exercises on 2 or more days a week.  Silver Sneakers may be a benefit available to you. To determine eligibility, you may visit the website: www.silversneakers.com or contact program at 951-635-7484 Mon-Fri between 8AM-8PM.   . Maintain a healthy weight. The body mass index (BMI) is a screening tool to identify possible weight problems. It provides an estimate of body fat based on height and weight. Your health care provider can find your BMI and can help you achieve or maintain a healthy weight.   For adults 20 years and older: ? A BMI below 18.5 is considered underweight. ? A BMI of 18.5 to 24.9 is normal. ? A BMI of 25 to 29.9 is considered overweight. ? A BMI of 30 and above is considered obese.   . Maintain normal blood lipids and cholesterol levels by exercising and minimizing your intake of saturated fat. Eat a balanced diet with plenty of fruit and vegetables. Blood tests for lipids and cholesterol should begin at age 18 and be repeated every 5 years. If your lipid or cholesterol levels are high, you are over 50, or you are  at high risk for heart disease, you may need your cholesterol levels checked more frequently. Ongoing high lipid and cholesterol levels should be treated with medicines if diet and exercise are not working.  . If you smoke, find out from your health care provider how to quit. If you do not use tobacco, please do not start.  . If you choose to drink alcohol, please do not consume more than 2 drinks per day. One drink is considered to be 12 ounces (355 mL) of beer, 5 ounces (148 mL) of wine, or 1.5 ounces (44 mL) of liquor.  . If you are 52-91 years old, ask your  health care provider if you should take aspirin to prevent strokes.  . Use sunscreen. Apply sunscreen liberally and repeatedly throughout the day. You should seek shade when your shadow is shorter than you. Protect yourself by wearing long sleeves, pants, a wide-brimmed hat, and sunglasses year round, whenever you are outdoors.  . Once a month, do a whole body skin exam, using a mirror to look at the skin on your back. Tell your health care provider of new moles, moles that have irregular borders, moles that are larger than a pencil eraser, or moles that have changed in shape or color.

## 2016-12-10 NOTE — Progress Notes (Signed)
Pre visit review using our clinic review tool, if applicable. No additional management support is needed unless otherwise documented below in the visit note. 

## 2016-12-10 NOTE — Progress Notes (Signed)
PCP notes:   Health maintenance:  Flu vaccine - addressed; pt declined at this time  Abnormal screenings:   Hearing - failed  Hearing Screening   125Hz  250Hz  500Hz  1000Hz  2000Hz  3000Hz  4000Hz  6000Hz  8000Hz   Right ear:   40 40 40  0    Left ear:   40 40 40  0     Patient concerns:   None  Nurse concerns:  None  Next PCP appt:   12/11/16 @ 0730

## 2016-12-10 NOTE — Progress Notes (Signed)
Subjective:   Samuel Matthews is a 76 y.o. male who presents for Medicare Annual/Subsequent preventive examination.  Review of Systems:  N/A Cardiac Risk Factors include: advanced age (>78men, >66 women);male gender;dyslipidemia     Objective:    Vitals: BP 122/68 (BP Location: Right Arm, Patient Position: Sitting, Cuff Size: Normal)   Pulse 67   Temp 97.8 F (36.6 C) (Oral)   Ht 5' 9.75" (1.772 m) Comment: no shoes  Wt 210 lb (95.3 kg)   SpO2 93%   BMI 30.35 kg/m   Body mass index is 30.35 kg/m.  Tobacco History  Smoking Status  . Never Smoker  Smokeless Tobacco  . Never Used     Counseling given: No   Past Medical History:  Diagnosis Date  . Basal cell carcinoma of nose 2015   ([name])  . Cancer (HCC)    Hx: of squamous cell left calf  . Diverticulosis 2005   by colonoscopy  . History of chicken pox   . 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 History of pneumothorax 1982   rib fracture, from a fall  . 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 Lower back pain 2012   thought HNP, improved with PT  . Osteoarthritis of left hip   . Pneumothorax, acute    Hx: of   Past Surgical History:  Procedure Laterality Date  . CATARACT EXTRACTION Bilateral 10/2014  . CHEST TUBE INSERTION  1982  . COLONOSCOPY  07/2003   diverticulosis, int hem ([facility])  . [place]  . SKIN CANCER EXCISION  1993, 2014   Left calf  . TOTAL HIP ARTHROPLASTY Left 02/03/2013   [name], MD  . TRIGGER FINGER RELEASE  2001   Family History  Problem Relation Age of Onset  . Coronary artery disease Mother 61  . Hypertension Father   . Diabetes Neg Hx   . Cancer Neg Hx    History  Sexual Activity  . Sexual activity: No    Outpatient Encounter Prescriptions as of 12/10/2016  Medication Sig  . Multiple Vitamin (MULTIVITAMIN) tablet Take 1 tablet by mouth daily.  . [DISCONTINUED] aspirin (ASPIRIN  EC) 81 MG EC tablet Take 81 mg by mouth 2 (two) times a week. Swallow whole.   No facility-administered encounter medications on file as of 12/10/2016.     Activities of Daily Living In your present state of health, do you have any difficulty performing the following activities: 12/10/2016  Hearing? N  Vision? N  Difficulty concentrating or making decisions? N  Walking or climbing stairs? N  Dressing or bathing? N  Doing errands, shopping? N  Preparing Food and eating ? N  Using the Toilet? N  In the past six months, have you accidently leaked urine? N  Do you have problems with loss of bowel control? N  Managing your Medications? N  Managing your Finances? N  Housekeeping or managing your Housekeeping? N  Some recent data might be hidden    Patient Care Team: Ria Bush, MD as PCP - General (Family Medicine)   Assessment:     Hearing Screening   125Hz  250Hz  500Hz  1000Hz  2000Hz  3000Hz  4000Hz  6000Hz  8000Hz   Right ear:   40 40 40  0    Left ear:   40 40 40  0    Vision Screening Comments: Last vision exam  approx. 07/2016 with Dr. Bridgett Larsson   Exercise Activities and Dietary recommendations Current Exercise Habits: Home exercise routine, Type of exercise: Other - see comments (stationary bike, golfing 2x/wk), Time (Minutes): > 60, Frequency (Times/Week): 4, Weekly Exercise (Minutes/Week): 0, Intensity: Moderate, Exercise limited by: None identified  Goals    . Increase physical activity          Starting 12/10/2016, I will continue to play golf at least twice weekly and to exercise for 30-45 min at gym twice weekly.       Fall Risk Fall Risk  12/10/2016 07/04/2015  Falls in the past year? No No   Depression Screen PHQ 2/9 Scores 12/10/2016 07/04/2015 04/15/2011  PHQ - 2 Score 0 0 0  PHQ- 9 Score 0 - -    Cognitive Function MMSE - Mini Mental State Exam 12/10/2016 07/04/2015  Orientation to time 5 5  Orientation to Place 5 5  Registration 3 3  Attention/ Calculation 0 0    Recall 3 3  Language- name 2 objects 0 0  Language- repeat 1 1  Language- follow 3 step command 3 3  Language- read & follow direction 0 0  Write a sentence 0 0  Copy design 0 0  Total score 20 20     PLEASE NOTE: A Mini-Cog screen was completed. Maximum score is 20. A value of 0 denotes this part of Folstein MMSE was not completed or the patient failed this part of the Mini-Cog screening.   Mini-Cog Screening Orientation to Time - Max 5 pts Orientation to Place - Max 5 pts Registration - Max 3 pts Recall - Max 3 pts Language Repeat - Max 1 pts Language Follow 3 Step Command - Max 3 pts     Immunization History  Administered Date(s) Administered  . Influenza,inj,Quad PF,6+ Mos 12/13/2012  . Pneumococcal Conjugate-13 07/04/2015  . Pneumococcal Polysaccharide-23 10/04/2007, 04/28/2011  . Td 03/16/2008  . Zoster 09/08/2013   Screening Tests Health Maintenance  Topic Date Due  . INFLUENZA VACCINE  06/13/2017 (Originally 10/14/2016)  . DTaP/Tdap/Td (1 - Tdap) 03/16/2018 (Originally 03/17/2008)  . TETANUS/TDAP  03/16/2018  . PNA vac Low Risk Adult  Completed      Plan:     I have personally reviewed and addressed the Medicare Annual Wellness questionnaire and have noted the following in the patient's chart:  A. Medical and social history B. Use of alcohol, tobacco or illicit drugs  C. Current medications and supplements D. Functional ability and status E.  Nutritional status F.  Physical activity G. Advance directives H. List of other physicians I.  Hospitalizations, surgeries, and ER visits in previous 12 months J.  [place] to include hearing, vision, cognitive, depression L. Referrals and appointments - none  In addition, I have reviewed and discussed with patient certain preventive protocols, quality metrics, and best practice recommendations. A written personalized care plan for preventive services as well as general preventive health recommendations  were provided to patient.  See attached scanned questionnaire for additional information.   Signed,   Lindell Noe, MHA, BS, LPN Health Coach

## 2016-12-11 ENCOUNTER — Encounter: Payer: Self-pay | Admitting: Family Medicine

## 2016-12-11 ENCOUNTER — Ambulatory Visit: Payer: Medicare Other

## 2016-12-11 ENCOUNTER — Ambulatory Visit (INDEPENDENT_AMBULATORY_CARE_PROVIDER_SITE_OTHER): Payer: Medicare Other | Admitting: Family Medicine

## 2016-12-11 VITALS — BP 134/60 | HR 88 | Temp 97.6°F | Ht 71.0 in | Wt 208.0 lb

## 2016-12-11 DIAGNOSIS — M1612 Unilateral primary osteoarthritis, left hip: Secondary | ICD-10-CM | POA: Diagnosis not present

## 2016-12-11 DIAGNOSIS — Z7189 Other specified counseling: Secondary | ICD-10-CM | POA: Diagnosis not present

## 2016-12-11 DIAGNOSIS — Z86006 Personal history of melanoma in-situ: Secondary | ICD-10-CM

## 2016-12-11 DIAGNOSIS — Z86008 Personal history of in-situ neoplasm of other site: Secondary | ICD-10-CM | POA: Diagnosis not present

## 2016-12-11 NOTE — Assessment & Plan Note (Addendum)
Advanced directive - received and scanned 07/2014. Peter Congo wife is HCPOA, does not want prolonged life support (07/2014). Ok for reversible conditions.

## 2016-12-11 NOTE — Assessment & Plan Note (Signed)
Doing wonderfully after hip replacement.

## 2016-12-11 NOTE — Progress Notes (Signed)
BP 134/60 (BP Location: Left Arm, Patient Position: Sitting, Cuff Size: Normal)   Pulse 88   Temp 97.6 F (36.4 C) (Oral)   Ht 5\' 11"  (1.803 m)   Wt 208 lb (94.3 kg)   SpO2 97%   BMI 29.01 kg/m    CC: AMW f/u visit Subjective:    Patient ID: Samuel Matthews, male    DOB: [birth year], 76 y.o.   MRN: [id number]  HPI: Samuel Matthews is a 76 y.o. male presenting on 12/11/2016 for Annual Exam (Pt 2)   Saw Samuel Matthews yesterday for medicare wellness visit. Note reviewed.   Upcoming trip to [place], [place] for a month.  Regularly sees derm for h/o melanoma.  H/o L hip osteoarthritis s/p replacement  Preventative: COLONOSCOPY Date: 07/2003 diverticulosis, int hem ([facility]). cologuard normal 2017. Prostate- never had screening. Declines. Lung cancer screening - never smoker Flu - declines flu shot prevnar 2017, pneumovax 2013 Td 2010  zostavax 2015 shingrix - declines Advanced directive - received and scanned 07/2014. Samuel Matthews wife is HCPOA, does not want prolonged life support (07/2014). Ok for reversible conditions. Seat belt use discussed.  Sunscreen use discussed. No changing moles on skin. Sees derm regularly. 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, gym 3x weekly (stationary bicycle)  Diet: fruits/vegetables daily, 2x/wk red meat, fish 1x/wk   Relevant past medical, surgical, family and social history reviewed and updated as indicated. Interim medical history since our last visit reviewed. Allergies and medications reviewed and updated. Outpatient Medications Prior to Visit  Medication Sig Dispense Refill  . Multiple Vitamin (MULTIVITAMIN) tablet Take 1 tablet by mouth daily.     No facility-administered medications prior to visit.      Per HPI  unless specifically indicated in ROS section below Review of Systems     Objective:    BP 134/60 (BP Location: Left Arm, Patient Position: Sitting, Cuff Size: Normal)   Pulse 88   Temp 97.6 F (36.4 C) (Oral)   Ht 5\' 11"  (1.803 m)   Wt 208 lb (94.3 kg)   SpO2 97%   BMI 29.01 kg/m   Wt Readings from Last 3 Encounters:  12/11/16 208 lb (94.3 kg)  12/10/16 210 lb (95.3 kg)  07/17/15 202 lb (91.6 kg)    Physical Exam  Constitutional: He is oriented to person, place, and time. He appears well-developed and well-nourished. No distress.  HENT:  Head: Normocephalic and atraumatic.  Right Ear: Hearing, tympanic membrane, external ear and ear canal normal.  Left Ear: Hearing, tympanic membrane, external ear and ear canal normal.  Nose: Nose normal.  Mouth/Throat: Uvula is midline, oropharynx is clear and moist and mucous membranes are normal. No oropharyngeal exudate, posterior oropharyngeal edema or posterior oropharyngeal erythema.  Eyes: Pupils are equal, round, and reactive to light. Conjunctivae and EOM are normal. No scleral icterus.  Neck: Normal range of motion. Neck supple. Carotid bruit is not present. No thyromegaly present.  Cardiovascular: Normal rate, regular rhythm, normal heart sounds and intact distal pulses.   No murmur heard. Pulses:      Radial pulses are 2+ on the right side, and 2+ on the left side.  Pulmonary/Chest: Effort normal and breath sounds normal. No respiratory distress. He has no wheezes. He has  no rales.  Abdominal: Soft. Bowel sounds are normal. He exhibits no distension and no mass. There is no tenderness. There is no rebound and no guarding.  Musculoskeletal: Normal range of motion. He exhibits no edema.  Lymphadenopathy:    He has no cervical adenopathy.  Neurological: He is alert and oriented to person, place, and time.  CN grossly intact, station and gait intact  Skin: Skin is warm and dry. No rash noted.  Psychiatric: He has a normal mood and  affect. His behavior is normal. Judgment and thought content normal.  Nursing note and vitals reviewed.  Results for orders placed or performed in visit on 12/10/16  Lipid panel  Result Value Ref Range   Cholesterol 151 0 - 200 mg/dL   Triglycerides 38.0 0.0 - 149.0 mg/dL   HDL 47.70 >39.00 mg/dL   VLDL 7.6 0.0 - 40.0 mg/dL   LDL Cholesterol 96 0 - 99 mg/dL   Total CHOL/HDL Ratio 3    NonHDL 814.48   Basic metabolic panel  Result Value Ref Range   Sodium 139 135 - 145 mEq/L   Potassium 4.1 3.5 - 5.1 mEq/L   Chloride 107 96 - 112 mEq/L   CO2 27 19 - 32 mEq/L   Glucose, Bld 96 70 - 99 mg/dL   BUN 18 6 - 23 mg/dL   Creatinine, Ser 1.04 0.40 - 1.50 mg/dL   Calcium 9.4 8.4 - 10.5 mg/dL   GFR 73.27 >60.00 mL/min      Assessment & Plan:   Problem List Items Addressed This Visit    Advanced care planning/counseling discussion    Advanced directive - received and scanned 07/2014. Samuel Matthews wife is HCPOA, does not want prolonged life support (07/2014). Ok for reversible conditions.      History of melanoma in situ    Regularly sees derm.       OA (osteoarthritis) of hip - Primary    Doing wonderfully after hip replacement.           Follow up plan: Return in about 1 year (around 12/11/2017) for medicare wellness visit.  Samuel Bush, MD

## 2016-12-11 NOTE — Progress Notes (Signed)
I reviewed health advisor's note, was available for consultation, and agree with documentation and plan.  

## 2016-12-11 NOTE — Patient Instructions (Addendum)
You are doing well today. Continue healthy diet and lifestyle. Good to see you today, call us with questions.   Health Maintenance, Male A healthy lifestyle and preventive care is important for your health and wellness. Ask your health care provider about what schedule of regular examinations is right for you. What should I know about weight and diet? Eat a Healthy Diet  Eat plenty of vegetables, fruits, whole grains, low-fat dairy products, and lean protein.  Do not eat a lot of foods high in solid fats, added sugars, or salt.  Maintain a Healthy Weight Regular exercise can help you achieve or maintain a healthy weight. You should:  Do at least 150 minutes of exercise each week. The exercise should increase your heart rate and make you sweat (moderate-intensity exercise).  Do strength-training exercises at least twice a week.  Watch Your Levels of Cholesterol and Blood Lipids  Have your blood tested for lipids and cholesterol every 5 years starting at 78 years of age. If you are at high risk for heart disease, you should start having your blood tested when you are 78 years old. You may need to have your cholesterol levels checked more often if: ? Your lipid or cholesterol levels are high. ? You are older than 78 years of age. ? You are at high risk for heart disease.  What should I know about cancer screening? Many types of cancers can be detected early and may often be prevented. Lung Cancer  You should be screened every year for lung cancer if: ? You are a current smoker who has smoked for at least 30 years. ? You are a former smoker who has quit within the past 15 years.  Talk to your health care provider about your screening options, when you should start screening, and how often you should be screened.  Colorectal Cancer  Routine colorectal cancer screening usually begins at 78 years of age and should be repeated every 5-10 years until you are 78 years old. You may need  to be screened more often if early forms of precancerous polyps or small growths are found. Your health care provider may recommend screening at an earlier age if you have risk factors for colon cancer.  Your health care provider may recommend using home test kits to check for hidden blood in the stool.  A small camera at the end of a tube can be used to examine your colon (sigmoidoscopy or colonoscopy). This checks for the earliest forms of colorectal cancer.  Prostate and Testicular Cancer  Depending on your age and overall health, your health care provider may do certain tests to screen for prostate and testicular cancer.  Talk to your health care provider about any symptoms or concerns you have about testicular or prostate cancer.  Skin Cancer  Check your skin from head to toe regularly.  Tell your health care provider about any new moles or changes in moles, especially if: ? There is a change in a mole's size, shape, or color. ? You have a mole that is larger than a pencil eraser.  Always use sunscreen. Apply sunscreen liberally and repeat throughout the day.  Protect yourself by wearing long sleeves, pants, a wide-brimmed hat, and sunglasses when outside.  What should I know about heart disease, diabetes, and high blood pressure?  If you are 40-75 years of age, have your blood pressure checked every 3-5 years. If you are 37 years of age or older, have your blood pressure checked  every year. You should have your blood pressure measured twice-once when you are at a hospital or clinic, and once when you are not at a hospital or clinic. Record the average of the two measurements. To check your blood pressure when you are not at a hospital or clinic, you can use: ? An automated blood pressure machine at a pharmacy. ? A home blood pressure monitor.  Talk to your health care provider about your target blood pressure.  If you are between 71-27 years old, ask your health care provider if  you should take aspirin to prevent heart disease.  Have regular diabetes screenings by checking your fasting blood sugar level. ? If you are at a normal weight and have a low risk for diabetes, have this test once every three years after the age of 29. ? If you are overweight and have a high risk for diabetes, consider being tested at a younger age or more often.  A one-time screening for abdominal aortic aneurysm (AAA) by ultrasound is recommended for men aged 32-75 years who are current or former smokers. What should I know about preventing infection? Hepatitis B If you have a higher risk for hepatitis B, you should be screened for this virus. Talk with your health care provider to find out if you are at risk for hepatitis B infection. Hepatitis C Blood testing is recommended for:  Everyone born from [birth year] through 1965.  Anyone with known risk factors for hepatitis C.  Sexually Transmitted Diseases (STDs)  You should be screened each year for STDs including gonorrhea and chlamydia if: ? You are sexually active and are younger than 78 years of age. ? You are older than 78 years of age and your health care provider tells you that you are at risk for this type of infection. ? Your sexual activity has changed since you were last screened and you are at an increased risk for chlamydia or gonorrhea. Ask your health care provider if you are at risk.  Talk with your health care provider about whether you are at high risk of being infected with HIV. Your health care provider may recommend a prescription medicine to help prevent HIV infection.  What else can I do?  Schedule regular health, dental, and eye exams.  Stay current with your vaccines (immunizations).  Do not use any tobacco products, such as cigarettes, chewing tobacco, and e-cigarettes. If you need help quitting, ask your health care provider.  Limit alcohol intake to no more than 2 drinks per day. One drink equals 12 ounces of  beer, 5 ounces of wine, or 1 ounces of hard liquor.  Do not use street drugs.  Do not share needles.  Ask your health care provider for help if you need support or information about quitting drugs.  Tell your health care provider if you often feel depressed.  Tell your health care provider if you have ever been abused or do not feel safe at home. This information is not intended to replace advice given to you by your health care provider. Make sure you discuss any questions you have with your health care provider. Document Released: 08/29/2007 Document Revised: 10/30/2015 Document Reviewed: 12/04/2014 Elsevier Interactive Patient Education  [employer].

## 2016-12-11 NOTE — Assessment & Plan Note (Signed)
Regularly sees derm.  

## 2017-04-01 DIAGNOSIS — C44311 Basal cell carcinoma of skin of nose: Secondary | ICD-10-CM | POA: Diagnosis not present

## 2017-06-08 DIAGNOSIS — Z85820 Personal history of malignant melanoma of skin: Secondary | ICD-10-CM | POA: Diagnosis not present

## 2017-06-08 DIAGNOSIS — L570 Actinic keratosis: Secondary | ICD-10-CM | POA: Diagnosis not present

## 2017-06-08 DIAGNOSIS — L814 Other melanin hyperpigmentation: Secondary | ICD-10-CM | POA: Diagnosis not present

## 2017-06-08 DIAGNOSIS — Z85828 Personal history of other malignant neoplasm of skin: Secondary | ICD-10-CM | POA: Diagnosis not present

## 2017-06-08 DIAGNOSIS — Z1283 Encounter for screening for malignant neoplasm of skin: Secondary | ICD-10-CM | POA: Diagnosis not present

## 2017-06-08 DIAGNOSIS — D1800 Hemangioma unspecified site: Secondary | ICD-10-CM | POA: Diagnosis not present

## 2017-06-08 DIAGNOSIS — D229 Melanocytic nevi, unspecified: Secondary | ICD-10-CM | POA: Diagnosis not present

## 2017-06-08 DIAGNOSIS — L578 Other skin changes due to chronic exposure to nonionizing radiation: Secondary | ICD-10-CM | POA: Diagnosis not present

## 2017-06-08 DIAGNOSIS — L821 Other seborrheic keratosis: Secondary | ICD-10-CM | POA: Diagnosis not present

## 2017-07-07 DIAGNOSIS — Z961 Presence of intraocular lens: Secondary | ICD-10-CM | POA: Diagnosis not present

## 2017-08-25 DIAGNOSIS — C44311 Basal cell carcinoma of skin of nose: Secondary | ICD-10-CM | POA: Diagnosis not present

## 2017-12-14 ENCOUNTER — Other Ambulatory Visit: Payer: Self-pay | Admitting: Family Medicine

## 2017-12-14 ENCOUNTER — Ambulatory Visit: Payer: Medicare Other

## 2017-12-14 DIAGNOSIS — E785 Hyperlipidemia, unspecified: Secondary | ICD-10-CM

## 2017-12-14 DIAGNOSIS — Z86006 Personal history of melanoma in-situ: Secondary | ICD-10-CM

## 2017-12-16 ENCOUNTER — Encounter: Payer: Self-pay | Admitting: Family Medicine

## 2017-12-16 ENCOUNTER — Ambulatory Visit (INDEPENDENT_AMBULATORY_CARE_PROVIDER_SITE_OTHER): Payer: Medicare Other

## 2017-12-16 ENCOUNTER — Ambulatory Visit (INDEPENDENT_AMBULATORY_CARE_PROVIDER_SITE_OTHER): Payer: Medicare Other | Admitting: Family Medicine

## 2017-12-16 VITALS — BP 124/70 | HR 68 | Temp 97.8°F | Ht 70.0 in | Wt 189.5 lb

## 2017-12-16 DIAGNOSIS — Z0000 Encounter for general adult medical examination without abnormal findings: Secondary | ICD-10-CM | POA: Diagnosis not present

## 2017-12-16 DIAGNOSIS — Z86006 Personal history of melanoma in-situ: Secondary | ICD-10-CM | POA: Diagnosis not present

## 2017-12-16 DIAGNOSIS — E785 Hyperlipidemia, unspecified: Secondary | ICD-10-CM

## 2017-12-16 DIAGNOSIS — Z7189 Other specified counseling: Secondary | ICD-10-CM | POA: Diagnosis not present

## 2017-12-16 DIAGNOSIS — E663 Overweight: Secondary | ICD-10-CM | POA: Diagnosis not present

## 2017-12-16 LAB — BASIC METABOLIC PANEL
BUN: 15 mg/dL (ref 6–23)
CO2: 30 mEq/L (ref 19–32)
Calcium: 9.1 mg/dL (ref 8.4–10.5)
Chloride: 107 mEq/L (ref 96–112)
Creatinine, Ser: 1.06 mg/dL (ref 0.40–1.50)
GFR: 71.49 mL/min (ref >60.00)
Glucose, Bld: 100 mg/dL — ABNORMAL HIGH (ref 70–99)
Potassium: 4.4 mEq/L (ref 3.5–5.1)
Sodium: 140 mEq/L (ref 135–145)

## 2017-12-16 LAB — CBC WITH DIFFERENTIAL/PLATELET
Basophils Absolute: 0 10*3/uL (ref 0.0–0.1)
Basophils Relative: 0.6 % (ref 0.0–3.0)
Eosinophils Absolute: 0.2 10*3/uL (ref 0.0–0.7)
Eosinophils Relative: 3 % (ref 0.0–5.0)
HCT: 37.5 % — ABNORMAL LOW (ref 39.0–52.0)
Hemoglobin: 13 g/dL (ref 13.0–17.0)
Lymphocytes Relative: 24.7 % (ref 12.0–46.0)
Lymphs Abs: 1.5 10*3/uL (ref 0.7–4.0)
MCHC: 34.6 g/dL (ref 30.0–36.0)
MCV: 93 fl (ref 78.0–100.0)
Monocytes Absolute: 0.5 10*3/uL (ref 0.1–1.0)
Monocytes Relative: 8.8 % (ref 3.0–12.0)
Neutro Abs: 3.8 10*3/uL (ref 1.4–7.7)
Neutrophils Relative %: 62.9 % (ref 43.0–77.0)
Platelets: 202 10*3/uL (ref 150.0–400.0)
RBC: 4.04 Mil/uL — ABNORMAL LOW (ref 4.22–5.81)
RDW: 12.6 % (ref 11.5–15.5)
WBC: 6.1 10*3/uL (ref 4.0–10.5)

## 2017-12-16 LAB — LIPID PANEL
Cholesterol: 146 mg/dL (ref 0–200)
HDL: 47.8 mg/dL (ref >39.00)
LDL Cholesterol: 89 mg/dL (ref 0–99)
NonHDL: 98.02
Total CHOL/HDL Ratio: 3
Triglycerides: 43 mg/dL (ref 0.0–149.0)
VLDL: 8.6 mg/dL (ref 0.0–40.0)

## 2017-12-16 NOTE — Patient Instructions (Signed)
Mr. Kye , Thank you for taking time to come for your Medicare Wellness Visit. I appreciate your ongoing commitment to your health goals. Please review the following plan we discussed and let me know if I can assist you in the future.   These are the goals we discussed: Goals    . Increase physical activity     Starting 12/16/2017, I will continue to play golf 4-5 hours 3 days per week.        This is a list of the screening recommended for you and due dates:  Health Maintenance  Topic Date Due  . DTaP/Tdap/Td vaccine (1 - Tdap) 03/16/2018*  . Flu Shot  06/15/2018*  . Tetanus Vaccine  03/16/2018  . Pneumonia vaccines  Completed  *Topic was postponed. The date shown is not the original due date.   Preventive Care for Adults  A healthy lifestyle and preventive care can promote health and wellness. Preventive health guidelines for adults include the following key practices.  . A routine yearly physical is a good way to check with your health care provider about your health and preventive screening. It is a chance to share any concerns and updates on your health and to receive a thorough exam.  . Visit your dentist for a routine exam and preventive care every 6 months. Brush your teeth twice a day and floss once a day. Good oral hygiene prevents tooth decay and gum disease.  . The frequency of eye exams is based on your age, health, family medical history, use  of contact lenses, and other factors. Follow your health care provider's recommendations for frequency of eye exams.  . Eat a healthy diet. Foods like vegetables, fruits, whole grains, low-fat dairy products, and lean protein foods contain the nutrients you need without too many calories. Decrease your intake of foods high in solid fats, added sugars, and salt. Eat the right amount of calories for you. Get information about a proper diet from your health care provider, if necessary.  . Regular physical exercise is one of the most  important things you can do for your health. Most adults should get at least 150 minutes of moderate-intensity exercise (any activity that increases your heart rate and causes you to sweat) each week. In addition, most adults need muscle-strengthening exercises on 2 or more days a week.  Silver Sneakers may be a benefit available to you. To determine eligibility, you may visit the website: www.silversneakers.com or contact program at [phone] Mon-Fri between 8AM-8PM.   . Maintain a healthy weight. The body mass index (BMI) is a screening tool to identify possible weight problems. It provides an estimate of body fat based on height and weight. Your health care provider can find your BMI and can help you achieve or maintain a healthy weight.   For adults 20 years and older: ? A BMI below 18.5 is considered underweight. ? A BMI of 18.5 to 24.9 is normal. ? A BMI of 25 to 29.9 is considered overweight. ? A BMI of 30 and above is considered obese.   . Maintain normal blood lipids and cholesterol levels by exercising and minimizing your intake of saturated fat. Eat a balanced diet with plenty of fruit and vegetables. Blood tests for lipids and cholesterol should begin at age 49 and be repeated every 5 years. If your lipid or cholesterol levels are high, you are over 50, or you are at high risk for heart disease, you may need your cholesterol levels checked  more frequently. Ongoing high lipid and cholesterol levels should be treated with medicines if diet and exercise are not working.  . If you smoke, find out from your health care provider how to quit. If you do not use tobacco, please do not start.  . If you choose to drink alcohol, please do not consume more than 2 drinks per day. One drink is considered to be 12 ounces (355 mL) of beer, 5 ounces (148 mL) of wine, or 1.5 ounces (44 mL) of liquor.  . If you are 47-57 years old, ask your health care provider if you should take aspirin to prevent  strokes.  . Use sunscreen. Apply sunscreen liberally and repeatedly throughout the day. You should seek shade when your shadow is shorter than you. Protect yourself by wearing long sleeves, pants, a wide-brimmed hat, and sunglasses year round, whenever you are outdoors.  . Once a month, do a whole body skin exam, using a mirror to look at the skin on your back. Tell your health care provider of new moles, moles that have irregular borders, moles that are larger than a pencil eraser, or moles that have changed in shape or color.

## 2017-12-16 NOTE — Assessment & Plan Note (Signed)
Encouraged regular sunscreen use. Sees Dr Brendolyn Patty Q6 mo

## 2017-12-16 NOTE — Patient Instructions (Addendum)
Use sunscreen regularly.  You are doing well today.  Return as needed or in 1 year for next physical.  Health Maintenance, Male A healthy lifestyle and preventive care is important for your health and wellness. Ask your health care provider about what schedule of regular examinations is right for you. What should I know about weight and diet? Eat a Healthy Diet  Eat plenty of vegetables, fruits, whole grains, low-fat dairy products, and lean protein.  Do not eat a lot of foods high in solid fats, added sugars, or salt.  Maintain a Healthy Weight Regular exercise can help you achieve or maintain a healthy weight. You should:  Do at least 150 minutes of exercise each week. The exercise should increase your heart rate and make you sweat (moderate-intensity exercise).  Do strength-training exercises at least twice a week.  Watch Your Levels of Cholesterol and Blood Lipids  Have your blood tested for lipids and cholesterol every 5 years starting at 79 years of age. If you are at high risk for heart disease, you should start having your blood tested when you are 79 years old. You may need to have your cholesterol levels checked more often if: ? Your lipid or cholesterol levels are high. ? You are older than 79 years of age. ? You are at high risk for heart disease.  What should I know about cancer screening? Many types of cancers can be detected early and may often be prevented. Lung Cancer  You should be screened every year for lung cancer if: ? You are a current smoker who has smoked for at least 30 years. ? You are a former smoker who has quit within the past 15 years.  Talk to your health care provider about your screening options, when you should start screening, and how often you should be screened.  Colorectal Cancer  Routine colorectal cancer screening usually begins at 79 years of age and should be repeated every 5-10 years until you are 79 years old. You may need to be  screened more often if early forms of precancerous polyps or small growths are found. Your health care provider may recommend screening at an earlier age if you have risk factors for colon cancer.  Your health care provider may recommend using home test kits to check for hidden blood in the stool.  A small camera at the end of a tube can be used to examine your colon (sigmoidoscopy or colonoscopy). This checks for the earliest forms of colorectal cancer.  Prostate and Testicular Cancer  Depending on your age and overall health, your health care provider may do certain tests to screen for prostate and testicular cancer.  Talk to your health care provider about any symptoms or concerns you have about testicular or prostate cancer.  Skin Cancer  Check your skin from head to toe regularly.  Tell your health care provider about any new moles or changes in moles, especially if: ? There is a change in a mole's size, shape, or color. ? You have a mole that is larger than a pencil eraser.  Always use sunscreen. Apply sunscreen liberally and repeat throughout the day.  Protect yourself by wearing long sleeves, pants, a wide-brimmed hat, and sunglasses when outside.  What should I know about heart disease, diabetes, and high blood pressure?  If you are 46-36 years of age, have your blood pressure checked every 3-5 years. If you are 44 years of age or older, have your blood pressure checked  every year. You should have your blood pressure measured twice-once when you are at a hospital or clinic, and once when you are not at a hospital or clinic. Record the average of the two measurements. To check your blood pressure when you are not at a hospital or clinic, you can use: ? An automated blood pressure machine at a pharmacy. ? A home blood pressure monitor.  Talk to your health care provider about your target blood pressure.  If you are between 40-29 years old, ask your health care provider if you  should take aspirin to prevent heart disease.  Have regular diabetes screenings by checking your fasting blood sugar level. ? If you are at a normal weight and have a low risk for diabetes, have this test once every three years after the age of 53. ? If you are overweight and have a high risk for diabetes, consider being tested at a younger age or more often.  A one-time screening for abdominal aortic aneurysm (AAA) by ultrasound is recommended for men aged 11-75 years who are current or former smokers. What should I know about preventing infection? Hepatitis B If you have a higher risk for hepatitis B, you should be screened for this virus. Talk with your health care provider to find out if you are at risk for hepatitis B infection. Hepatitis C Blood testing is recommended for:  Everyone born from [birth year] through 1965.  Anyone with known risk factors for hepatitis C.  Sexually Transmitted Diseases (STDs)  You should be screened each year for STDs including gonorrhea and chlamydia if: ? You are sexually active and are younger than 79 years of age. ? You are older than 79 years of age and your health care provider tells you that you are at risk for this type of infection. ? Your sexual activity has changed since you were last screened and you are at an increased risk for chlamydia or gonorrhea. Ask your health care provider if you are at risk.  Talk with your health care provider about whether you are at high risk of being infected with HIV. Your health care provider may recommend a prescription medicine to help prevent HIV infection.  What else can I do?  Schedule regular health, dental, and eye exams.  Stay current with your vaccines (immunizations).  Do not use any tobacco products, such as cigarettes, chewing tobacco, and e-cigarettes. If you need help quitting, ask your health care provider.  Limit alcohol intake to no more than 2 drinks per day. One drink equals 12 ounces of beer,  5 ounces of wine, or 1 ounces of hard liquor.  Do not use street drugs.  Do not share needles.  Ask your health care provider for help if you need support or information about quitting drugs.  Tell your health care provider if you often feel depressed.  Tell your health care provider if you have ever been abused or do not feel safe at home. This information is not intended to replace advice given to you by your health care provider. Make sure you discuss any questions you have with your health care provider. Document Released: 08/29/2007 Document Revised: 10/30/2015 Document Reviewed: 12/04/2014 Elsevier Interactive Patient Education  [employer].

## 2017-12-16 NOTE — Progress Notes (Signed)
PCP notes:   Health maintenance:  Flu vaccine - pt declined  Abnormal screenings:   Hearing - failed  Hearing Screening   125Hz  250Hz  500Hz  1000Hz  2000Hz  3000Hz  4000Hz  6000Hz  8000Hz   Right ear:   40 40 40  0    Left ear:   40 40 40  0     Patient concerns:   None  Nurse concerns:  None  Next PCP appt:   12/16/2017 @ 1030

## 2017-12-16 NOTE — Assessment & Plan Note (Signed)
Congratulated on weight loss to date. He feels he has implemented sustainable diet changes, has an active lifestyle.

## 2017-12-16 NOTE — Progress Notes (Signed)
BP 124/70 (BP Location: Right Arm, Patient Position: Sitting, Cuff Size: Normal)   Pulse 68   Temp 97.8 F (36.6 C)   Ht 5\' 10"  (1.778 m)   Wt 189 lb 8 oz (86 kg)   SpO2 97%   BMI 27.19 kg/m    CC: AMW f/u Subjective:    Patient ID: Samuel Matthews, male    DOB: 11-04-1940, 77 y.o.   MRN: [id number]  HPI: Samuel Matthews is a 77 y.o. male presenting on 12/16/2017 for Annual Exam (Pt 2.)   Samuel Matthews today for medicare wellness visit. Note reviewed.   20 lb weight loss - due to healthy diet changes.  Just had 15 yr wedding anniversary.  Labs done today.  Preventative: COLONOSCOPY Date: 07/2003 diverticulosis, int hem ([facility]). cologuard normal 2017. Prostate - never had screening. Declines.  Lung cancer screening - never smoker  Flu - declines flu shot prevnar 2017, pneumovax 2013 Td 2010  zostavax 2015 shingrix - declines Advanced directive - received and scanned 07/2014. Samuel Matthews wife is HCPOA, does not want prolonged life support (07/2014). Ok for reversible conditions. Seat Matthews use discussed.  Sunscreen use discussed. No changing moles on skin. Sees derm Q82mo [facility]). 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, gym 3x weekly (stationary bicycle)  Diet: fruits/vegetables daily, 2x/wk red meat, fish 1x/wk   Relevant past medical, surgical, family and social history reviewed and updated as indicated. Interim medical history since our last visit reviewed. Allergies and medications reviewed and updated. Outpatient Medications Prior to Visit  Medication Sig Dispense Refill  . Multiple Vitamin (MULTIVITAMIN) tablet Take 1 tablet by mouth daily.     No facility-administered medications prior to visit.      Per HPI unless specifically indicated in ROS section below Review of Systems     Objective:    BP 124/70 (BP Location: Right Arm, Patient Position: Sitting, Cuff Size: Normal)   Pulse 68   Temp 97.8 F (36.6 C)   Ht 5\' 10"  (1.778 m)   Wt 189 lb 8 oz (86 kg)   SpO2 97%   BMI 27.19 kg/m   Wt Readings from Last 3 Encounters:  12/16/17 189 lb 8 oz (86 kg)  12/16/17 189 lb 8 oz (86 kg)  12/11/16 208 lb (94.3 kg)    Physical Exam  Constitutional: He is oriented to person, place, and time. He appears well-developed and well-nourished. No distress.  HENT:  Head: Normocephalic and atraumatic.  Right Ear: Hearing, tympanic membrane, external ear and ear canal normal.  Left Ear: Hearing, tympanic membrane, external ear and ear canal normal.  Nose: Nose normal.  Mouth/Throat: Uvula is midline, oropharynx is clear and moist and mucous membranes are normal. No oropharyngeal exudate, posterior oropharyngeal edema or posterior oropharyngeal erythema.  Eyes: Pupils are equal, round, and reactive to light. Conjunctivae and EOM are normal. No scleral icterus.  Neck: Normal range of motion. Neck supple.  Cardiovascular: Normal rate, regular rhythm, normal heart sounds and intact distal pulses.  No murmur heard. Pulses:      Radial pulses are 2+ on the right side, and 2+ on the left side.  Pulmonary/Chest: Effort normal and breath sounds normal. No respiratory distress.  He has no wheezes. He has no rales.  Abdominal: Soft. Bowel sounds are normal. He exhibits no distension and no mass. There is no tenderness. There is no rebound and no guarding.  Musculoskeletal: Normal range of motion. He exhibits no edema.  Lymphadenopathy:    He has no cervical adenopathy.  Neurological: He is alert and oriented to person, place, and time.  CN grossly intact, station and gait intact  Skin: Skin is warm and dry. No rash noted.  Psychiatric: He has a normal mood and affect. His behavior is normal. Judgment  and thought content normal.  Nursing note and vitals reviewed.  Results for orders placed or performed in visit on 12/10/16  Lipid panel  Result Value Ref Range   Cholesterol 151 0 - 200 mg/dL   Triglycerides 38.0 0.0 - 149.0 mg/dL   HDL 47.70 >39.00 mg/dL   VLDL 7.6 0.0 - 40.0 mg/dL   LDL Cholesterol 96 0 - 99 mg/dL   Total CHOL/HDL Ratio 3    NonHDL 155.20   Basic metabolic panel  Result Value Ref Range   Sodium 139 135 - 145 mEq/L   Potassium 4.1 3.5 - 5.1 mEq/L   Chloride 107 96 - 112 mEq/L   CO2 27 19 - 32 mEq/L   Glucose, Bld 96 70 - 99 mg/dL   BUN 18 6 - 23 mg/dL   Creatinine, Ser 1.04 0.40 - 1.50 mg/dL   Calcium 9.4 8.4 - 10.5 mg/dL   GFR 73.27 >60.00 mL/min      Assessment & Plan:   Problem List Items Addressed This Visit    Overweight with body mass index (BMI) 25.0-29.9    Congratulated on weight loss to date. He feels he has implemented sustainable diet changes, has an active lifestyle.       History of melanoma in situ    Encouraged regular sunscreen use. Sees Dr Brendolyn Patty Q6 mo      Advanced care planning/counseling discussion - Primary    Advanced directive - received and scanned 07/2014. Samuel Matthews wife is HCPOA, does not want prolonged life support (07/2014). Ok for reversible conditions.          No orders of the defined types were placed in this encounter.  No orders of the defined types were placed in this encounter.   Follow up plan: Return for medicare wellness visit, follow up visit.  Samuel Bush, MD

## 2017-12-16 NOTE — Progress Notes (Signed)
Subjective:   Samuel Matthews is a 77 y.o. male who presents for Medicare Annual/Subsequent preventive examination.  Review of Systems:  N/A Cardiac Risk Factors include: advanced age (>22men, >60 women);male gender;dyslipidemia     Objective:    Vitals: BP 124/70 (BP Location: Right Arm, Patient Position: Sitting, Cuff Size: Normal)   Pulse 68   Temp 97.8 F (36.6 C) (Oral)   Ht 5\' 10"  (1.778 m) Comment: no shoes  Wt 189 lb 8 oz (86 kg)   SpO2 97%   BMI 27.19 kg/m   Body mass index is 27.19 kg/m.  Advanced Directives 12/16/2017 12/10/2016 07/04/2015 01/25/2013  Does Patient Have a Medical Advance Directive? Yes Yes Yes Patient has advance directive, copy not in chart  Type of Advance Directive [place];Living will [place];Living will [place];Living will [place];Living will  Does patient want to make changes to medical advance directive? - - No - Patient declined -  Copy of [place] in Chart? No - copy requested Yes No - copy requested Copy requested from family    Tobacco Social History   Tobacco Use  Smoking Status Never Smoker  Smokeless Tobacco Never Used     Counseling given: No   Clinical Intake:  Pre-visit preparation completed: Yes  Pain : No/denies pain Pain Score: 0-No pain     Nutritional Status: BMI 25 -29 Overweight Nutritional Risks: None Diabetes: No  How often do you need to have someone help you when you read instructions, pamphlets, or other written materials from your doctor or pharmacy?: 1 - Never What is the last grade level you completed in school?: Bachelor  Interpreter Needed?: No  Comments: pt lives with spouse Information entered by :: LPinson, LPN  Past Medical History:  Diagnosis Date  . Basal cell carcinoma of nose 2015   ([name])  . Cancer (HCC)    Hx: of squamous cell left calf  . Diverticulosis 2005   by  colonoscopy  . History of chicken pox   . 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 History of pneumothorax 1982   rib fracture, from a fall  . 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 Lower back pain 2012   thought HNP, improved with PT  . Osteoarthritis of left hip   . Pneumothorax, acute    Hx: of   Past Surgical History:  Procedure Laterality Date  . CATARACT EXTRACTION Bilateral 10/2014  . CHEST TUBE INSERTION  1982  . COLONOSCOPY  07/2003   diverticulosis, int hem ([facility])  . [place]  . SKIN CANCER EXCISION  1993, 2014   Left calf  . TOTAL HIP ARTHROPLASTY Left 02/03/2013   [name], MD  . TRIGGER FINGER RELEASE  2001   Family History  Problem Relation Age of Onset  . Coronary artery disease Mother 44  . Hypertension Father   . Diabetes Neg Hx   . Cancer Neg Hx    Social History   Socioeconomic History  . Marital status: Married    Spouse name: Not on file  . Number of children: Not on file  . Years of education: Not on file  . Highest education level: Not on file  Occupational History  . Occupation: Retired  Scientific laboratory technician  . Financial resource strain: Not on file  .  Food insecurity:    Worry: Not on file    Inability: Not on file  . Transportation needs:    Medical: Not on file    Non-medical: Not on file  Tobacco Use  . Smoking status: Never Smoker  . Smokeless tobacco: Never Used  Substance and Sexual Activity  . Alcohol use: Yes    Comment: Occasional  . Drug use: No  . Sexual activity: Never  Lifestyle  . Physical activity:    Days per week: Not on file    Minutes per session: Not on file  . Stress: Not on file  Relationships  . Social connections:    Talks on phone: Not on file    Gets together: Not on file    Attends religious service: Not on file    Active member of club or organization: Not on file    Attends meetings of clubs or organizations: Not on file     Relationship status: Not on file  Other Topics Concern  . 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, gym twice weekly.    Diet: fruits/vegetables daily, 2x/wk red meat, fish 1x/wk    Outpatient Encounter Medications as of 12/16/2017  Medication Sig  . Multiple Vitamin (MULTIVITAMIN) tablet Take 1 tablet by mouth daily.   No facility-administered encounter medications on file as of 12/16/2017.     Activities of Daily Living In your present state of health, do you have any difficulty performing the following activities: 12/16/2017  Hearing? N  Vision? N  Difficulty concentrating or making decisions? N  Walking or climbing stairs? N  Dressing or bathing? N  Doing errands, shopping? N  Preparing Food and eating ? N  Using the Toilet? N  In the past six months, have you accidently leaked urine? N  Do you have problems with loss of bowel control? N  Managing your Medications? N  Managing your Finances? N  Housekeeping or managing your Housekeeping? N  Some recent data might be hidden    Patient Care Team: Ria Bush, MD as PCP - General (Family Medicine)   Assessment:   This is a routine wellness examination for Samuel Matthews.   Hearing Screening   125Hz  250Hz  500Hz  1000Hz  2000Hz  3000Hz  4000Hz  6000Hz  8000Hz   Right ear:   40 40 40  0    Left ear:   40 40 40  0    Vision Screening Comments: Vision exam in July 2019 with Dr. Bridgett Larsson  Exercise Activities and Dietary recommendations Current Exercise Habits: Home exercise routine, Type of exercise: Other - see comments(golf, yard work), Time (Minutes): > 60(4-5 hours), Frequency (Times/Week): 3, Weekly Exercise (Minutes/Week): 0, Intensity: Moderate, Exercise limited by: None identified  Goals    .  Increase physical activity     Starting 12/16/2017, I will continue to play golf 4-5 hours 3 days per week.        Fall Risk Fall Risk  12/16/2017 12/10/2016 07/04/2015  Falls in the past year? No No No   Depression Screen PHQ 2/9 Scores 12/16/2017 12/10/2016 07/04/2015 04/15/2011  PHQ - 2 Score 0 0 0 0  PHQ- 9 Score 0 0 - -    Cognitive Function MMSE - Mini Mental State Exam 12/16/2017  12/10/2016 07/04/2015  Orientation to time 5 5 5   Orientation to Place 5 5 5   Registration 3 3 3   Attention/ Calculation 0 0 0  Recall 3 3 3   Language- name 2 objects 0 0 0  Language- repeat 1 1 1   Language- follow 3 step command 3 3 3   Language- read & follow direction 0 0 0  Write a sentence 0 0 0  Copy design 0 0 0  Total score 20 20 20      PLEASE NOTE: A Mini-Cog screen was completed. Maximum score is 20. A value of 0 denotes this part of [place] MMSE was not completed or the patient failed this part of the Mini-Cog screening.   Mini-Cog Screening Orientation to Time - Max 5 pts Orientation to Place - Max 5 pts Registration - Max 3 pts Recall - Max 3 pts Language Repeat - Max 1 pts Language Follow 3 Step Command - Max 3 pts     Immunization History  Administered Date(s) Administered  . Influenza,inj,Quad PF,6+ Mos 12/13/2012  . Pneumococcal Conjugate-13 07/04/2015  . Pneumococcal Polysaccharide-23 10/04/2007, 04/28/2011  . Td 03/16/2008  . Zoster 09/08/2013    Screening Tests Health Maintenance  Topic Date Due  . DTaP/Tdap/Td (1 - Tdap) 03/16/2018 (Originally 03/17/2008)  . INFLUENZA VACCINE  06/15/2018 (Originally 10/14/2017)  . TETANUS/TDAP  03/16/2018  . PNA vac Low Risk Adult  Completed       Plan:     I have personally reviewed, addressed, and noted the following in the patient's chart:  A. Medical and social history B. Use of alcohol, tobacco or illicit drugs  C. Current medications and supplements D. Functional ability and status E.  Nutritional status F.  Physical  activity G. Advance directives H. List of other physicians I.  Hospitalizations, surgeries, and ER visits in previous 12 months J.  [place] to include hearing, vision, cognitive, depression L. Referrals and appointments - none  In addition, I have reviewed and discussed with patient certain preventive protocols, quality metrics, and best practice recommendations. A written personalized care plan for preventive services as well as general preventive health recommendations were provided to patient.  See attached scanned questionnaire for additional information.   Signed,   Lindell Noe, MHA, BS, LPN Health Coach

## 2017-12-16 NOTE — Assessment & Plan Note (Signed)
Advanced directive - received and scanned 07/2014. Peter [place] wife is HCPOA, does not want prolonged life support (07/2014). Ok for reversible conditions.

## 2017-12-20 DIAGNOSIS — L812 Freckles: Secondary | ICD-10-CM | POA: Diagnosis not present

## 2017-12-20 DIAGNOSIS — Z85828 Personal history of other malignant neoplasm of skin: Secondary | ICD-10-CM | POA: Diagnosis not present

## 2017-12-20 DIAGNOSIS — L821 Other seborrheic keratosis: Secondary | ICD-10-CM | POA: Diagnosis not present

## 2017-12-20 DIAGNOSIS — L570 Actinic keratosis: Secondary | ICD-10-CM | POA: Diagnosis not present

## 2017-12-20 DIAGNOSIS — L820 Inflamed seborrheic keratosis: Secondary | ICD-10-CM | POA: Diagnosis not present

## 2017-12-20 DIAGNOSIS — L578 Other skin changes due to chronic exposure to nonionizing radiation: Secondary | ICD-10-CM | POA: Diagnosis not present

## 2017-12-20 DIAGNOSIS — L720 Epidermal cyst: Secondary | ICD-10-CM | POA: Diagnosis not present

## 2017-12-20 DIAGNOSIS — Z85820 Personal history of malignant melanoma of skin: Secondary | ICD-10-CM | POA: Diagnosis not present

## 2017-12-20 DIAGNOSIS — D225 Melanocytic nevi of trunk: Secondary | ICD-10-CM | POA: Diagnosis not present

## 2017-12-20 DIAGNOSIS — Z1283 Encounter for screening for malignant neoplasm of skin: Secondary | ICD-10-CM | POA: Diagnosis not present

## 2017-12-20 DIAGNOSIS — D0361 Melanoma in situ of right upper limb, including shoulder: Secondary | ICD-10-CM | POA: Diagnosis not present

## 2017-12-20 DIAGNOSIS — D485 Neoplasm of uncertain behavior of skin: Secondary | ICD-10-CM | POA: Diagnosis not present

## 2017-12-20 DIAGNOSIS — D1800 Hemangioma unspecified site: Secondary | ICD-10-CM | POA: Diagnosis not present

## 2018-01-17 DIAGNOSIS — L988 Other specified disorders of the skin and subcutaneous tissue: Secondary | ICD-10-CM | POA: Diagnosis not present

## 2018-01-17 DIAGNOSIS — D039 Melanoma in situ, unspecified: Secondary | ICD-10-CM

## 2018-01-17 DIAGNOSIS — D0361 Melanoma in situ of right upper limb, including shoulder: Secondary | ICD-10-CM | POA: Diagnosis not present

## 2018-01-17 HISTORY — DX: Melanoma in situ, unspecified: D03.9

## 2018-01-17 NOTE — Progress Notes (Signed)
I reviewed health advisor's note, was available for consultation, and agree with documentation and plan.  

## 2018-01-18 ENCOUNTER — Other Ambulatory Visit: Payer: Self-pay

## 2018-01-18 ENCOUNTER — Emergency Department: Payer: Medicare Other

## 2018-01-18 ENCOUNTER — Encounter: Payer: Self-pay | Admitting: Emergency Medicine

## 2018-01-18 ENCOUNTER — Inpatient Hospital Stay
Admission: EM | Admit: 2018-01-18 | Discharge: 2018-01-20 | DRG: 312 | Disposition: A | Discharge status: Home / Self Care | Payer: Medicare Other | Attending: Internal Medicine | Admitting: Internal Medicine

## 2018-01-18 ENCOUNTER — Observation Stay: Payer: Medicare Other

## 2018-01-18 DIAGNOSIS — H6690 Otitis media, unspecified, unspecified ear: Secondary | ICD-10-CM | POA: Diagnosis present

## 2018-01-18 DIAGNOSIS — I959 Hypotension, unspecified: Secondary | ICD-10-CM | POA: Diagnosis not present

## 2018-01-18 DIAGNOSIS — E860 Dehydration: Secondary | ICD-10-CM

## 2018-01-18 DIAGNOSIS — J019 Acute sinusitis, unspecified: Secondary | ICD-10-CM | POA: Diagnosis not present

## 2018-01-18 DIAGNOSIS — R55 Syncope and collapse: Secondary | ICD-10-CM | POA: Diagnosis not present

## 2018-01-18 DIAGNOSIS — K5730 Diverticulosis of large intestine without perforation or abscess without bleeding: Secondary | ICD-10-CM | POA: Diagnosis not present

## 2018-01-18 DIAGNOSIS — R531 Weakness: Secondary | ICD-10-CM

## 2018-01-18 DIAGNOSIS — Z882 Allergy status to sulfonamides status: Secondary | ICD-10-CM

## 2018-01-18 DIAGNOSIS — R197 Diarrhea, unspecified: Secondary | ICD-10-CM

## 2018-01-18 DIAGNOSIS — I951 Orthostatic hypotension: Secondary | ICD-10-CM | POA: Diagnosis not present

## 2018-01-18 DIAGNOSIS — Z9889 Other specified postprocedural states: Secondary | ICD-10-CM | POA: Diagnosis not present

## 2018-01-18 DIAGNOSIS — R112 Nausea with vomiting, unspecified: Secondary | ICD-10-CM

## 2018-01-18 DIAGNOSIS — Z96642 Presence of left artificial hip joint: Secondary | ICD-10-CM | POA: Diagnosis not present

## 2018-01-18 DIAGNOSIS — I10 Essential (primary) hypertension: Secondary | ICD-10-CM | POA: Diagnosis not present

## 2018-01-18 DIAGNOSIS — Z86006 Personal history of melanoma in-situ: Secondary | ICD-10-CM

## 2018-01-18 DIAGNOSIS — R42 Dizziness and giddiness: Secondary | ICD-10-CM | POA: Diagnosis not present

## 2018-01-18 LAB — CBC
HCT: 36.8 % — ABNORMAL LOW (ref 39.0–52.0)
Hemoglobin: 12.7 g/dL — ABNORMAL LOW (ref 13.0–17.0)
MCH: 32.4 pg (ref 26.0–34.0)
MCHC: 34.5 g/dL (ref 30.0–36.0)
MCV: 93.9 fL (ref 80.0–100.0)
Platelets: 171 10*3/uL (ref 150–400)
RBC: 3.92 MIL/uL — ABNORMAL LOW (ref 4.22–5.81)
RDW: 12.1 % (ref 11.5–15.5)
WBC: 9.4 10*3/uL (ref 4.0–10.5)
nRBC: 0 % (ref 0.0–0.2)

## 2018-01-18 LAB — BASIC METABOLIC PANEL
Anion gap: 8 (ref 5–15)
BUN: 28 mg/dL — ABNORMAL HIGH (ref 8–23)
CO2: 25 mmol/L (ref 22–32)
Calcium: 9 mg/dL (ref 8.9–10.3)
Chloride: 105 mmol/L (ref 98–111)
Creatinine, Ser: 1.32 mg/dL — ABNORMAL HIGH (ref 0.61–1.24)
GFR calc Af Amer: 58 mL/min — ABNORMAL LOW (ref >60)
GFR calc non Af Amer: 50 mL/min — ABNORMAL LOW (ref >60)
Glucose, Bld: 114 mg/dL — ABNORMAL HIGH (ref 70–99)
Potassium: 3.7 mmol/L (ref 3.5–5.1)
Sodium: 138 mmol/L (ref 135–145)

## 2018-01-18 LAB — URINALYSIS, COMPLETE (UACMP) WITH MICROSCOPIC
Bacteria, UA: NONE SEEN
Bilirubin Urine: NEGATIVE
Glucose, UA: NEGATIVE mg/dL
Ketones, ur: NEGATIVE mg/dL
Leukocytes, UA: NEGATIVE
Nitrite: NEGATIVE
Protein, ur: NEGATIVE mg/dL
Specific Gravity, Urine: 1.017 (ref 1.005–1.030)
Squamous Epithelial / LPF: NONE SEEN (ref 0–5)
pH: 6 (ref 5.0–8.0)

## 2018-01-18 LAB — TROPONIN I: Troponin I: <0.03 ng/mL (ref <0.03)

## 2018-01-18 MED ORDER — ENOXAPARIN SODIUM 40 MG/0.4ML ~~LOC~~ SOLN
40.0000 mg | SUBCUTANEOUS | Status: DC
  Administered 2018-01-18 – 2018-01-19 (×2): 40 mg via SUBCUTANEOUS
  Filled 2018-01-18 (×2): qty 0.4

## 2018-01-18 MED ORDER — SODIUM CHLORIDE 0.9 % IV BOLUS
1000.0000 mL | Freq: Once | INTRAVENOUS | Status: AC
  Administered 2018-01-18: 1000 mL via INTRAVENOUS

## 2018-01-18 MED ORDER — ACETAMINOPHEN 325 MG PO TABS
650.0000 mg | ORAL_TABLET | Freq: Four times a day (QID) | ORAL | Status: DC | PRN
  Administered 2018-01-18: 21:00:00 650 mg via ORAL
  Filled 2018-01-18: qty 2

## 2018-01-18 MED ORDER — MECLIZINE HCL 25 MG PO TABS
25.0000 mg | ORAL_TABLET | Freq: Once | ORAL | Status: AC
  Administered 2018-01-18: 25 mg via ORAL
  Filled 2018-01-18: qty 1

## 2018-01-18 MED ORDER — ONDANSETRON HCL 4 MG/2ML IJ SOLN
4.0000 mg | Freq: Once | INTRAMUSCULAR | Status: AC
Start: 2018-01-18 — End: 2018-01-18
  Administered 2018-01-18: 4 mg via INTRAVENOUS
  Filled 2018-01-18: qty 2

## 2018-01-18 MED ORDER — ACETAMINOPHEN 650 MG RE SUPP: 650.0000 mg | Freq: Four times a day (QID) | RECTAL | Status: DC | PRN

## 2018-01-18 MED ORDER — IBUPROFEN 400 MG PO TABS
800.0000 mg | ORAL_TABLET | Freq: Four times a day (QID) | ORAL | Status: DC | PRN
  Administered 2018-01-19: 10:00:00 800 mg via ORAL
  Filled 2018-01-18: qty 2

## 2018-01-18 MED ORDER — SODIUM CHLORIDE 0.9 % IV SOLN
INTRAVENOUS | Status: DC
  Administered 2018-01-18 – 2018-01-20 (×4): via INTRAVENOUS

## 2018-01-18 MED ORDER — AMOXICILLIN-POT CLAVULANATE 875-125 MG PO TABS
1.0000 | ORAL_TABLET | Freq: Two times a day (BID) | ORAL | Status: DC
  Administered 2018-01-18 – 2018-01-20 (×4): 1 via ORAL
  Filled 2018-01-18 (×4): qty 1

## 2018-01-18 NOTE — ED Provider Notes (Signed)
-----------------------------------------   5:49 PM on 01/18/2018 -----------------------------------------  Patient care assumed from Dr. Alfred Levins.  Patient remains hypotensive despite IV fluids, currently 96/55.  Remains lightheaded with any attempted standing.  Reassuringly patient's labs are largely normal besides a mild renal insufficiency.  CT imaging is negative.  I reviewed the patient's records, recent blood pressure at PCP is 100 systolic.  On orthostatic vitals patient's blood pressure drops to 81 systolic upon standing continues to feel lightheaded.  We will admit to the hospitalist service for further treatment.   Harvest Dark, MD 01/18/18 [phone]

## 2018-01-18 NOTE — ED Notes (Signed)
Ashley RN, aware of bed assigned  

## 2018-01-18 NOTE — ED Provider Notes (Addendum)
[facility] Emergency Department Provider Note  ____________________________________________  Time seen: Approximately 2:55 PM  I have reviewed the triage vital signs and the nursing notes.   HISTORY  Chief Complaint Weakness   HPI Samuel Matthews is a 77 y.o. male history of melanoma in situ who presents for evaluation of dizziness.  Patient reports that yesterday he had a procedure at his dermatologist office to remove a melanoma in situ.  No general anesthesia just local lidocaine with epi.  He reports that he went home and did well.  Had dinner last night and went to bed.  Woke up this morning and went to [employer] to get some coffee.  When he came back home he developed sudden onset of dizziness that he describes as feeling lightheaded like he is going to pass out, he developed blurry vision.  He lowered himself to the ground to prevent him from falling and was so weak that he was unable to stand up.  He then had several episodes of nonbloody nonbilious emesis and watery diarrhea.  No melena, hematemesis, coffee-ground emesis.  No abdominal pain, chest pain, shortness of breath, headache.  He called his dermatologist with concerns that the local anesthetic could be the cause of his symptoms but they were told no and to come to the emergency room for evaluation.   Past Medical History:  Diagnosis Date  . Basal cell carcinoma of nose 2015   ([name])  . Cancer (HCC)    Hx: of squamous cell left calf  . Diverticulosis 2005   by colonoscopy  . History of chicken pox   . 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 History of pneumothorax 1982   rib fracture, from a fall  . 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 Lower back pain 2012   thought HNP, improved with PT  . Osteoarthritis of left hip   . Pneumothorax, acute    Hx: of    Patient Active Problem List   Diagnosis Date Noted  .  Overweight with body mass index (BMI) 25.0-29.9 12/16/2017  . Advanced care planning/counseling discussion 07/22/2014  . History of melanoma in situ   . OA (osteoarthritis) of hip 02/03/2013  . Medicare annual wellness visit, initial 04/15/2011    Past Surgical History:  Procedure Laterality Date  . CATARACT EXTRACTION Bilateral 10/2014  . CHEST TUBE INSERTION  1982  . COLONOSCOPY  07/2003   diverticulosis, int hem ([facility])  . [place]  . SKIN CANCER EXCISION  1993, 2014   Left calf  . TOTAL HIP ARTHROPLASTY Left 02/03/2013   [name], MD  . TRIGGER FINGER RELEASE  2001    Prior to Admission medications   Medication Sig Start Date End Date Taking? Authorizing Provider  Multiple Vitamin (MULTIVITAMIN) tablet Take 1 tablet by mouth daily.    [provider]    Allergies Sulfa drugs cross reactors  Family History  Problem Relation Age of Onset  . Coronary artery disease Mother 16  . Hypertension Father   . Diabetes Neg Hx   . Cancer Neg Hx     Social History Social History   Tobacco Use  . Smoking status: Never Smoker  . Smokeless tobacco: Never Used  Substance Use Topics  . Alcohol use: Yes    Comment: Occasional  . Drug use: No  Review of Systems  Constitutional: Negative for fever. + dizziness Eyes: Negative for visual changes. ENT: Negative for sore throat. Neck: No neck pain  Cardiovascular: Negative for chest pain. Respiratory: Negative for shortness of breath. Gastrointestinal: Negative for abdominal pain. + vomiting and diarrhea. Genitourinary: Negative for dysuria. Musculoskeletal: Negative for back pain. Skin: Negative for rash. Neurological: Negative for headaches, weakness or numbness. Psych: No SI or HI  ____________________________________________   PHYSICAL EXAM:  VITAL SIGNS: ED Triage Vitals  Enc Vitals Group     BP 01/18/18 1254 (!) 80/51     Pulse Rate 01/18/18 1254 99     Resp 01/18/18 1254 18      Temp 01/18/18 1254 98.6 F (37 C)     Temp Source 01/18/18 1254 Oral     SpO2 01/18/18 1254 97 %     Weight 01/18/18 1256 184 lb (83.5 kg)     Height 01/18/18 1256 5\' 11"  (1.803 m)     Head Circumference --      Peak Flow --      Pain Score 01/18/18 1256 7     Pain Loc --      Pain Edu? --      Excl. in [place]? --     Constitutional: Alert and oriented. Well appearing and in no apparent distress. HEENT:      Head: Normocephalic and atraumatic.         Eyes: Conjunctivae are normal. Sclera is non-icteric.       Mouth/Throat: Mucous membranes are moist.       Neck: Supple with no signs of meningismus. Cardiovascular: Regular rate and rhythm. No murmurs, gallops, or rubs. 2+ symmetrical distal pulses are present in all extremities. No JVD. Respiratory: Normal respiratory effort. Lungs are clear to auscultation bilaterally. No wheezes, crackles, or rhonchi.  Gastrointestinal: Soft, non tender, and non distended with positive bowel sounds. No rebound or guarding. Musculoskeletal: Nontender with normal range of motion in all extremities. No edema, cyanosis, or erythema of extremities. Surgical site is dry with stitches in place, no evidence of infection Neurologic: Normal speech and language. A & O x3, PERRL, EOMI, no nystagmus, CN II-XII intact, motor testing reveals good tone and bulk throughout. There is no evidence of pronator drift or dysmetria. Muscle strength is 5/5 throughout.  Sensory examination is intact. Gait deferred Skin: Skin is warm, dry and intact. No rash noted. Psychiatric: Mood and affect are normal. Speech and behavior are normal.  ____________________________________________   LABS (all labs ordered are listed, but only abnormal results are displayed)  Labs Reviewed  BASIC METABOLIC PANEL - Abnormal; Notable for the following components:      Result Value   Glucose, Bld 114 (*)    BUN 28 (*)    Creatinine, Ser 1.32 (*)    GFR calc non Af Amer 50 (*)    GFR calc  Af Amer 58 (*)    All other components within normal limits  CBC - Abnormal; Notable for the following components:   RBC 3.92 (*)    Hemoglobin 12.7 (*)    HCT 36.8 (*)    All other components within normal limits  TROPONIN I  URINALYSIS, COMPLETE (UACMP) WITH MICROSCOPIC  CBG MONITORING, ED   ____________________________________________  EKG  ED ECG REPORT I, Rudene Re, the attending physician, personally viewed and interpreted this ECG.   Normal sinus rhythm, rate of 95, normal intervals, normal axis, no ST elevations or depressions.  Normal EKG. ____________________________________________  RADIOLOGY  I have personally reviewed the images performed during this visit and I agree with the Radiologist's read.   Interpretation by Radiologist:  Ct Head Wo Contrast  Result Date: 01/18/2018 CLINICAL DATA:  Nausea, vomiting and diarrhea since 4 a.m. Hypotensive and diaphoretic. EXAM: CT HEAD WITHOUT CONTRAST TECHNIQUE: Contiguous axial images were obtained from the base of the skull through the vertex without intravenous contrast. COMPARISON:  None. FINDINGS: Brain: The ventricles are in the midline without mass effect or shift. They are normal in size and configuration for age. No extra-axial fluid collections are identified. No CT findings for an acute intracranial process such as hemispheric infarction or intracranial hemorrhage. The gray-white differentiation is maintained. No mass lesions. The brainstem and cerebellum appear normal. Vascular: No hyperdense vessel or unexpected calcification. Skull: No skull fracture or bone lesion. Sinuses/Orbits: The paranasal sinuses and mastoid air cells are clear except for scattered ethmoid air cell mucoperiosteal thickening. The globes are intact. In Other: No scalp lesions or hematoma. IMPRESSION: No acute intracranial findings or mass lesions. Electronically Signed   By: Marijo Sanes M.D.   On: 01/18/2018 14:16       ____________________________________________   PROCEDURES  Procedure(s) performed: None Procedures Critical Care performed:  CRITICAL CARE Performed by: Rudene Re  ?  Total critical care time: 30 min  Critical care time was exclusive of separately billable procedures and treating other patients.  Critical care was necessary to treat or prevent imminent or life-threatening deterioration.  Critical care was time spent personally by me on the following activities: development of treatment plan with patient and/or surrogate as well as nursing, discussions with consultants, evaluation of patient's response to treatment, examination of patient, obtaining history from patient or surrogate, ordering and performing treatments and interventions, ordering and review of laboratory studies, ordering and review of radiographic studies, pulse oximetry and re-evaluation of patient's condition.  ____________________________________________   INITIAL IMPRESSION / ASSESSMENT AND PLAN / ED COURSE  77 y.o. male history of melanoma in situ who presents for evaluation of dizziness and several episodes of vomiting and diarrhea.  Patient arrives hypotensive to the emergency room with blood pressure of 80/51, borderline tachycardic, afebrile and neurologically intact.  Abdomen is soft with no tenderness throughout.  EKG showing no ischemic changes.  Differential diagnoses including dehydration versus AKI versus cardiac dysrhythmias versus central/peripheral vertigo.  Will monitor patient on telemetry, check labs including CBC, BMP and troponin.  Will check urinalysis.  Will give IV fluids for dehydration and meclizine.  Due to history of melanoma we will send patient for head CT to rule out intracranial melanoma/mass/bleed.     _________________________ 3:15 PM on 01/18/2018 -----------------------------------------  Labs showing mild AKI.  Blood pressure is improving with IV fluids. Head CT  negative.  Patient's symptoms are most likely due to dehydration in the setting of diarrhea and vomiting.  Plan to repeat vitals and ambulate after IVF. UA pending. Care transferred to Dr. Kerman Passey.   As part of my medical decision making, I reviewed the following data within the [place] notes reviewed and incorporated, Labs reviewed , EKG interpreted , Old EKG reviewed, Old chart reviewed, Radiograph reviewed , Notes from prior ED visits and [place] Controlled Substance Database    Pertinent labs & imaging results that were available during my care of the patient were reviewed by me and considered in my medical decision making (see chart for details).    ____________________________________________   FINAL CLINICAL IMPRESSION(S) / ED  DIAGNOSES  Final diagnoses:  Dehydration  Nausea vomiting and diarrhea  Near syncope      NEW MEDICATIONS STARTED DURING THIS VISIT:  ED Discharge Orders    None       Note:  This document was prepared using Dragon voice recognition software and may include unintentional dictation errors.    Rudene Re, MD 01/18/18 [place], Todd [place], MD 03/03/18 [phone]

## 2018-01-18 NOTE — ED Notes (Signed)
Pt given meal tray and ginger ale to drink 

## 2018-01-18 NOTE — ED Notes (Signed)
ED Provider at bedside. 

## 2018-01-18 NOTE — ED Notes (Signed)
Admitting provider at bedside.

## 2018-01-18 NOTE — ED Notes (Signed)
Hospitalist to bedside at this time 

## 2018-01-18 NOTE — H&P (Signed)
[place] [facility] [place] NAME: Samuel Matthews    MR#:  [id number]  DATE OF BIRTH:  05-Jan-1941  DATE OF ADMISSION:  01/18/2018  PRIMARY CARE PHYSICIAN: Ria Bush, MD   REQUESTING/REFERRING PHYSICIAN: Dr Harvest Dark  CHIEF COMPLAINT:   Chief Complaint  Patient presents with  . Weakness    HISTORY OF PRESENT ILLNESS:  Samuel Matthews  is a 77 y.o. 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  He was able to eat before and after the procedure.  He did have some pain when anesthesia wore off.  He normally gets up very early around 3:30 AM and went to get some coffee and then took the trash out.  He initially went into work but they told me they look good and sent him home.  At home he then developed some dizziness and cold clammy feeling and did not feel well.  He had to go down on his hands and knees.  He had 3 episodes of nausea vomiting and 3 episodes of diarrhea and then some shaking chill.  He does not feel like he has to have any further diarrhea or vomiting.  He ate just now.  In the ER he was found to have a blood pressure that dropped when he stood.  He is getting his third liter of IV fluids.  Hospitalist services were contacted for further evaluation.  PAST MEDICAL HISTORY:   Past Medical History:  Diagnosis Date  . Basal cell carcinoma of nose 2015   ([name])  . Cancer (HCC)    Hx: of squamous cell left calf  . Diverticulosis 2005   by colonoscopy  . History of chicken pox   . 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 History of pneumothorax 1982   rib fracture, from a fall  . 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 Lower back pain 2012   thought HNP, improved with PT  . Osteoarthritis of left hip   . Pneumothorax, acute    Hx: of    PAST SURGICAL HISTORY:   Past Surgical History:  Procedure Laterality Date  .  CATARACT EXTRACTION Bilateral 10/2014  . cataracts    . CHEST TUBE INSERTION  1982  . COLONOSCOPY  07/2003   diverticulosis, int hem ([facility])  . [place]  . LASIK    . SKIN CANCER EXCISION  1993, 2014   Left calf  . TOTAL HIP ARTHROPLASTY Left 02/03/2013   [name], MD  . TRIGGER FINGER RELEASE  2001    SOCIAL HISTORY:   Social History   Tobacco Use  . Smoking status: Never Smoker  . Smokeless tobacco: Never Used  Substance Use Topics  . Alcohol use: Yes    Comment: Occasional    FAMILY HISTORY:   Family History  Problem Relation Age of Onset  . Coronary artery disease Mother 23  . Hypertension Father   . CVA Father   . Healthy Sister   . Healthy Brother   . Diabetes Neg Hx   . Cancer Neg Hx     DRUG ALLERGIES:   Allergies  Allergen Reactions  . Sulfa Drugs Cross Reactors Hives    REVIEW OF SYSTEMS:  CONSTITUTIONAL: No fever, fatigue or weakness.  EYES: No blurred or double vision.  EARS, NOSE, AND THROAT: No tinnitus  or ear pain. No sore throat RESPIRATORY: No cough, shortness of breath, wheezing or hemoptysis.  CARDIOVASCULAR: No chest pain, orthopnea, edema.  GASTROINTESTINAL: No nausea, vomiting, diarrhea or abdominal pain. No blood in bowel movements GENITOURINARY: No dysuria, hematuria.  ENDOCRINE: No polyuria, nocturia,  HEMATOLOGY: No anemia, easy bruising or bleeding SKIN: No rash or lesion. MUSCULOSKELETAL: No joint pain or arthritis.   NEUROLOGIC: No tingling, numbness, weakness.  PSYCHIATRY: No anxiety or depression.   MEDICATIONS AT HOME:   Prior to Admission medications   Not on File   Patient does not take any medications  VITAL SIGNS:  Blood pressure (!) 84/60, pulse 81, temperature 98.6 F (37 C), temperature source Oral, resp. rate (!) 21, height 5\' 11"  (1.803 m), weight 83.5 kg, SpO2 96 %.  PHYSICAL EXAMINATION:  GENERAL:  77 y.o.-year-old patient lying in the bed with no acute distress.  EYES: Pupils  equal, round, reactive to light and accommodation. No scleral icterus. Extraocular muscles intact.  Tympanic membrane bilaterally cloudy and left tympanic membrane erythematous HEENT: Head atraumatic, normocephalic. Oropharynx and nasopharynx clear.  NECK:  Supple, no jugular venous distention. No thyroid enlargement, no tenderness.  LUNGS: Normal breath sounds bilaterally, no wheezing, rales,rhonchi or crepitation. No use of accessory muscles of respiration.  CARDIOVASCULAR: S1, S2 normal. No murmurs, rubs, or gallops.  ABDOMEN: Soft, nontender, nondistended. Bowel sounds present. No organomegaly or mass.  EXTREMITIES: No pedal edema, cyanosis, or clubbing.  NEUROLOGIC: Cranial nerves II through XII are intact. Muscle strength 5/5 in all extremities. Sensation intact. Gait not checked.  PSYCHIATRIC: The patient is alert and oriented x 3.  SKIN: Right shoulder no signs of infection.  LABORATORY PANEL:   CBC Recent Labs  Lab 01/18/18 1315  WBC 9.4  HGB 12.7*  HCT 36.8*  PLT 171   ------------------------------------------------------------------------------------------------------------------  Chemistries  Recent Labs  Lab 01/18/18 1315  NA 138  K 3.7  CL 105  CO2 25  GLUCOSE 114*  BUN 28*  CREATININE 1.32*  CALCIUM 9.0   ------------------------------------------------------------------------------------------------------------------  Cardiac Enzymes Recent Labs  Lab 01/18/18 1315  TROPONINI <0.03   ------------------------------------------------------------------------------------------------------------------  RADIOLOGY:  Ct Head Wo Contrast  Result Date: 01/18/2018 CLINICAL DATA:  Nausea, vomiting and diarrhea since 4 a.m. Hypotensive and diaphoretic. EXAM: CT HEAD WITHOUT CONTRAST TECHNIQUE: Contiguous axial images were obtained from the base of the skull through the vertex without intravenous contrast. COMPARISON:  None. FINDINGS: Brain: The ventricles are in  the midline without mass effect or shift. They are normal in size and configuration for age. No extra-axial fluid collections are identified. No CT findings for an acute intracranial process such as hemispheric infarction or intracranial hemorrhage. The gray-white differentiation is maintained. No mass lesions. The brainstem and cerebellum appear normal. Vascular: No hyperdense vessel or unexpected calcification. Skull: No skull fracture or bone lesion. Sinuses/Orbits: The paranasal sinuses and mastoid air cells are clear except for scattered ethmoid air cell mucoperiosteal thickening. The globes are intact. In Other: No scalp lesions or hematoma. IMPRESSION: No acute intracranial findings or mass lesions. Electronically Signed   By: Marijo Sanes M.D.   On: 01/18/2018 14:16    EKG:   Normal sinus rhythm 96 bpm no acute ST-T wave changes  IMPRESSION AND PLAN:   1.  Orthostatic hypotension and dehydration.  IV fluid hydration overnight.  Check an a.m. cortisol and TSH in the morning.  Continue to check orthostatic vital signs. 2.  Otitis media start Augmentin 3.  Nausea vomiting diarrhea PRN nausea medications  IV fluids.  If the patient has any further diarrhea can send off stool studies. 4.  Recent melanoma taken off his right shoulder.  No signs of infection there.  If the patient has any fever can send off blood cultures. 5.  Hematuria.  Patient states that he has had this in the past.  We will get a CT scan renal stone protocol just to make sure that a kidney stone is not the cause of his issues.   All the records are reviewed and case discussed with ED provider. Management plans discussed with the patient, family and they are in agreement.  CODE STATUS: Full code  TOTAL TIME TAKING CARE OF THIS PATIENT: 50 minutes.    Loletha Grayer M.D on 01/18/2018 at 7:21 PM  Between 7am to 6pm - Pager - [phone]  After 6pm call admission pager [phone]  [facility]   [phone]  CC: Primary care physician; Ria Bush, MD

## 2018-01-18 NOTE — ED Triage Notes (Signed)
Patient reports this morning approximately 4 am he started to feel very nauseous with vomiting and diarrhea. States he went to bed after vomiting and when he got up he was unable to walk to restroom and when wife took BP he had pressure of 90/50. Currently complaining of nausea, diaphoresis and unsteady feeling. Denies any chest pain or shortness of breath.

## 2018-01-18 NOTE — ED Notes (Signed)
Pt asking for results, per Dr. Kerman Passey, will be in to discuss results with patient ASAP. Pt requesting something to eat and something for pain. Will discuss with MD.

## 2018-01-19 DIAGNOSIS — R112 Nausea with vomiting, unspecified: Secondary | ICD-10-CM | POA: Diagnosis not present

## 2018-01-19 DIAGNOSIS — Z9889 Other specified postprocedural states: Secondary | ICD-10-CM | POA: Diagnosis not present

## 2018-01-19 DIAGNOSIS — I951 Orthostatic hypotension: Secondary | ICD-10-CM | POA: Diagnosis not present

## 2018-01-19 DIAGNOSIS — E860 Dehydration: Secondary | ICD-10-CM | POA: Diagnosis not present

## 2018-01-19 LAB — C DIFFICILE QUICK SCREEN W PCR REFLEX
C Diff antigen: NEGATIVE
C Diff interpretation: NOT DETECTED
C Diff toxin: NEGATIVE

## 2018-01-19 LAB — GASTROINTESTINAL PANEL BY PCR, STOOL (REPLACES STOOL CULTURE)

## 2018-01-19 LAB — BASIC METABOLIC PANEL
Anion gap: 4 — ABNORMAL LOW (ref 5–15)
BUN: 28 mg/dL — ABNORMAL HIGH (ref 8–23)
CO2: 22 mmol/L (ref 22–32)
Calcium: 7.5 mg/dL — ABNORMAL LOW (ref 8.9–10.3)
Chloride: 112 mmol/L — ABNORMAL HIGH (ref 98–111)
Creatinine, Ser: 1.21 mg/dL (ref 0.61–1.24)
GFR calc Af Amer: >60 mL/min (ref >60)
GFR calc non Af Amer: 55 mL/min — ABNORMAL LOW (ref >60)
Glucose, Bld: 95 mg/dL (ref 70–99)
Potassium: 3.5 mmol/L (ref 3.5–5.1)
Sodium: 138 mmol/L (ref 135–145)

## 2018-01-19 LAB — CBC
HCT: 33.6 % — ABNORMAL LOW (ref 39.0–52.0)
Hemoglobin: 11.3 g/dL — ABNORMAL LOW (ref 13.0–17.0)
MCH: 31.7 pg (ref 26.0–34.0)
MCHC: 33.6 g/dL (ref 30.0–36.0)
MCV: 94.1 fL (ref 80.0–100.0)
Platelets: 143 10*3/uL — ABNORMAL LOW (ref 150–400)
RBC: 3.57 MIL/uL — ABNORMAL LOW (ref 4.22–5.81)
RDW: 12.3 % (ref 11.5–15.5)
WBC: 9.8 10*3/uL (ref 4.0–10.5)
nRBC: 0 % (ref 0.0–0.2)

## 2018-01-19 LAB — TSH: TSH: 0.915 u[IU]/mL (ref 0.350–4.500)

## 2018-01-19 LAB — CORTISOL-AM, BLOOD: Cortisol - AM: 32.2 ug/dL — ABNORMAL HIGH (ref 6.7–22.6)

## 2018-01-19 LAB — ALBUMIN: Albumin: 2.8 g/dL — ABNORMAL LOW (ref 3.5–5.0)

## 2018-01-19 MED ORDER — HYDROCORTISONE NA SUCCINATE PF 100 MG IJ SOLR
50.0000 mg | Freq: Four times a day (QID) | INTRAMUSCULAR | Status: DC
  Administered 2018-01-19 – 2018-01-20 (×4): 50 mg via INTRAVENOUS
  Filled 2018-01-19 (×5): qty 1

## 2018-01-19 NOTE — Care Management Note (Signed)
Case Management Note  Patient Details  Name: Samuel Matthews MRN: [id number] Date of Birth: 02/22/1941  Subjective/Objective:   Admitted to [facility] under observation status with the diagnosis  Of orthostatic hypotension. Lives with wife, Peter Congo [phone]). Prescriptions are filled at [facility], Last seen Dr, Danise Mina 12/16/17. No medical equipment in the home. Takes care of all basic activities of daily living himself, drives. Works part time at [employer].  Excision of right shoulder melanoma 01/17/18                Action/Plan: No discharge plans noted at this time.   Expected Discharge Date:                  Expected Discharge Plan:     In-House Referral:   yes  Discharge planning Services   yes  Post Acute Care Choice:    Choice offered to:     DME Arranged:    DME Agency:     HH Arranged:    HH Agency:     Status of Service:     If discussed at [employer] of Stay Meetings, dates discussed:    Additional Comments:  Shelbie Ammons, RN MSN CCM Care Management [phone] 01/19/2018, 9:14 AM

## 2018-01-19 NOTE — Consult Note (Signed)
[name] Nurse wound consult note Patient has received instructions from the Dermatologist that performed the surgery on his back.  Please confer with the MD that performed the surgery and find out what is supposed to be done for the site.  WOC is signing off on this consult.  Val Riles, RN, MSN, CWOCN, CNS-BC, pager [phone]

## 2018-01-19 NOTE — Care Management Obs Status (Signed)
[place] NOTIFICATION   Patient Details  Name: Samuel Matthews MRN: [id number] Date of Birth: [birth year]   Medicare Observation Status Notification Given:  Yes    Shelbie Ammons, RN 01/19/2018, 8:26 AM

## 2018-01-19 NOTE — Progress Notes (Signed)
[place] at [place] NAME: Samuel Matthews    MR#:  [id number]  DATE OF BIRTH:  February 15, 1941  SUBJECTIVE:  CHIEF COMPLAINT:   Chief Complaint  Patient presents with  . Weakness   -Still feels significantly dizzy and lightheaded on getting up.  Feels rundown.  Still having diarrhea.  REVIEW OF SYSTEMS:  Review of Systems  Constitutional: Positive for malaise/fatigue. Negative for chills and fever.  Eyes: Negative for blurred vision and double vision.  Respiratory: Negative for cough, shortness of breath and wheezing.   Cardiovascular: Negative for chest pain and palpitations.  Gastrointestinal: Positive for diarrhea and nausea. Negative for abdominal pain, constipation and vomiting.  Genitourinary: Negative for dysuria.  Musculoskeletal: Negative for myalgias.  Neurological: Positive for dizziness. Negative for focal weakness, seizures, weakness and headaches.  Psychiatric/Behavioral: Negative for depression.    DRUG ALLERGIES:   Allergies  Allergen Reactions  . Sulfa Drugs Cross Reactors Hives    VITALS:  Blood pressure (!) 91/45, pulse 86, temperature 98.6 F (37 C), temperature source Oral, resp. rate 20, height 5\' 11"  (1.803 m), weight 88.7 kg, SpO2 94 %.  PHYSICAL EXAMINATION:  Physical Exam  GENERAL:  77 y.o.-year-old elderly patient lying in the bed with no acute distress.  EYES: Pupils equal, round, reactive to light and accommodation. No scleral icterus. Extraocular muscles intact.  HEENT: Head atraumatic, normocephalic. Oropharynx and nasopharynx clear.  NECK:  Supple, no jugular venous distention. No thyroid enlargement, no tenderness.  LUNGS: Normal breath sounds bilaterally, no wheezing, rales,rhonchi or crepitation. No use of accessory muscles of respiration.  CARDIOVASCULAR: S1, S2 normal. No murmurs, rubs, or gallops.  ABDOMEN: Soft, nontender, nondistended. Bowel sounds present. No organomegaly or mass.    EXTREMITIES: No pedal edema, cyanosis, or clubbing.  NEUROLOGIC: Cranial nerves II through XII are intact. Muscle strength 5/5 in all extremities. Sensation intact. Gait not checked.  PSYCHIATRIC: The patient is alert and oriented x 3.  SKIN: No obvious rash, lesion, or ulcer.    LABORATORY PANEL:   CBC Recent Labs  Lab 01/19/18 0540  WBC 9.8  HGB 11.3*  HCT 33.6*  PLT 143*   ------------------------------------------------------------------------------------------------------------------  Chemistries  Recent Labs  Lab 01/19/18 0540  NA 138  K 3.5  CL 112*  CO2 22  GLUCOSE 95  BUN 28*  CREATININE 1.21  CALCIUM 7.5*   ------------------------------------------------------------------------------------------------------------------  Cardiac Enzymes Recent Labs  Lab 01/18/18 1315  TROPONINI <0.03   ------------------------------------------------------------------------------------------------------------------  RADIOLOGY:  Ct Head Wo Contrast  Result Date: 01/18/2018 CLINICAL DATA:  Nausea, vomiting and diarrhea since 4 a.m. Hypotensive and diaphoretic. EXAM: CT HEAD WITHOUT CONTRAST TECHNIQUE: Contiguous axial images were obtained from the base of the skull through the vertex without intravenous contrast. COMPARISON:  None. FINDINGS: Brain: The ventricles are in the midline without mass effect or shift. They are normal in size and configuration for age. No extra-axial fluid collections are identified. No CT findings for an acute intracranial process such as hemispheric infarction or intracranial hemorrhage. The gray-white differentiation is maintained. No mass lesions. The brainstem and cerebellum appear normal. Vascular: No hyperdense vessel or unexpected calcification. Skull: No skull fracture or bone lesion. Sinuses/Orbits: The paranasal sinuses and mastoid air cells are clear except for scattered ethmoid air cell mucoperiosteal thickening. The globes are intact. In  Other: No scalp lesions or hematoma. IMPRESSION: No acute intracranial findings or mass lesions. Electronically Signed   By: Marijo Sanes M.D.   On: 01/18/2018 14:16  Ct Renal Stone Study  Result Date: 01/18/2018 CLINICAL DATA:  77 year old male with history of nausea, vomiting and diarrhea since 4 a.m. this morning. Diaphoresis. EXAM: CT ABDOMEN AND PELVIS WITHOUT CONTRAST TECHNIQUE: Multidetector CT imaging of the abdomen and pelvis was performed following the standard protocol without IV contrast. COMPARISON:  None. FINDINGS: Lower chest: Unremarkable. Hepatobiliary: No definite cystic or solid hepatic lesions are confidently identified on today's noncontrast CT examination. Unenhanced appearance of the gallbladder is normal. Pancreas: No definite pancreatic mass or peripancreatic fluid or inflammatory changes are noted on today's noncontrast CT examination. Spleen: Unremarkable. Adrenals/Urinary Tract: No calcifications are noted within the collecting system of either kidney, along the course of either ureter, or within the lumen of the urinary bladder. No hydroureteronephrosis. 2.7 cm low-attenuation lesion protruding into the inferior aspect of the left renal pelvis, incompletely characterized on today's noncontrast CT examination, but statistically likely to represent a peripelvic cyst. Right kidney and bilateral adrenal glands are normal in appearance. Unenhanced appearance of the urinary bladder is normal in appearance. Stomach/Bowel: Normal appearance of the stomach. No pathologic dilatation of small bowel or colon. Numerous colonic diverticulae are noted, without surrounding inflammatory changes to suggest an acute diverticulitis at this time. Normal appendix. Vascular/Lymphatic: Aortic atherosclerosis. No lymphadenopathy noted in the abdomen or pelvis. Reproductive: Prostate gland and seminal vesicles are unremarkable in appearance. Other: No significant volume of ascites.  No pneumoperitoneum.  Musculoskeletal: Status post left hip arthroplasty. There are no aggressive appearing lytic or blastic lesions noted in the visualized portions of the skeleton. IMPRESSION: 1. No acute findings are noted in the abdomen or pelvis to account for the patient's symptoms. 2. Extensive colonic diverticulosis without findings to suggest an acute diverticulitis at this time. 3. Aortic atherosclerosis. 4. Additional incidental findings, as above. Electronically Signed   By: Vinnie Langton M.D.   On: 01/18/2018 20:22    EKG:   Orders placed or performed during the hospital encounter of 01/18/18  . EKG 12-Lead  . EKG 12-Lead  . ED EKG  . ED EKG    ASSESSMENT AND PLAN:   77 year old male with no significant past medical history other than recent excision of a possible malignant melanoma from his right shoulder presents to hospital secondary to significant dizziness and lightheadedness.  1.  Orthostatic hypotension-significantly hypotensive. No infection has been identified.  Continue IV fluids -Start Solu-Cortef -Continue to check orthostatics. -TSH and random cortisol ordered  2.  Acute sinusitis-based on CT findings.  Started on Augmentin.  3.  Melanoma-biopsy done 2 weeks ago, results required deep removal which was done 2 days ago.  Continue to follow-up with dermatology  4.  DVT prophylaxis-Lovenox.  Patient is very independent and ambulatory at baseline   All the records are reviewed and case discussed with Care Management/Social Workerr. Management plans discussed with the patient, family and they are in agreement.  CODE STATUS: Full Code  TOTAL TIME TAKING CARE OF THIS PATIENT: 37 minutes.   POSSIBLE D/C IN 2 DAYS, DEPENDING ON CLINICAL CONDITION.   Gladstone Lighter M.D on 01/19/2018 at 11:06 AM  Between 7am to 6pm - Pager - [phone]  After 6pm go to [email or web address] - password [email or web address] [facility]  Office  [phone]  CC: Primary care physician;  Ria Bush, MD

## 2018-01-20 DIAGNOSIS — Z96642 Presence of left artificial hip joint: Secondary | ICD-10-CM | POA: Diagnosis present

## 2018-01-20 DIAGNOSIS — Z86006 Personal history of melanoma in-situ: Secondary | ICD-10-CM | POA: Diagnosis not present

## 2018-01-20 DIAGNOSIS — R112 Nausea with vomiting, unspecified: Secondary | ICD-10-CM | POA: Diagnosis not present

## 2018-01-20 DIAGNOSIS — J019 Acute sinusitis, unspecified: Secondary | ICD-10-CM | POA: Diagnosis present

## 2018-01-20 DIAGNOSIS — I951 Orthostatic hypotension: Secondary | ICD-10-CM | POA: Diagnosis not present

## 2018-01-20 DIAGNOSIS — Z9889 Other specified postprocedural states: Secondary | ICD-10-CM | POA: Diagnosis not present

## 2018-01-20 DIAGNOSIS — H6690 Otitis media, unspecified, unspecified ear: Secondary | ICD-10-CM | POA: Diagnosis present

## 2018-01-20 DIAGNOSIS — E860 Dehydration: Secondary | ICD-10-CM | POA: Diagnosis not present

## 2018-01-20 DIAGNOSIS — I959 Hypotension, unspecified: Secondary | ICD-10-CM | POA: Diagnosis not present

## 2018-01-20 DIAGNOSIS — Z882 Allergy status to sulfonamides status: Secondary | ICD-10-CM | POA: Diagnosis not present

## 2018-01-20 LAB — URINE CULTURE: Culture: NO GROWTH

## 2018-01-20 MED ORDER — HYDROCORTISONE NA SUCCINATE PF 100 MG IJ SOLR
50.0000 mg | Freq: Two times a day (BID) | INTRAMUSCULAR | Status: DC
  Filled 2018-01-20: qty 1

## 2018-01-20 MED ORDER — AMOXICILLIN-POT CLAVULANATE 875-125 MG PO TABS: 1.0000 | ORAL_TABLET | Freq: Two times a day (BID) | ORAL | 0 refills | Status: AC

## 2018-01-20 NOTE — Discharge Summary (Signed)
[facility] NAME: Samuel Matthews    MR#:  [id number]  DATE OF BIRTH:  07-Oct-1940  DATE OF ADMISSION:  01/18/2018   ADMITTING PHYSICIAN: Loletha Grayer, MD  DATE OF DISCHARGE:  01/20/18  PRIMARY CARE PHYSICIAN: Ria Bush, MD   ADMISSION DIAGNOSIS:   Dehydration [E86.0] Weakness [R53.1] Near syncope [R55] Nausea vomiting and diarrhea [R11.2, R19.7]  DISCHARGE DIAGNOSIS:   Active Problems:   Orthostatic hypotension   SECONDARY DIAGNOSIS:   Past Medical History:  Diagnosis Date  . Basal cell carcinoma of nose 2015   ([name])  . Cancer (HCC)    Hx: of squamous cell left calf  . Diverticulosis 2005   by colonoscopy  . History of chicken pox   . 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 History of pneumothorax 1982   rib fracture, from a fall  . 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 Lower back pain 2012   thought HNP, improved with PT  . Osteoarthritis of left hip   . Pneumothorax, acute    Hx: of    HOSPITAL COURSE:   77 year old male with no significant past medical history other than recent excision of a possible malignant melanoma from his right shoulder presents to hospital secondary to significant dizziness and lightheadedness.  1.  Orthostatic hypotension- significantly hypotensive. -Not sure if this was a stress reaction, cortisol levels are within normal limits. -Did receive lidocaine and epinephrine for his deep biopsy not sure if any systemic effect from that No infection has been identified. -No other source has been identified.  Improved with few doses of IV Solu-Cortef and fluids. -Significantly orthostatic when he came in, currently none. -Ambulate and possible discharge today.  TSH is within normal limits  2.  Acute sinusitis-based on CT findings.  on Augmentin.  Finish off the course  3.  Melanoma-biopsy done 2 weeks  ago, results required deep removal which was done 2 days ago.  Continue to follow-up with dermatology   Patient is very independent and ambulatory at baseline Discharge today   DISCHARGE CONDITIONS:   Guarded  CONSULTS OBTAINED:   None  DRUG ALLERGIES:   Allergies  Allergen Reactions  . Sulfa Drugs Cross Reactors Hives   DISCHARGE MEDICATIONS:   Allergies as of 01/20/2018      Reactions   Sulfa Drugs Cross Reactors Hives      Medication List    TAKE these medications   amoxicillin-clavulanate 875-125 MG tablet Commonly known as:  AUGMENTIN Take 1 tablet by mouth every 12 (twelve) hours for 5 days.        DISCHARGE INSTRUCTIONS:   1. PCP f/u in 1 week  DIET:   Regular diet  ACTIVITY:   Activity as tolerated  OXYGEN:   Home Oxygen: No.  Oxygen Delivery: room air  DISCHARGE LOCATION:   home   If you experience worsening of your admission symptoms, develop shortness of breath, life threatening emergency, suicidal or homicidal thoughts you must seek medical attention immediately by calling 911 or calling your MD immediately  if symptoms less severe.  You Must read complete instructions/literature along with all the possible adverse reactions/side effects for all the Medicines you take and that have been prescribed to you. Take any new Medicines after you have completely understood and accpet all the possible adverse reactions/side effects.  Please note  You were cared for by a hospitalist during your hospital stay. If you have any questions about your discharge medications or the care you received while you were in the hospital after you are discharged, you can call the unit and asked to speak with the hospitalist on call if the hospitalist that took care of you is not available. Once you are discharged, your primary care physician will handle any further medical issues. Please note that NO REFILLS for any discharge medications will be authorized once you  are discharged, as it is imperative that you return to your primary care physician (or establish a relationship with a primary care physician if you do not have one) for your aftercare needs so that they can reassess your need for medications and monitor your lab values.    On the day of Discharge:  VITAL SIGNS:   Blood pressure 109/62, pulse 71, temperature 97.9 F (36.6 C), temperature source Oral, resp. rate 18, height 5\' 11"  (1.803 m), weight 88.7 kg, SpO2 97 %.  PHYSICAL EXAMINATION:    GENERAL:  77 y.o.-year-old elderly patient lying in the bed with no acute distress.  EYES: Pupils equal, round, reactive to light and accommodation. No scleral icterus. Extraocular muscles intact.  HEENT: Head atraumatic, normocephalic. Oropharynx and nasopharynx clear.  NECK:  Supple, no jugular venous distention. No thyroid enlargement, no tenderness.  LUNGS: Normal breath sounds bilaterally, no wheezing, rales,rhonchi or crepitation. No use of accessory muscles of respiration.  CARDIOVASCULAR: S1, S2 normal. No murmurs, rubs, or gallops.  ABDOMEN: Soft, nontender, nondistended. Bowel sounds present. No organomegaly or mass.  EXTREMITIES: No pedal edema, cyanosis, or clubbing.  NEUROLOGIC: Cranial nerves II through XII are intact. Muscle strength 5/5 in all extremities. Sensation intact. Gait not checked.  PSYCHIATRIC: The patient is alert and oriented x 3.  SKIN: No obvious rash, lesion, or ulcer.    DATA REVIEW:   CBC Recent Labs  Lab 01/19/18 0540  WBC 9.8  HGB 11.3*  HCT 33.6*  PLT 143*    Chemistries  Recent Labs  Lab 01/19/18 0540  NA 138  K 3.5  CL 112*  CO2 22  GLUCOSE 95  BUN 28*  CREATININE 1.21  CALCIUM 7.5*     Microbiology Results  Results for orders placed or performed during the hospital encounter of 01/18/18  Gastrointestinal Panel by PCR , Stool     Status: None   Collection Time: 01/18/18  1:53 PM  Result Value Ref Range Status   Campylobacter species  NOT DETECTED NOT DETECTED Final   Plesimonas shigelloides NOT DETECTED NOT DETECTED Final   Salmonella species NOT DETECTED NOT DETECTED Final   Yersinia enterocolitica NOT DETECTED NOT DETECTED Final   Vibrio species NOT DETECTED NOT DETECTED Final   Vibrio cholerae NOT DETECTED NOT DETECTED Final   Enteroaggregative E coli (EAEC) NOT DETECTED NOT DETECTED Final   Enteropathogenic E coli (EPEC) NOT DETECTED NOT DETECTED Final   Enterotoxigenic E coli (ETEC) NOT DETECTED NOT DETECTED Final   Shiga like toxin producing E coli (STEC) NOT DETECTED NOT DETECTED Final   Shigella/Enteroinvasive E coli (EIEC) NOT DETECTED NOT DETECTED Final   Cryptosporidium NOT DETECTED NOT DETECTED Final   Cyclospora cayetanensis NOT DETECTED NOT DETECTED Final   Entamoeba histolytica NOT DETECTED NOT DETECTED Final   Giardia lamblia NOT DETECTED NOT DETECTED Final   Adenovirus F40/41 NOT DETECTED NOT DETECTED Final   Astrovirus NOT DETECTED NOT DETECTED Final   Norovirus GI/GII NOT  DETECTED NOT DETECTED Final   Rotavirus A NOT DETECTED NOT DETECTED Final   Sapovirus (I, II, IV, and V) NOT DETECTED NOT DETECTED Final    Comment: Performed at [facility], [address], [place], [place] [address]  Urine Culture     Status: None   Collection Time: 01/18/18  6:07 PM  Result Value Ref Range Status   Specimen Description   Final    URINE, RANDOM Performed at [facility], [address] [place], [place] [address]    Special Requests   Final    NONE Performed at [facility], [address], [place], [place] [address]    Culture   Final    NO GROWTH Performed at [facility], [place] [address], [place], [place] [address]    Report Status 01/20/2018 FINAL  Final  C difficile quick scan w PCR reflex     Status: None   Collection Time: 01/19/18  1:53 PM  Result Value Ref Range Status   C Diff antigen NEGATIVE NEGATIVE Final   C Diff toxin NEGATIVE NEGATIVE Final   C  Diff interpretation No C. difficile detected.  Final    Comment: Performed at [facility], [address] [place], [place] [address]    RADIOLOGY:  No results found.   Management plans discussed with the patient, family and they are in agreement.  CODE STATUS:     Code Status Orders  (From admission, onward)         Start     Ordered   01/18/18 1916  Full code  Continuous     01/18/18 1916        Code Status History    Date Active Date Inactive Code Status Order ID Comments User Context   02/03/2013 1613 02/05/2013 1614 Full Code [id number]  [name], MD Inpatient    Advance Directive Documentation     Most Recent Value  Type of Advance Directive  Living will, Healthcare Power of Attorney  Pre-existing out of facility DNR order (yellow form or pink MOST form)  -  "MOST" Form in Place?  -      TOTAL TIME TAKING CARE OF THIS PATIENT:  38 minutes.    Gladstone Lighter M.D on 01/20/2018 at 10:43 AM  Between 7am to 6pm - Pager - [phone]  After 6pm go to [email or web address] - Proofreader  [facility]  Office  [phone]  CC: Primary care physician; Ria Bush, MD   Note: This dictation was prepared with Dragon dictation along with smaller phrase technology. Any transcriptional errors that result from this process are unintentional.

## 2018-01-21 ENCOUNTER — Telehealth: Payer: Self-pay | Admitting: *Deleted

## 2018-01-21 NOTE — Telephone Encounter (Signed)
Transition Care Management Follow-up Telephone Call   Date discharged? 01/20/18   How have you been since you were released from the hospital? "Doing just fine"   Do you understand why you were in the hospital? YES   Do you understand the discharge instructions? YES   Where were you discharged to? HOME   Items Reviewed:  Medications reviewed: YES  Allergies reviewed: YES  Dietary changes reviewed: YES  Referrals reviewed: YES   Functional Questionnaire:   Activities of Daily Living (ADLs):   States he is totally independent and can do all things for himself  States they require assistance with the following: Nothing  Any transportation issues/concerns?: NO   Any patient concerns? NO   Confirmed importance and date/time of follow-up visits scheduled YES    Provider Appointment booked with Dr. Danise Mina on 01/24/18  Confirmed with patient if condition begins to worsen call PCP or go to the ER.  Patient was given the office number and encouraged to call back with question or concerns.  YES

## 2018-01-23 ENCOUNTER — Encounter: Payer: Self-pay | Admitting: Family Medicine

## 2018-01-23 NOTE — Progress Notes (Signed)
BP 130/62 (BP Location: Left Arm, Patient Position: Sitting, Cuff Size: Normal)   Pulse (!) 59   Temp 98.6 F (37 C) (Oral)   Ht 5\' 10"  (1.778 m)   Wt 198 lb 4 oz (89.9 kg)   SpO2 97%   BMI 28.45 kg/m    CC: hospital f/u TCM visit Subjective:    Patient ID: Samuel Matthews, male    DOB: Dec 03, 1940, 77 y.o.   MRN: [id number]  HPI: Samuel Matthews is a 77 y.o. male presenting on 01/24/2018 for Hospitalization Follow-up (Pt accompanied by his wife. )   Recent hospitalization for orthostatic hypotension associated with dizziness, dehydration. This was in setting of possible recent R shoulder melanoma excision and lidocaine/epi local anesthesia. Treated with solu-cortef and IVF. He was also found to have ?acute sinusitis by head CT (scattered ethmoid air cell mucoperiosteal thickening) - treated with augmentin course. Renal stone CT negative (2.7cm lesion protruding into L renal pelvis thought peripelvic cyst). Tested negative for GI infection. EKG - PACs.   Normal telemetry.  BP down to 91/50. Had trouble getting out of bed even in hospital.  Received 8 bags of IVF without significant improvement. Discharged 9lbs weight gain.  He only started feeling better when he received IV steroids.  No hypoglycemia or low temperatures during hospitalization.  No chest pain, dyspnea, dizziness, palpitations.   He may have been dehydrated - more active over the last few months.   He did have episode 40 yrs ago after traumatic lung injury s/p chest tube placement - ended up with low blood pressures for 2 days, unsure how this was treated.   He has had other surgeries without concerns including hip replacement surgery 2014.   DATE OF ADMISSION:  01/18/2018     DATE OF DISCHARGE:  01/20/18 TCM f/u phone call completed: 01/21/2018  D/C diagnosis: Active Problems:   Orthostatic hypotension  rec f/u with PCP in 1 week.   Relevant past medical, surgical, family and social history reviewed and updated  as indicated. Interim medical history since our last visit reviewed. Allergies and medications reviewed and updated. Outpatient Medications Prior to Visit  Medication Sig Dispense Refill  . amoxicillin-clavulanate (AUGMENTIN) 875-125 MG tablet Take 1 tablet by mouth every 12 (twelve) hours for 5 days. 10 tablet 0   No facility-administered medications prior to visit.      Per HPI unless specifically indicated in ROS section below Review of Systems     Objective:    BP 130/62 (BP Location: Left Arm, Patient Position: Sitting, Cuff Size: Normal)   Pulse (!) 59   Temp 98.6 F (37 C) (Oral)   Ht 5\' 10"  (1.778 m)   Wt 198 lb 4 oz (89.9 kg)   SpO2 97%   BMI 28.45 kg/m   Wt Readings from Last 3 Encounters:  01/24/18 198 lb 4 oz (89.9 kg)  01/18/18 195 lb 8 oz (88.7 kg)  12/16/17 189 lb 8 oz (86 kg)    Physical Exam  Constitutional: He appears well-developed and well-nourished. No distress.  HENT:  Mouth/Throat: Oropharynx is clear and moist. No oropharyngeal exudate.  Eyes: Pupils are equal, round, and reactive to light. EOM are normal.  Neck: Normal range of motion. Neck supple. No thyromegaly present.  Cardiovascular: Normal rate, regular rhythm and normal heart sounds.  No murmur heard. Pulmonary/Chest: Effort normal and breath sounds normal. No respiratory distress. He has no wheezes. He has no rales.  Abdominal: Soft. Bowel sounds are normal.  He exhibits no distension and no mass. There is no tenderness. There is no rebound and no guarding. No hernia.  Musculoskeletal: He exhibits no edema.  Lymphadenopathy:    He has no cervical adenopathy.  Skin: Skin is warm and dry. No rash noted.  Psychiatric: He has a normal mood and affect.  Nursing note and vitals reviewed.   Lab Results  Component Value Date   TSH 0.915 01/19/2018   Lab Results  Component Value Date   CREATININE 1.21 01/19/2018   BUN 28 (H) 01/19/2018   NA 138 01/19/2018   K 3.5 01/19/2018   CL 112 (H)  01/19/2018   CO2 22 01/19/2018    Lab Results  Component Value Date   ALT 16 07/04/2015   AST 22 07/04/2015   ALKPHOS 38 (L) 07/04/2015   BILITOT 0.4 07/04/2015   Lab Results  Component Value Date   LABPROT 12.5 01/25/2013       Assessment & Plan:   Problem List Items Addressed This Visit    Right renal mass    Thought peripelvic cyst. Consider renal US at f/u.       Orthostatic hypotension - Primary    Reviewed hospital records with patient. ?dehydration related. Possible adrenal insufficiency given only started improving with IV steroids - will have him return for 8am cortisol level.  Reviewed importance of good hydration status with water or electrolyte rich fluid. Doubt cardiac related - no cause found on telemetry. Will continue to monitor symptoms for now.       Relevant Orders   CBC with Differential/Platelet   Comprehensive metabolic panel   Cortisol-am, blood   History of melanoma in situ    Had just had another melanoma removed from R upper back 01/2018          No orders of the defined types were placed in this encounter.  Orders Placed This Encounter  Procedures  . CBC with Differential/Platelet    Standing Status:   Future    Standing Expiration Date:   01/25/2019  . Comprehensive metabolic panel    Standing Status:   Future    Standing Expiration Date:   01/25/2019  . Cortisol-am, blood    Standing Status:   Future    Standing Expiration Date:   01/25/2019    Follow up plan: Return for follow up visit.  Ria Bush, MD

## 2018-01-24 ENCOUNTER — Encounter: Payer: Self-pay | Admitting: Family Medicine

## 2018-01-24 ENCOUNTER — Ambulatory Visit (INDEPENDENT_AMBULATORY_CARE_PROVIDER_SITE_OTHER): Payer: Medicare Other | Admitting: Family Medicine

## 2018-01-24 VITALS — BP 130/62 | HR 59 | Temp 98.6°F | Ht 70.0 in | Wt 198.2 lb

## 2018-01-24 DIAGNOSIS — I951 Orthostatic hypotension: Secondary | ICD-10-CM

## 2018-01-24 DIAGNOSIS — N2889 Other specified disorders of kidney and ureter: Secondary | ICD-10-CM

## 2018-01-24 DIAGNOSIS — Z86006 Personal history of melanoma in-situ: Secondary | ICD-10-CM

## 2018-01-24 DIAGNOSIS — D0359 Melanoma in situ of other part of trunk: Secondary | ICD-10-CM | POA: Diagnosis not present

## 2018-01-24 DIAGNOSIS — N281 Cyst of kidney, acquired: Secondary | ICD-10-CM | POA: Insufficient documentation

## 2018-01-24 DIAGNOSIS — L089 Local infection of the skin and subcutaneous tissue, unspecified: Secondary | ICD-10-CM | POA: Diagnosis not present

## 2018-01-24 NOTE — Assessment & Plan Note (Signed)
Reviewed hospital records with patient. ?dehydration related. Possible adrenal insufficiency given only started improving with IV steroids - will have him return for 8am cortisol level.  Reviewed importance of good hydration status with water or electrolyte rich fluid. Doubt cardiac related - no cause found on telemetry. Will continue to monitor symptoms for now.

## 2018-01-24 NOTE — Patient Instructions (Signed)
I'm glad you're feeling better. Continue good hydration status - water and propel.  Return Wednesday morning first thing for labs. Schedule appointment up front.

## 2018-01-24 NOTE — Assessment & Plan Note (Signed)
Had just had another melanoma removed from R upper back 01/2018

## 2018-01-24 NOTE — Assessment & Plan Note (Addendum)
Thought peripelvic cyst. Consider renal US at f/u.

## 2018-01-26 ENCOUNTER — Other Ambulatory Visit (INDEPENDENT_AMBULATORY_CARE_PROVIDER_SITE_OTHER): Payer: Medicare Other

## 2018-01-26 DIAGNOSIS — I951 Orthostatic hypotension: Secondary | ICD-10-CM

## 2018-01-26 LAB — CBC WITH DIFFERENTIAL/PLATELET
Basophils Absolute: 0.1 10*3/uL (ref 0.0–0.1)
Basophils Relative: 0.9 % (ref 0.0–3.0)
Eosinophils Absolute: 0.2 10*3/uL (ref 0.0–0.7)
Eosinophils Relative: 2.7 % (ref 0.0–5.0)
HCT: 35.5 % — ABNORMAL LOW (ref 39.0–52.0)
Hemoglobin: 12.6 g/dL — ABNORMAL LOW (ref 13.0–17.0)
Lymphocytes Relative: 28.4 % (ref 12.0–46.0)
Lymphs Abs: 2.1 10*3/uL (ref 0.7–4.0)
MCHC: 35.6 g/dL (ref 30.0–36.0)
MCV: 91.6 fl (ref 78.0–100.0)
Monocytes Absolute: 0.8 10*3/uL (ref 0.1–1.0)
Monocytes Relative: 10.2 % (ref 3.0–12.0)
Neutro Abs: 4.3 10*3/uL (ref 1.4–7.7)
Neutrophils Relative %: 57.8 % (ref 43.0–77.0)
Platelets: 227 10*3/uL (ref 150.0–400.0)
RBC: 3.87 Mil/uL — ABNORMAL LOW (ref 4.22–5.81)
RDW: 13 % (ref 11.5–15.5)
WBC: 7.4 10*3/uL (ref 4.0–10.5)

## 2018-01-26 LAB — COMPREHENSIVE METABOLIC PANEL
ALT: 24 U/L (ref 0–53)
AST: 17 U/L (ref 0–37)
Albumin: 3.6 g/dL (ref 3.5–5.2)
Alkaline Phosphatase: 38 U/L — ABNORMAL LOW (ref 39–117)
BUN: 13 mg/dL (ref 6–23)
CO2: 30 mEq/L (ref 19–32)
Calcium: 9 mg/dL (ref 8.4–10.5)
Chloride: 107 mEq/L (ref 96–112)
Creatinine, Ser: 1.01 mg/dL (ref 0.40–1.50)
GFR: 75.57 mL/min (ref >60.00)
Glucose, Bld: 100 mg/dL — ABNORMAL HIGH (ref 70–99)
Potassium: 4 mEq/L (ref 3.5–5.1)
Sodium: 141 mEq/L (ref 135–145)
Total Bilirubin: 0.5 mg/dL (ref 0.2–1.2)
Total Protein: 6.1 g/dL (ref 6.0–8.3)

## 2018-01-28 LAB — CORTISOL-AM, BLOOD: Cortisol - AM: 16.2 ug/dL

## 2018-03-01 DIAGNOSIS — T798XXA Other early complications of trauma, initial encounter: Secondary | ICD-10-CM | POA: Diagnosis not present

## 2018-03-01 DIAGNOSIS — Z85820 Personal history of malignant melanoma of skin: Secondary | ICD-10-CM | POA: Diagnosis not present

## 2018-03-01 DIAGNOSIS — L98499 Non-pressure chronic ulcer of skin of other sites with unspecified severity: Secondary | ICD-10-CM | POA: Diagnosis not present

## 2018-03-14 DIAGNOSIS — Z85820 Personal history of malignant melanoma of skin: Secondary | ICD-10-CM | POA: Diagnosis not present

## 2018-03-14 DIAGNOSIS — L98499 Non-pressure chronic ulcer of skin of other sites with unspecified severity: Secondary | ICD-10-CM | POA: Diagnosis not present

## 2018-04-04 DIAGNOSIS — L570 Actinic keratosis: Secondary | ICD-10-CM | POA: Diagnosis not present

## 2018-04-04 DIAGNOSIS — L98499 Non-pressure chronic ulcer of skin of other sites with unspecified severity: Secondary | ICD-10-CM | POA: Diagnosis not present

## 2018-04-04 DIAGNOSIS — Z85820 Personal history of malignant melanoma of skin: Secondary | ICD-10-CM | POA: Diagnosis not present

## 2018-04-04 DIAGNOSIS — T798XXA Other early complications of trauma, initial encounter: Secondary | ICD-10-CM | POA: Diagnosis not present

## 2018-04-04 DIAGNOSIS — L578 Other skin changes due to chronic exposure to nonionizing radiation: Secondary | ICD-10-CM | POA: Diagnosis not present

## 2018-04-21 DIAGNOSIS — L570 Actinic keratosis: Secondary | ICD-10-CM | POA: Diagnosis not present

## 2018-07-04 DIAGNOSIS — L578 Other skin changes due to chronic exposure to nonionizing radiation: Secondary | ICD-10-CM | POA: Diagnosis not present

## 2018-07-04 DIAGNOSIS — C44722 Squamous cell carcinoma of skin of right lower limb, including hip: Secondary | ICD-10-CM | POA: Diagnosis not present

## 2018-07-04 DIAGNOSIS — C4492 Squamous cell carcinoma of skin, unspecified: Secondary | ICD-10-CM

## 2018-07-04 DIAGNOSIS — Z85828 Personal history of other malignant neoplasm of skin: Secondary | ICD-10-CM | POA: Diagnosis not present

## 2018-07-04 DIAGNOSIS — L821 Other seborrheic keratosis: Secondary | ICD-10-CM | POA: Diagnosis not present

## 2018-07-04 DIAGNOSIS — L720 Epidermal cyst: Secondary | ICD-10-CM | POA: Diagnosis not present

## 2018-07-04 DIAGNOSIS — Z85820 Personal history of malignant melanoma of skin: Secondary | ICD-10-CM | POA: Diagnosis not present

## 2018-07-04 DIAGNOSIS — L570 Actinic keratosis: Secondary | ICD-10-CM | POA: Diagnosis not present

## 2018-07-04 DIAGNOSIS — D485 Neoplasm of uncertain behavior of skin: Secondary | ICD-10-CM | POA: Diagnosis not present

## 2018-07-04 HISTORY — DX: Squamous cell carcinoma of skin, unspecified: C44.92

## 2018-07-29 ENCOUNTER — Other Ambulatory Visit: Payer: Self-pay

## 2018-07-29 ENCOUNTER — Ambulatory Visit (INDEPENDENT_AMBULATORY_CARE_PROVIDER_SITE_OTHER): Payer: Medicare Other | Admitting: Family Medicine

## 2018-07-29 ENCOUNTER — Encounter: Payer: Self-pay | Admitting: Family Medicine

## 2018-07-29 VITALS — BP 124/60 | HR 80 | Temp 98.6°F | Ht 70.0 in | Wt 195.4 lb

## 2018-07-29 DIAGNOSIS — H60392 Other infective otitis externa, left ear: Secondary | ICD-10-CM

## 2018-07-29 DIAGNOSIS — H6092 Unspecified otitis externa, left ear: Secondary | ICD-10-CM | POA: Insufficient documentation

## 2018-07-29 MED ORDER — DOXYCYCLINE HYCLATE 100 MG PO TABS: 100.0000 mg | ORAL_TABLET | Freq: Two times a day (BID) | ORAL | 0 refills | Status: DC

## 2018-07-29 NOTE — Assessment & Plan Note (Signed)
With pustule anterior superficial external ear canal. Treat with ibuprofen, warm compresses, add doxycycline antibiotic. Update if not improving with treatment.

## 2018-07-29 NOTE — Progress Notes (Signed)
This visit was conducted in person.  BP 124/60 (BP Location: Left Arm, Patient Position: Sitting, Cuff Size: Normal)   Pulse 80   Temp 98.6 F (37 C) (Oral)   Ht 5\' 10"  (1.778 m)   Wt 195 lb 6 oz (88.6 kg)   SpO2 96%   BMI 28.03 kg/m    CC: check L ear Subjective:    Patient ID: Samuel Matthews, male    DOB: 1940-12-09, 78 y.o.   MRN: [id number]  HPI: Samuel Matthews is a 78 y.o. male presenting on 07/29/2018 for Ear Problem (C/o a bump with redness on outside of left ear. Noticed about 3 days ago. Area is tender to touch and now has some stuffiness inside ear. Thinks it may be insect bite. Tried Benadryl. )   3d h/o red tender spot at L ear, wonders if bug or spider bite. Yesterday noted ear swelling and L facial pain.  Treating with heating pad, benadryl and advil 400mg .  Small cold sore treating with xylactin.   No fevers/chills, ST, oral lesions.  No tooth pain, headache.  No ear drainage, no hearing changes or tinnitus.      Relevant past medical, surgical, family and social history reviewed and updated as indicated. Interim medical history since our last visit reviewed. Allergies and medications reviewed and updated. No outpatient medications prior to visit.   No facility-administered medications prior to visit.      Per HPI unless specifically indicated in ROS section below Review of Systems Objective:    BP 124/60 (BP Location: Left Arm, Patient Position: Sitting, Cuff Size: Normal)   Pulse 80   Temp 98.6 F (37 C) (Oral)   Ht 5\' 10"  (1.778 m)   Wt 195 lb 6 oz (88.6 kg)   SpO2 96%   BMI 28.03 kg/m   Wt Readings from Last 3 Encounters:  07/29/18 195 lb 6 oz (88.6 kg)  01/24/18 198 lb 4 oz (89.9 kg)  01/18/18 195 lb 8 oz (88.7 kg)    Physical Exam Vitals signs and nursing note reviewed.  Constitutional:      General: He is not in acute distress.    Appearance: Normal appearance. He is well-developed. He is not ill-appearing.  HENT:     Head:  Normocephalic and atraumatic.     Right Ear: Hearing, tympanic membrane, ear canal and external ear normal.     Left Ear: Hearing and tympanic membrane normal.     Ears:      Comments: Anterior external ear tenderness/swelling Pustule present inner external ear anteriorly, unable to express drainage from lesion Cerumen in canal, but TM visualized and pearly grey without perforation. No significant edema or erythema distal external canal    Nose: Nose normal. No mucosal edema or congestion.     Right Sinus: No maxillary sinus tenderness or frontal sinus tenderness.     Left Sinus: No maxillary sinus tenderness or frontal sinus tenderness.     Mouth/Throat:     Mouth: Mucous membranes are moist.     Pharynx: Uvula midline. No oropharyngeal exudate or posterior oropharyngeal erythema.     Tonsils: No tonsillar abscesses.  Eyes:     General: No scleral icterus.    Conjunctiva/sclera: Conjunctivae normal.     Pupils: Pupils are equal, round, and reactive to light.  Neck:     Musculoskeletal: Normal range of motion and neck supple.  Lymphadenopathy:     Cervical: No cervical adenopathy.  Skin:  General: Skin is warm and dry.     Findings: No rash.  Neurological:     Mental Status: He is alert.        Assessment & Plan:   Problem List Items Addressed This Visit    External otitis of left ear - Primary    With pustule anterior superficial external ear canal. Treat with ibuprofen, warm compresses, add doxycycline antibiotic. Update if not improving with treatment.           Meds ordered this encounter  Medications  . doxycycline (VIBRA-TABS) 100 MG tablet    Sig: Take 1 tablet (100 mg total) by mouth 2 (two) times daily.    Dispense:  14 tablet    Refill:  0   No orders of the defined types were placed in this encounter.   Follow up plan: Return if symptoms worsen or fail to improve.  Ria Bush, MD

## 2018-07-29 NOTE — Patient Instructions (Addendum)
I think you have left external ear infection  Treat with doxycycline antibiotic sent to pharmacy.  Use warm compresses to left ear.  Let me know if not improving with treatment.

## 2018-08-24 DIAGNOSIS — H5203 Hypermetropia, bilateral: Secondary | ICD-10-CM | POA: Diagnosis not present

## 2018-08-24 DIAGNOSIS — Z961 Presence of intraocular lens: Secondary | ICD-10-CM | POA: Diagnosis not present

## 2018-12-19 ENCOUNTER — Ambulatory Visit: Payer: Medicare Other

## 2018-12-19 ENCOUNTER — Encounter: Payer: Medicare Other | Admitting: Family Medicine

## 2018-12-19 DIAGNOSIS — Z85828 Personal history of other malignant neoplasm of skin: Secondary | ICD-10-CM | POA: Diagnosis not present

## 2018-12-19 DIAGNOSIS — L814 Other melanin hyperpigmentation: Secondary | ICD-10-CM | POA: Diagnosis not present

## 2018-12-19 DIAGNOSIS — Z1283 Encounter for screening for malignant neoplasm of skin: Secondary | ICD-10-CM | POA: Diagnosis not present

## 2018-12-19 DIAGNOSIS — D1800 Hemangioma unspecified site: Secondary | ICD-10-CM | POA: Diagnosis not present

## 2018-12-19 DIAGNOSIS — L918 Other hypertrophic disorders of the skin: Secondary | ICD-10-CM | POA: Diagnosis not present

## 2018-12-19 DIAGNOSIS — Z85820 Personal history of malignant melanoma of skin: Secondary | ICD-10-CM | POA: Diagnosis not present

## 2018-12-19 DIAGNOSIS — D229 Melanocytic nevi, unspecified: Secondary | ICD-10-CM | POA: Diagnosis not present

## 2018-12-19 DIAGNOSIS — L578 Other skin changes due to chronic exposure to nonionizing radiation: Secondary | ICD-10-CM | POA: Diagnosis not present

## 2018-12-19 DIAGNOSIS — D225 Melanocytic nevi of trunk: Secondary | ICD-10-CM | POA: Diagnosis not present

## 2018-12-19 DIAGNOSIS — D2230 Melanocytic nevi of unspecified part of face: Secondary | ICD-10-CM | POA: Diagnosis not present

## 2018-12-19 DIAGNOSIS — L570 Actinic keratosis: Secondary | ICD-10-CM | POA: Diagnosis not present

## 2018-12-19 DIAGNOSIS — L820 Inflamed seborrheic keratosis: Secondary | ICD-10-CM | POA: Diagnosis not present

## 2019-01-25 ENCOUNTER — Other Ambulatory Visit: Payer: Self-pay

## 2019-01-25 ENCOUNTER — Encounter: Payer: Self-pay | Admitting: Family Medicine

## 2019-01-25 ENCOUNTER — Ambulatory Visit (INDEPENDENT_AMBULATORY_CARE_PROVIDER_SITE_OTHER): Payer: Medicare Other | Admitting: Family Medicine

## 2019-01-25 VITALS — BP 124/70 | HR 65 | Temp 97.7°F | Ht 70.0 in | Wt 190.2 lb

## 2019-01-25 DIAGNOSIS — Z86006 Personal history of melanoma in-situ: Secondary | ICD-10-CM

## 2019-01-25 DIAGNOSIS — E663 Overweight: Secondary | ICD-10-CM | POA: Diagnosis not present

## 2019-01-25 DIAGNOSIS — Z7189 Other specified counseling: Secondary | ICD-10-CM

## 2019-01-25 DIAGNOSIS — D649 Anemia, unspecified: Secondary | ICD-10-CM | POA: Diagnosis not present

## 2019-01-25 DIAGNOSIS — Z1211 Encounter for screening for malignant neoplasm of colon: Secondary | ICD-10-CM | POA: Diagnosis not present

## 2019-01-25 DIAGNOSIS — Z23 Encounter for immunization: Secondary | ICD-10-CM

## 2019-01-25 DIAGNOSIS — R3129 Other microscopic hematuria: Secondary | ICD-10-CM

## 2019-01-25 DIAGNOSIS — Z0000 Encounter for general adult medical examination without abnormal findings: Secondary | ICD-10-CM

## 2019-01-25 DIAGNOSIS — N2889 Other specified disorders of kidney and ureter: Secondary | ICD-10-CM

## 2019-01-25 LAB — POC URINALSYSI DIPSTICK (AUTOMATED)
Bilirubin, UA: NEGATIVE
Glucose, UA: NEGATIVE
Ketones, UA: NEGATIVE
Leukocytes, UA: NEGATIVE
Nitrite, UA: NEGATIVE
Protein, UA: NEGATIVE
Spec Grav, UA: 1.02 (ref 1.010–1.025)
Urobilinogen, UA: 0.2 E.U./dL
pH, UA: 6 (ref 5.0–8.0)

## 2019-01-25 MED ORDER — MULTIVITAMIN ADULT PO TABS: 1.0000 | ORAL_TABLET | Freq: Every day | ORAL | Status: DC

## 2019-01-25 NOTE — Assessment & Plan Note (Signed)
Advanced directive - received and scanned 07/2014. Samuel Matthews wife is HCPOA, does not want prolonged life support (07/2014). Ok for reversible conditions.

## 2019-01-25 NOTE — Progress Notes (Signed)
This visit was conducted in person.  BP 124/70 (BP Location: Left Arm, Patient Position: Sitting, Cuff Size: Normal)   Pulse 65   Temp 97.7 F (36.5 C) (Temporal)   Ht 5' 10" (1.778 m)   Wt 190 lb 4 oz (86.3 kg)   SpO2 97%   BMI 27.30 kg/m    CC: AMW Subjective:    Patient ID: Samuel Matthews, male    DOB: 12/24/1940, 78 y.o.   MRN: [id number]  HPI: Samuel Matthews is a 78 y.o. male presenting on 01/25/2019 for Medicare Wellness   Did not see health advisor this year.  Wife coming to see me tomorrow.    Hearing Screening   125Hz 250Hz 500Hz 1000Hz 2000Hz 3000Hz 4000Hz 6000Hz 8000Hz  Right ear:   40 40 20  0    Left ear:   40 20 20  0    Vision Screening Comments: Last eye exam, 08/2018.    Office Visit from 01/25/2019 in [place] at [place]  PHQ-2 Total Score  0      Fall Risk  01/25/2019 12/16/2017 12/10/2016 07/04/2015  Falls in the past year? 0 No No No     Preventative: COLONOSCOPY Date: 07/2003 diverticulosis, int hem ([facility]).cologuard normal 2017. Healthy 78 yo. Will do iFOB today.  Prostate - never had screening. Age out.  Lung cancer screening - never smoker  Flu shot yearly  prevnar 2017, pneumovax 2013 Td 2010  zostavax 2015 Shingrix - declines Advanced directive - received and scanned 07/2014. Peter Congo wife is HCPOA, does not want prolonged life support (07/2014). Ok for reversible conditions. Seat belt use discussed.  Sunscreen use discussed. No changing moles on skin. Sees derm Q74mo([facility]. H/o melanomax2on back. Non smoker Alcohol - very little Dentist Q4 mo  Eye exam yearly  Bowel - no constipation/diarrhea Bladder - no incontinence. Nocturia x1.   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 Interim medical history since our last visit reviewed. Allergies and medications reviewed and updated. Outpatient Medications Prior to Visit  Medication Sig Dispense Refill  . doxycycline (VIBRA-TABS) 100 MG tablet Take 1 tablet (100 mg total) by mouth 2 (two) times daily. 14 tablet 0   No facility-administered medications prior to visit.      Per HPI unless specifically indicated in ROS section below Review of Systems Objective:    BP 124/70 (BP Location: Left Arm, Patient Position: Sitting, Cuff Size: Normal)   Pulse 65   Temp 97.7 F (36.5 C) (Temporal)   Ht 5' 10" (1.778 m)   Wt 190 lb 4 oz (86.3 kg)   SpO2 97%   BMI 27.30 kg/m   Wt Readings from Last 3 Encounters:  01/25/19 190 lb 4 oz (86.3 kg)  07/29/18 195 lb 6 oz (88.6 kg)  01/24/18 198 lb 4 oz (89.9 kg)    Physical Exam Vitals signs and nursing note reviewed.  Constitutional:      General: He is not in acute distress.    Appearance: Normal appearance. He is well-developed. He is not ill-appearing.  HENT:     Head: Normocephalic and atraumatic.     Right  Ear: Hearing, tympanic membrane, ear canal and external ear normal.     Left Ear: Hearing, tympanic membrane, ear canal and external ear normal.     Nose: Nose normal.     Mouth/Throat:     Mouth: Mucous membranes are moist.     Pharynx: Oropharynx is clear. Uvula midline. No posterior oropharyngeal erythema.  Eyes:     General: No scleral icterus.    Conjunctiva/sclera: Conjunctivae normal.     Pupils: Pupils are equal, round, and reactive to light.  Neck:     Musculoskeletal: Normal range of motion and neck supple.     Vascular: No carotid bruit.  Cardiovascular:     Rate and Rhythm: Normal rate and regular rhythm.     Pulses: Normal pulses.          Radial pulses are 2+ on the right side and 2+ on the  left side.     Heart sounds: Normal heart sounds. No murmur.  Pulmonary:     Effort: Pulmonary effort is normal. No respiratory distress.     Breath sounds: Normal breath sounds. No wheezing, rhonchi or rales.  Abdominal:     General: Abdomen is flat. Bowel sounds are normal. There is no distension.     Palpations: Abdomen is soft. There is no mass.     Tenderness: There is no abdominal tenderness. There is no guarding or rebound.     Hernia: No hernia is present.  Musculoskeletal: Normal range of motion.  Lymphadenopathy:     Cervical: No cervical adenopathy.  Skin:    General: Skin is warm and dry.     Findings: No rash.  Neurological:     General: No focal deficit present.     Mental Status: He is alert and oriented to person, place, and time.     Comments:  CN grossly intact, station and gait intact Recall 3/3 Calculation 5/5 DLROW  Psychiatric:        Mood and Affect: Mood normal.        Behavior: Behavior normal.        Thought Content: Thought content normal.        Judgment: Judgment normal.       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 Update UA and imaging.       Relevant Orders   POCT Urinalysis Dipstick (Automated) (Completed)   CT HEMATURIA WORKUP   Overweight with body mass index (BMI) 25.0-29.9   Relevant Orders   Basic metabolic panel   Microhematuria    Pt states this is longstanding even since  he lived in [place], but has not seen urology in the past. Will check CT hematuria protocol and discussed possible uro referral for cystoscopy to complete hematuria workup.  Relevant Orders   CT HEMATURIA WORKUP   Medicare annual wellness visit, subsequent - Primary    I have personally reviewed the Medicare Annual Wellness questionnaire and have noted 1. The patient's medical and social history 2. Their use of alcohol, tobacco or illicit drugs 3. Their current medications and supplements 4. The patient's functional ability including ADL's, fall risks, home safety risks and hearing or visual impairment. Cognitive function has been assessed and addressed as indicated.  5. Diet and physical activity 6. Evidence for depression or mood disorders The patients weight, height, BMI have been recorded in the chart. I have made referrals, counseling and provided education to the patient based on review of the above and I have provided the pt with a written personalized care plan for preventive services. Provider list updated.. See scanned questionairre as needed for further documentation. Reviewed preventative protocols and updated unless pt declined.       History of melanoma in situ    Sees derm regularly      Anemia    Update CBC, check iron panel.       Relevant Orders   CBC with Differential   Ferritin   IBC panel   Advanced care planning/counseling discussion    Advanced directive - received and scanned 07/2014. Peter Congo wife is HCPOA, does not want prolonged life support (07/2014). Ok for reversible conditions.       Other Visit Diagnoses    Need for influenza vaccination       Relevant Orders   Flu Vaccine QUAD High Dose(Fluad) (Completed)   Special screening for malignant neoplasms, colon       Relevant Orders   Fecal occult blood, imunochemical       Meds ordered this encounter  Medications  . Multiple Vitamins-Minerals (MULTIVITAMIN ADULT) TABS    Sig: Take 1  tablet by mouth daily.   Orders Placed This Encounter  Procedures  . Fecal occult blood, imunochemical    Standing Status:   Future    Standing Expiration Date:   01/25/2020  . CT HEMATURIA WORKUP    Standing Status:   Future    Standing Expiration Date:   04/26/2020    Order Specific Question:   Reason for Exam (SYMPTOM  OR DIAGNOSIS REQUIRED)    Answer:   hematuria, R renal ?cyst    Order Specific Question:   Preferred imaging location?    Answer:   [id number]    Order Specific Question:   Radiology Contrast Protocol - do NOT remove file path    Answer:   _0 [email or web address]  . Flu Vaccine QUAD High Dose(Fluad)  . CBC with Differential  . Basic metabolic panel  . Ferritin  . IBC panel  . POCT Urinalysis Dipstick (Automated)    Patient instructions: Pass by lab to pick up stool kit.  Flu shot today If interested, check with pharmacy about new 2 shot shingles series (shingrix) (wait 2 weeks after today's flu shot).  You are doing well today Return as needed or in 1 year for next wellness visit.   Follow up plan: Return in about 1 year (around 01/25/2020) for medicare wellness visit.  Ria Bush, MD

## 2019-01-25 NOTE — Assessment & Plan Note (Addendum)

## 2019-01-25 NOTE — Assessment & Plan Note (Addendum)
Update CBC, check iron panel.

## 2019-01-25 NOTE — Assessment & Plan Note (Signed)
Pt states this is longstanding even since he lived in [place], but has not seen urology in the past. Will check CT hematuria protocol and discussed possible uro referral for cystoscopy to complete hematuria workup.

## 2019-01-25 NOTE — Assessment & Plan Note (Addendum)
2.7cm presumed peripelvic cyst on prior imaging 01/2018. Update UA and imaging.

## 2019-01-25 NOTE — Assessment & Plan Note (Signed)
Sees derm regularly.

## 2019-01-25 NOTE — Patient Instructions (Addendum)
Pass by lab to pick up stool kit.  Flu shot today If interested, check with pharmacy about new 2 shot shingles series (shingrix) (wait 2 weeks after today's flu shot).  You are doing well today Return as needed or in 1 year for next wellness visit.   Health Maintenance After Age 80 After age 70, you are at a higher risk for certain long-term diseases and infections as well as injuries from falls. Falls are a major cause of broken bones and head injuries in people who are older than age 11. Getting regular preventive care can help to keep you healthy and well. Preventive care includes getting regular testing and making lifestyle changes as recommended by your health care provider. Talk with your health care provider about:  Which screenings and tests you should have. A screening is a test that checks for a disease when you have no symptoms.  A diet and exercise plan that is right for you. What should I know about screenings and tests to prevent falls? Screening and testing are the best ways to find a health problem early. Early diagnosis and treatment give you the best chance of managing medical conditions that are common after age 65. Certain conditions and lifestyle choices may make you more likely to have a fall. Your health care provider may recommend:  Regular vision checks. Poor vision and conditions such as cataracts can make you more likely to have a fall. If you wear glasses, make sure to get your prescription updated if your vision changes.  Medicine review. Work with your health care provider to regularly review all of the medicines you are taking, including over-the-counter medicines. Ask your health care provider about any side effects that may make you more likely to have a fall. Tell your health care provider if any medicines that you take make you feel dizzy or sleepy.  Osteoporosis screening. Osteoporosis is a condition that causes the bones to get weaker. This can make the bones  weak and cause them to break more easily.  Blood pressure screening. Blood pressure changes and medicines to control blood pressure can make you feel dizzy.  Strength and balance checks. Your health care provider may recommend certain tests to check your strength and balance while standing, walking, or changing positions.  Foot health exam. Foot pain and numbness, as well as not wearing proper footwear, can make you more likely to have a fall.  Depression screening. You may be more likely to have a fall if you have a fear of falling, feel emotionally low, or feel unable to do activities that you used to do.  Alcohol use screening. Using too much alcohol can affect your balance and may make you more likely to have a fall. What actions can I take to lower my risk of falls? General instructions  Talk with your health care provider about your risks for falling. Tell your health care provider if: ? You fall. Be sure to tell your health care provider about all falls, even ones that seem minor. ? You feel dizzy, sleepy, or off-balance.  Take over-the-counter and prescription medicines only as told by your health care provider. These include any supplements.  Eat a healthy diet and maintain a healthy weight. A healthy diet includes low-fat dairy products, low-fat (lean) meats, and fiber from whole grains, beans, and lots of fruits and vegetables. Home safety  Remove any tripping hazards, such as rugs, cords, and clutter.  Install safety equipment such as grab bars in  bathrooms and safety rails on stairs.  Keep rooms and walkways well-lit. Activity   Follow a regular exercise program to stay fit. This will help you maintain your balance. Ask your health care provider what types of exercise are appropriate for you.  If you need a cane or walker, use it as recommended by your health care provider.  Wear supportive shoes that have nonskid soles. Lifestyle  Do not drink alcohol if your  health care provider tells you not to drink.  If you drink alcohol, limit how much you have: ? 0-1 drink a day for women. ? 0-2 drinks a day for men.  Be aware of how much alcohol is in your drink. In the [place] one drink equals one typical bottle of beer (12 oz), one-half glass of wine (5 oz), or one shot of hard liquor (1 oz).  Do not use any products that contain nicotine or tobacco, such as cigarettes and e-cigarettes. If you need help quitting, ask your health care provider. Summary  Having a healthy lifestyle and getting preventive care can help to protect your health and wellness after age 30.  Screening and testing are the best way to find a health problem early and help you avoid having a fall. Early diagnosis and treatment give you the best chance for managing medical conditions that are more common for people who are older than age 99.  Falls are a major cause of broken bones and head injuries in people who are older than age 57. Take precautions to prevent a fall at home.  Work with your health care provider to learn what changes you can make to improve your health and wellness and to prevent falls. This information is not intended to replace advice given to you by your health care provider. Make sure you discuss any questions you have with your health care provider. Document Released: 01/13/2017 Document Revised: 06/23/2018 Document Reviewed: 01/13/2017 Elsevier Patient Education  2020 [employer].

## 2019-01-26 ENCOUNTER — Other Ambulatory Visit (INDEPENDENT_AMBULATORY_CARE_PROVIDER_SITE_OTHER): Payer: Medicare Other

## 2019-01-26 DIAGNOSIS — Z1211 Encounter for screening for malignant neoplasm of colon: Secondary | ICD-10-CM

## 2019-01-26 LAB — IBC PANEL
Iron: 88 ug/dL (ref 42–165)
Saturation Ratios: 28.3 % (ref 20.0–50.0)
Transferrin: 222 mg/dL (ref 212.0–360.0)

## 2019-01-26 LAB — CBC WITH DIFFERENTIAL/PLATELET
Basophils Absolute: 0 10*3/uL (ref 0.0–0.1)
Basophils Relative: 0.6 % (ref 0.0–3.0)
Eosinophils Absolute: 0.4 10*3/uL (ref 0.0–0.7)
Eosinophils Relative: 7.3 % — ABNORMAL HIGH (ref 0.0–5.0)
HCT: 39.4 % (ref 39.0–52.0)
Hemoglobin: 13.6 g/dL (ref 13.0–17.0)
Lymphocytes Relative: 29.7 % (ref 12.0–46.0)
Lymphs Abs: 1.8 10*3/uL (ref 0.7–4.0)
MCHC: 34.5 g/dL (ref 30.0–36.0)
MCV: 93.8 fl (ref 78.0–100.0)
Monocytes Absolute: 0.6 10*3/uL (ref 0.1–1.0)
Monocytes Relative: 10.2 % (ref 3.0–12.0)
Neutro Abs: 3.2 10*3/uL (ref 1.4–7.7)
Neutrophils Relative %: 52.2 % (ref 43.0–77.0)
Platelets: 215 10*3/uL (ref 150.0–400.0)
RBC: 4.21 Mil/uL — ABNORMAL LOW (ref 4.22–5.81)
RDW: 13.1 % (ref 11.5–15.5)
WBC: 6.1 10*3/uL (ref 4.0–10.5)

## 2019-01-26 LAB — FERRITIN: Ferritin: 91.6 ng/mL (ref 22.0–322.0)

## 2019-01-26 LAB — BASIC METABOLIC PANEL
BUN: 15 mg/dL (ref 6–23)
CO2: 28 mEq/L (ref 19–32)
Calcium: 9.3 mg/dL (ref 8.4–10.5)
Chloride: 106 mEq/L (ref 96–112)
Creatinine, Ser: 1.08 mg/dL (ref 0.40–1.50)
GFR: 65.64 mL/min (ref >60.00)
Glucose, Bld: 98 mg/dL (ref 70–99)
Potassium: 4.1 mEq/L (ref 3.5–5.1)
Sodium: 140 mEq/L (ref 135–145)

## 2019-01-26 LAB — FECAL OCCULT BLOOD, IMMUNOCHEMICAL: Fecal Occult Bld: NEGATIVE

## 2019-01-26 LAB — FECAL OCCULT BLOOD, GUAIAC: Fecal Occult Blood: NEGATIVE

## 2019-01-27 ENCOUNTER — Encounter: Payer: Self-pay | Admitting: Family Medicine

## 2019-01-30 DIAGNOSIS — L570 Actinic keratosis: Secondary | ICD-10-CM | POA: Diagnosis not present

## 2019-01-30 DIAGNOSIS — L578 Other skin changes due to chronic exposure to nonionizing radiation: Secondary | ICD-10-CM | POA: Diagnosis not present

## 2019-01-30 DIAGNOSIS — Z85828 Personal history of other malignant neoplasm of skin: Secondary | ICD-10-CM | POA: Diagnosis not present

## 2019-01-30 DIAGNOSIS — Z85820 Personal history of malignant melanoma of skin: Secondary | ICD-10-CM | POA: Diagnosis not present

## 2019-01-30 DIAGNOSIS — Z872 Personal history of diseases of the skin and subcutaneous tissue: Secondary | ICD-10-CM | POA: Diagnosis not present

## 2019-01-31 ENCOUNTER — Encounter: Payer: Self-pay | Admitting: Family Medicine

## 2019-01-31 ENCOUNTER — Ambulatory Visit
Admission: RE | Admit: 2019-01-31 | Discharge: 2019-01-31 | Disposition: A | Discharge status: Home / Self Care | Payer: Medicare Other | Source: Ambulatory Visit | Attending: Family Medicine | Admitting: Family Medicine

## 2019-01-31 ENCOUNTER — Other Ambulatory Visit: Payer: Self-pay

## 2019-01-31 ENCOUNTER — Other Ambulatory Visit: Payer: Self-pay | Admitting: Family Medicine

## 2019-01-31 DIAGNOSIS — I700 Atherosclerosis of aorta: Secondary | ICD-10-CM | POA: Insufficient documentation

## 2019-01-31 DIAGNOSIS — N2889 Other specified disorders of kidney and ureter: Secondary | ICD-10-CM | POA: Diagnosis not present

## 2019-01-31 DIAGNOSIS — R3129 Other microscopic hematuria: Secondary | ICD-10-CM | POA: Diagnosis not present

## 2019-01-31 DIAGNOSIS — N281 Cyst of kidney, acquired: Secondary | ICD-10-CM | POA: Diagnosis not present

## 2019-01-31 MED ORDER — IOHEXOL 350 MG/ML SOLN: 125.0000 mL | Freq: Once | INTRAVENOUS | Status: DC | PRN

## 2019-01-31 MED ORDER — IOHEXOL 300 MG/ML  SOLN
125.0000 mL | Freq: Once | INTRAMUSCULAR | Status: AC | PRN
  Administered 2019-01-31: 125 mL via INTRAVENOUS

## 2019-02-08 ENCOUNTER — Other Ambulatory Visit: Payer: Self-pay

## 2019-02-27 ENCOUNTER — Telehealth: Payer: Self-pay | Admitting: Family Medicine

## 2019-02-27 NOTE — Telephone Encounter (Signed)
Received request for ppx abx prior to dental procedure due to hip replacement history.  Planning to have impression of crown done tomorrow.  Spoke with wife. Low risk procedure. Discussed I don't think he needs to have abx ppx at this time, nor for h/o hip replacement. I did ask them to touch base with ortho and let me know if they recommend anything different.

## 2019-04-03 DIAGNOSIS — Z23 Encounter for immunization: Secondary | ICD-10-CM | POA: Diagnosis not present

## 2019-04-26 DIAGNOSIS — Z23 Encounter for immunization: Secondary | ICD-10-CM | POA: Diagnosis not present

## 2019-07-31 ENCOUNTER — Other Ambulatory Visit: Payer: Self-pay

## 2019-07-31 ENCOUNTER — Encounter: Payer: Self-pay | Admitting: Dermatology

## 2019-07-31 ENCOUNTER — Ambulatory Visit (INDEPENDENT_AMBULATORY_CARE_PROVIDER_SITE_OTHER): Payer: Medicare Other | Admitting: Dermatology

## 2019-07-31 DIAGNOSIS — D485 Neoplasm of uncertain behavior of skin: Secondary | ICD-10-CM

## 2019-07-31 DIAGNOSIS — L578 Other skin changes due to chronic exposure to nonionizing radiation: Secondary | ICD-10-CM

## 2019-07-31 DIAGNOSIS — L821 Other seborrheic keratosis: Secondary | ICD-10-CM

## 2019-07-31 DIAGNOSIS — Z86006 Personal history of melanoma in-situ: Secondary | ICD-10-CM

## 2019-07-31 DIAGNOSIS — L814 Other melanin hyperpigmentation: Secondary | ICD-10-CM | POA: Diagnosis not present

## 2019-07-31 DIAGNOSIS — D1801 Hemangioma of skin and subcutaneous tissue: Secondary | ICD-10-CM

## 2019-07-31 DIAGNOSIS — Z85828 Personal history of other malignant neoplasm of skin: Secondary | ICD-10-CM | POA: Diagnosis not present

## 2019-07-31 DIAGNOSIS — D492 Neoplasm of unspecified behavior of bone, soft tissue, and skin: Secondary | ICD-10-CM

## 2019-07-31 DIAGNOSIS — Z1283 Encounter for screening for malignant neoplasm of skin: Secondary | ICD-10-CM | POA: Diagnosis not present

## 2019-07-31 DIAGNOSIS — D229 Melanocytic nevi, unspecified: Secondary | ICD-10-CM

## 2019-07-31 DIAGNOSIS — L820 Inflamed seborrheic keratosis: Secondary | ICD-10-CM | POA: Diagnosis not present

## 2019-07-31 NOTE — Progress Notes (Signed)
Follow-Up Visit   Subjective  Samuel Matthews is a 79 y.o. male who presents for the following: Annual Exam (TBSE, hx of Melanoma IS,BCCs, SCCs).  He hasn't noticed any new or changing spots.  He recently got a sunburn on his scalp when he forgot to wear his hat at his grandson's baseball game.   The following portions of the chart were reviewed this encounter and updated as appropriate:      Review of Systems:  No other skin or systemic complaints except as noted in HPI or Assessment and Plan.  Objective  Well appearing patient in no apparent distress; mood and affect are within normal limits.  A full examination was performed including scalp, head, eyes, ears, nose, lips, neck, chest, axillae, abdomen, back, buttocks, bilateral upper extremities, bilateral lower extremities, hands, feet, fingers, toes, fingernails, and toenails. All findings within normal limits unless otherwise noted below.  Objective  Right lateral calf: Well healed scar with no evidence of recurrence   Objective  multiple: Well healed scars with no evidence of recurrence.  Nose is clear  Objective  Spinal Upper Back, R posterior shoulder x 2: Well healed scars with no evidence of recurrence   R post shoulder with hypertrophic scar, clear  Objective  R mid flexor forearm: 4.61mm pink flesh translucent pap        Assessment & Plan    Lentigines - Scattered tan macules - Discussed due to sun exposure - Benign, observe - Call for any changes  Seborrheic Keratoses - Stuck-on, waxy, tan-brown papules and plaques  - Discussed benign etiology and prognosis. - Observe - Call for any changes  Melanocytic Nevi - Tan-brown and/or pink-flesh-colored symmetric macules and papules - Benign appearing on exam today - Observation - Call clinic for new or changing moles - Recommend daily use of broad spectrum spf 30+ sunscreen to sun-exposed areas.   Hemangiomas - Red papules - Discussed benign  nature - Observe - Call for any changes  Actinic Damage - diffuse scaly erythematous macules with underlying dyspigmentation - Recommend daily broad spectrum sunscreen SPF 30+ to sun-exposed areas, reapply every 2 hours as needed.  - Call for new or changing lesions.  Skin cancer screening performed today.  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 Observe for recurrence. Call clinic for new or changing lesions.  Recommend regular skin exams, daily broad-spectrum spf 30+ sunscreen use, and photoprotection.     History of basal cell carcinoma (BCC) multiple  Clear. Observe for recurrence. Call clinic for new or changing lesions.  Recommend regular skin exams, daily broad-spectrum spf 30+ sunscreen use, and photoprotection.     Hx of melanoma in situ Spinal Upper Back, R posterior shoulder x 2  Clear. Observe for recurrence. Call clinic for new or changing lesions.  Recommend regular skin exams, daily broad-spectrum spf 30+ sunscreen use, and photoprotection.     Neoplasm of skin R mid flexor forearm  Skin / nail biopsy Type of biopsy: tangential   Informed consent: discussed and consent obtained   Anesthesia: the lesion was anesthetized in a standard fashion   Anesthesia comment:  Area prepped with alcohol Anesthetic:  0.5% bupivicaine w/ epinephrine 1-100,000 local infiltration Instrument used: flexible razor blade   Hemostasis achieved with: pressure, aluminum chloride and electrodesiccation   Outcome: patient tolerated procedure well   Post-procedure details: wound care instructions given   Post-procedure details comment:  Ointment and small bandage applied  Specimen 1 -  Surgical pathology Differential Diagnosis: Neurofibroma r/o BCC Check Margins: No 4.53mm pink flesh translucent pap  Neurofibroma r/o BCC  Return in about 6 months (around 01/31/2020) for TBSE, h/o melanoma IS, BCC/SCC.   I, Othelia Pulling, RMA, am acting as scribe  for Brendolyn Patty, MD .  Documentation: I have reviewed the above documentation for accuracy and completeness, and I agree with the above.  Brendolyn Patty MD

## 2019-07-31 NOTE — Patient Instructions (Signed)

## 2019-08-03 ENCOUNTER — Telehealth: Payer: Self-pay

## 2019-08-03 NOTE — Telephone Encounter (Signed)
-----   Message from Brendolyn Patty, MD sent at 08/03/2019  7:59 AM EDT ----- Skin , right mid flexor forearm SEBORRHEIC KERATOSIS, IRRITATED  Benign age related growth, irritated

## 2019-08-03 NOTE — Telephone Encounter (Signed)
Advised pt of bx results/sh ?

## 2019-12-21 ENCOUNTER — Telehealth: Payer: Self-pay | Admitting: Family Medicine

## 2019-12-21 NOTE — Telephone Encounter (Signed)
Called and left vm for the patient to call back to reschedule the 02/22/20 appt. Physical appt.

## 2020-01-09 ENCOUNTER — Telehealth: Payer: Self-pay | Admitting: Family Medicine

## 2020-01-09 NOTE — Telephone Encounter (Signed)
LVM for pt to RTN my call to R/S appt with [facility] on 02/15/20

## 2020-02-06 ENCOUNTER — Other Ambulatory Visit: Payer: Self-pay

## 2020-02-06 ENCOUNTER — Ambulatory Visit (INDEPENDENT_AMBULATORY_CARE_PROVIDER_SITE_OTHER): Payer: Medicare Other | Admitting: Dermatology

## 2020-02-06 ENCOUNTER — Encounter: Payer: Self-pay | Admitting: Dermatology

## 2020-02-06 DIAGNOSIS — L821 Other seborrheic keratosis: Secondary | ICD-10-CM | POA: Diagnosis not present

## 2020-02-06 DIAGNOSIS — Z86006 Personal history of melanoma in-situ: Secondary | ICD-10-CM

## 2020-02-06 DIAGNOSIS — L570 Actinic keratosis: Secondary | ICD-10-CM

## 2020-02-06 DIAGNOSIS — C44519 Basal cell carcinoma of skin of other part of trunk: Secondary | ICD-10-CM

## 2020-02-06 DIAGNOSIS — C44619 Basal cell carcinoma of skin of left upper limb, including shoulder: Secondary | ICD-10-CM

## 2020-02-06 DIAGNOSIS — Z1283 Encounter for screening for malignant neoplasm of skin: Secondary | ICD-10-CM | POA: Diagnosis not present

## 2020-02-06 DIAGNOSIS — L578 Other skin changes due to chronic exposure to nonionizing radiation: Secondary | ICD-10-CM | POA: Diagnosis not present

## 2020-02-06 DIAGNOSIS — D1800 Hemangioma unspecified site: Secondary | ICD-10-CM

## 2020-02-06 DIAGNOSIS — Z85828 Personal history of other malignant neoplasm of skin: Secondary | ICD-10-CM | POA: Diagnosis not present

## 2020-02-06 DIAGNOSIS — L814 Other melanin hyperpigmentation: Secondary | ICD-10-CM

## 2020-02-06 DIAGNOSIS — D485 Neoplasm of uncertain behavior of skin: Secondary | ICD-10-CM | POA: Diagnosis not present

## 2020-02-06 DIAGNOSIS — D229 Melanocytic nevi, unspecified: Secondary | ICD-10-CM | POA: Diagnosis not present

## 2020-02-06 NOTE — Progress Notes (Signed)
Follow-Up Visit   Subjective  Samuel Matthews is a 79 y.o. male who presents for the following: TBSE (6 mo total body exam. Hx of melanoma IS, hx of BCC, hx of SCC).    The following portions of the chart were reviewed this encounter and updated as appropriate:     Review of Systems: No other skin or systemic complaints except as noted in HPI or Assessment and Plan.   Objective  Well appearing patient in no apparent distress; mood and affect are within normal limits.  A full examination was performed including scalp, head, eyes, ears, nose, lips, neck, chest, axillae, abdomen, back, buttocks, bilateral upper extremities, bilateral lower extremities, hands, feet, fingers, toes, fingernails, and toenails. All findings within normal limits unless otherwise noted below.  Objective  Left medial posterior shoulder: 4 mm pink brown papule ISK r/o BCC     Objective  Spinal upper back: Lentigo vs SK, r/o atypia 3 mm brown macule with irregular pigment     Objective  left crown x 1: Erythematous thin papules/macules with gritty scale.   Assessment & Plan  Neoplasm of uncertain behavior of skin (2) Left medial posterior shoulder  Skin / nail biopsy Type of biopsy: tangential   Informed consent: discussed and consent obtained   Patient was prepped and draped in usual sterile fashion: Area prepped with alcohol. Anesthesia: the lesion was anesthetized in a standard fashion   Anesthetic:  1% lidocaine w/ epinephrine 1-100,000 buffered w/ 8.4% NaHCO3 Instrument used: flexible razor blade   Hemostasis achieved with: pressure, aluminum chloride and electrodesiccation   Outcome: patient tolerated procedure well   Post-procedure details: wound care instructions given   Post-procedure details comment:  Ointment and small bandage applied  Destruction of lesion  Destruction method: electrodesiccation and curettage   Informed consent: discussed and consent obtained   Timeout:   patient name, date of birth, surgical site, and procedure verified Curettage performed in three different directions: Yes   Electrodesiccation performed over the curetted area: Yes   Lesion length (cm):  0.4 Lesion width (cm):  0.4 Margin per side (cm):  0.1 Final wound size (cm):  0.6 Hemostasis achieved with:  pressure, aluminum chloride and electrodesiccation Outcome: patient tolerated procedure well with no complications   Post-procedure details: wound care instructions given   Post-procedure details comment:  Ointment and small bandage applied  Specimen 1 - Surgical pathology Differential Diagnosis: ISK r/o BCC Check Margins: No 4 mm pink brown papule   Spinal upper back  Epidermal / dermal shaving  Lesion diameter (cm):  0.6 Informed consent: discussed and consent obtained   Timeout: patient name, date of birth, surgical site, and procedure verified   Anesthesia: the lesion was anesthetized in a standard fashion   Anesthesia comment:  Area prepped with alcohol Anesthetic:  1% lidocaine w/ epinephrine 1-100,000 buffered w/ 8.4% NaHCO3 Instrument used: flexible razor blade   Hemostasis achieved with: aluminum chloride and electrodesiccation   Outcome: patient tolerated procedure well   Post-procedure details: wound care instructions given   Additional details:  Mupirocin ointment and Bandaid applied    Specimen 2 - Surgical pathology Differential Diagnosis: Lentigo vs SK, r/o atypia Check Margins: No 3 mm brown macule with irregular pigment  AK (actinic keratosis) left crown x 1  Prior to procedure, discussed risks of blister formation, small wound, skin dyspigmentation, or rare scar following cryotherapy.    Destruction of lesion - left crown x 1  Destruction method: cryotherapy   Informed  consent: discussed and consent obtained   Lesion destroyed using liquid nitrogen: Yes   Region frozen until ice ball extended beyond lesion: Yes   Outcome: patient tolerated  procedure well with no complications   Post-procedure details: wound care instructions given    Lentigines - Scattered tan macules - Discussed due to sun exposure - Benign, observe - Call for any changes  Seborrheic Keratoses - Stuck-on, waxy, tan-brown papules and plaques  - Discussed benign etiology and prognosis. - Observe - Call for any changes  Melanocytic Nevi - Tan-brown and/or pink-flesh-colored symmetric macules and papules - Benign appearing on exam today - Observation - Call clinic for new or changing moles - Recommend daily use of broad spectrum spf 30+ sunscreen to sun-exposed areas.   Hemangiomas - Red papules - Discussed benign nature - Observe - Call for any changes  Actinic Damage - Chronic, secondary to cumulative UV/sun exposure - diffuse scaly erythematous macules with underlying dyspigmentation - Recommend daily broad spectrum sunscreen SPF 30+ to sun-exposed areas, reapply every 2 hours as needed.  - Call for new or changing lesions.  Skin cancer screening performed today.  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 Observe for recurrence. Call clinic for new or changing lesions.  Recommend regular skin exams, daily broad-spectrum spf 30+ sunscreen use, and photoprotection.     History of basal cell carcinoma (BCC) multiple  Clear. Observe for recurrence. Call clinic for new or changing lesions.  Recommend regular skin exams, daily broad-spectrum spf 30+ sunscreen use, and photoprotection.     Hx of melanoma in situ Spinal Upper Back, R posterior shoulder x 2  Clear. Observe for recurrence. Call clinic for new or changing lesions.  Recommend regular skin exams, daily broad-spectrum spf 30+ sunscreen use, and photoprotection.   Return in about 6 months (around 08/05/2020) for TBSE.   I, Harriett Sine, CMA, am acting as scribe for Brendolyn Patty, MD.  Documentation: I have reviewed the above documentation  for accuracy and completeness, and I agree with the above.  Brendolyn Patty MD

## 2020-02-06 NOTE — Patient Instructions (Signed)

## 2020-02-12 ENCOUNTER — Telehealth: Payer: Self-pay

## 2020-02-12 NOTE — Telephone Encounter (Signed)
-----   Message from Brendolyn Patty, MD sent at 02/12/2020  9:07 AM EST ----- 1. Skin , left medial posterior shoulder BASAL CELL CARCINOMA, NODULAR PATTERN 2. Skin , spinal upper back PIGMENTED SEBORRHEIC KERATOSIS  1. BCC skin cancer- already txd with EDC 2. SK- benign

## 2020-02-12 NOTE — Telephone Encounter (Signed)
Advised pt of bx results/sh ?

## 2020-02-15 ENCOUNTER — Other Ambulatory Visit: Payer: Self-pay | Admitting: Family Medicine

## 2020-02-15 ENCOUNTER — Other Ambulatory Visit: Payer: Medicare Other

## 2020-02-15 ENCOUNTER — Ambulatory Visit: Payer: Medicare Other

## 2020-02-15 DIAGNOSIS — I951 Orthostatic hypotension: Secondary | ICD-10-CM

## 2020-02-15 DIAGNOSIS — Z96642 Presence of left artificial hip joint: Secondary | ICD-10-CM | POA: Diagnosis not present

## 2020-02-15 DIAGNOSIS — M1611 Unilateral primary osteoarthritis, right hip: Secondary | ICD-10-CM | POA: Diagnosis not present

## 2020-02-15 DIAGNOSIS — D649 Anemia, unspecified: Secondary | ICD-10-CM

## 2020-02-22 ENCOUNTER — Encounter: Payer: Medicare Other | Admitting: Family Medicine

## 2020-02-22 ENCOUNTER — Telehealth: Payer: Self-pay | Admitting: Family Medicine

## 2020-02-22 NOTE — Telephone Encounter (Signed)
Received preop eval request from Dr Wynelle Link for upcoming R hip replacement planned 04/2020.  Please call to schedule preop visit in open 30 min slot.

## 2020-02-23 ENCOUNTER — Ambulatory Visit: Payer: Medicare Other | Admitting: Family Medicine

## 2020-02-27 DIAGNOSIS — Z23 Encounter for immunization: Secondary | ICD-10-CM | POA: Diagnosis not present

## 2020-03-05 ENCOUNTER — Other Ambulatory Visit: Payer: Self-pay

## 2020-03-05 ENCOUNTER — Ambulatory Visit (INDEPENDENT_AMBULATORY_CARE_PROVIDER_SITE_OTHER): Payer: Medicare Other

## 2020-03-05 DIAGNOSIS — Z0000 Encounter for general adult medical examination without abnormal findings: Secondary | ICD-10-CM | POA: Diagnosis not present

## 2020-03-05 NOTE — Progress Notes (Signed)
PCP notes:  Health Maintenance: Flu- declined Tdap- insurance    Abnormal Screenings: none   Patient concerns: none   Nurse concerns: none   Next PCP appt.: 03/19/2020 @ 9:30 am

## 2020-03-05 NOTE — Progress Notes (Signed)
Subjective:   Samuel Matthews is a 79 y.o. male who presents for Medicare Annual/Subsequent preventive examination.  Review of Systems: N/A      I connected with the patient today by telephone and verified that I am speaking with the correct person using two identifiers. Location patient: home Location nurse: work Persons participating in the telephone visit: patient, nurse.   I discussed the limitations, risks, security and privacy concerns of performing an evaluation and management service by telephone and the availability of in person appointments. I also discussed with the patient that there may be a patient responsible charge related to this service. The patient expressed understanding and verbally consented to this telephonic visit.        Cardiac Risk Factors include: advanced age (>46men, >42 women);male gender     Objective:    Today's Vitals   There is no height or weight on file to calculate BMI.  Advanced Directives 03/05/2020 01/18/2018 01/18/2018 12/16/2017 12/10/2016 07/04/2015 01/25/2013  Does Patient Have a Medical Advance Directive? Yes No Yes Yes Yes Yes Patient has advance directive, copy not in chart  Type of Advance Directive [place];Living will Living will;Healthcare Power of [place];Living will [place];Living will [place];Living will [place];Living will [place];Living will  Does patient want to make changes to medical advance directive? - - - - - No - Patient declined -  Copy of [place] in Chart? No - copy requested No - copy requested - No - copy requested Yes No - copy requested Copy requested from family  Would patient like information on creating a medical advance directive? - No - Patient declined - - - - -    Current Medications (verified) Outpatient Encounter Medications as of 03/05/2020  Medication  Sig  . Multiple Vitamins-Minerals (MULTIVITAMIN ADULT) TABS Take 1 tablet by mouth daily.   No facility-administered encounter medications on file as of 03/05/2020.    Allergies (verified) Lidocaine and Sulfa drugs cross reactors   History: Past Medical History:  Diagnosis Date  . Actinic keratosis   . Basal cell carcinoma 12/07/2016   L nasal dorsum inferior  . Basal cell carcinoma 11/25/2015   L postauricular area  . Basal cell carcinoma 05/06/2015   R chest, L neck  . Basal cell carcinoma 10/30/2014   L ant shoulder  . Basal cell carcinoma 02/06/2020   L med posterior shoulder, EDC  . Basal cell carcinoma of nose 2015   ([name])  . Cancer (HCC)    Hx: of squamous cell left calf  . Diverticulosis 2005   by colonoscopy  . History of chicken pox   . 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 History of pneumothorax 1982   rib fracture, from a fall  . 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 Lower back pain 2012   thought HNP, improved with PT  . Osteoarthritis of left hip   . Pneumothorax, acute    Hx: of  . Squamous cell carcinoma of skin 07/04/2018   right lateral calf   Past Surgical History:  Procedure Laterality Date  . CATARACT EXTRACTION Bilateral 10/2014  . cataracts    .  CHEST TUBE INSERTION  1982  . COLONOSCOPY  07/2003   diverticulosis, int hem ([facility])  . [place]  . LASIK    . SKIN CANCER EXCISION  1993, 2014   Left calf  . TOTAL HIP ARTHROPLASTY Left 02/03/2013   [name], MD  . TRIGGER FINGER RELEASE  2001   Family History  Problem Relation Age of Onset  . Coronary artery disease Mother 44  . Hypertension Father   . CVA Father   . Healthy Sister   . Healthy Brother   . Diabetes Neg Hx   . Cancer Neg Hx    Social  History   Socioeconomic History  . Marital status: Married    Spouse name: Not on file  . Number of children: Not on file  . Years of education: Not on file  . Highest education level: Not on file  Occupational History  . Occupation: Retired  Tobacco Use  . Smoking status: Never Smoker  . Smokeless tobacco: Never Used  Vaping Use  . Vaping Use: Never used  Substance and Sexual Activity  . Alcohol use: Yes    Comment: Occasional  . Drug use: No  . Sexual activity: Never  Other Topics Concern  . 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, gym twice weekly.    Diet: fruits/vegetables daily, 2x/wk red meat, fish 1x/wk   Social Determinants of Health   Financial Resource Strain: Low Risk   . Difficulty of Paying Living Expenses: Not hard at all  Food Insecurity: No Food Insecurity  . Worried About Charity fundraiser in the Last Year: Never true  . Ran Out of Food in the Last Year: Never true  Transportation Needs: No Transportation Needs  . Lack of Transportation (Medical): No  . Lack of Transportation (Non-Medical): No  Physical Activity: Sufficiently Active  . Days of Exercise per Week: 2 days  . Minutes of Exercise per Session: 120 min  Stress: No Stress Concern Present  . Feeling of Stress : Not at all  Social Connections: Not on file    Tobacco Counseling Counseling given: Not Answered   Clinical Intake:  Pre-visit preparation completed: Yes  Pain : 0-10 Pain Type: Chronic pain Pain Location: Hip Pain Orientation: Right Pain Descriptors / Indicators: Aching Pain Onset: More than a month ago Pain Frequency: Intermittent     Nutritional Risks: None Diabetes: No  How often do you need to have someone help you when you read  instructions, pamphlets, or other written materials from your doctor or pharmacy?: 1 - Never What is the last grade level you completed in school?: business degree  Diabetic: No Nutrition Risk Assessment:  Has the patient had any N/V/D within the last 2 months?  No  Does the patient have any non-healing wounds?  No  Has the patient had any unintentional weight loss or weight gain?  No   Diabetes:  Is the patient diabetic?  No  If diabetic, was a CBG obtained today?  N/A Did the patient bring in their glucometer from home?  N/A How often do you monitor your CBG's? N/A.   Financial Strains and Diabetes Management:  Are you having any financial strains with the  device, your supplies or your medication? N/A.  Does the patient want to be seen by Chronic Care Management for management of their diabetes?  N/A Would the patient like to be referred to a Nutritionist or for Diabetic Management?  N/A   Interpreter Needed?: No  Information entered by :: CJohnson, LPN   Activities of Daily Living In your present state of health, do you have any difficulty performing the following activities: 03/05/2020  Hearing? N  Vision? N  Difficulty concentrating or making decisions? N  Walking or climbing stairs? N  Dressing or bathing? N  Doing errands, shopping? N  Preparing Food and eating ? N  Using the Toilet? N  In the past six months, have you accidently leaked urine? N  Do you have problems with loss of bowel control? N  Managing your Medications? N  Managing your Finances? N  Housekeeping or managing your Housekeeping? N  Some recent data might be hidden    Patient Care Team: Ria Bush, MD as PCP - General (Family Medicine)  Indicate any recent Medical Services you may have received from other than [facility] providers in the past year (date may be approximate).     Assessment:   This is a routine wellness examination for [place].  Hearing/Vision screen  Hearing Screening    125Hz  250Hz  500Hz  1000Hz  2000Hz  3000Hz  4000Hz  6000Hz  8000Hz   Right ear:           Left ear:           Vision Screening Comments: Patient gets annual eye exams   Dietary issues and exercise activities discussed: Current Exercise Habits: Home exercise routine, Type of exercise: walking, Time (Minutes): > 60, Frequency (Times/Week): 2, Weekly Exercise (Minutes/Week): 0, Intensity: Moderate, Exercise limited by: None identified  Goals    . Increase physical activity     Starting 12/16/2017, I will continue to play golf 4-5 hours 3 days per week.     . Patient Stated     03/05/2020, I will continue to play golf 2 days a week and walk for about 10,000-11,000 steps.      Depression Screen PHQ 2/9 Scores 03/05/2020 01/25/2019 12/16/2017 12/10/2016 07/04/2015 04/15/2011  PHQ - 2 Score 0 0 0 0 0 0  PHQ- 9 Score 0 - 0 0 - -    Fall Risk Fall Risk  03/05/2020 02/08/2019 01/25/2019 12/16/2017 12/10/2016  Falls in the past year? 0 0 0 No No  Comment - Emmi Telephone Survey: data to providers prior to load - - -  Number falls in past yr: 0 - - - -  Injury with Fall? 0 - - - -  Risk for fall due to : No Fall Risks - - - -  Follow up Falls evaluation completed;Falls prevention discussed - - - -    FALL RISK PREVENTION PERTAINING TO THE HOME:  Any stairs in or around the home? Yes  If so, are there any without handrails? No  Home free of loose throw rugs in walkways, pet beds, electrical cords, etc? Yes  Adequate lighting in your home to reduce risk of falls? Yes   ASSISTIVE DEVICES UTILIZED TO PREVENT FALLS:  Life alert? No  Use of a cane, walker or w/c? No  Grab bars in the bathroom? No  Shower chair or bench in shower? No  Elevated toilet seat or a handicapped toilet? No   TIMED UP AND GO:  Was the test performed? N/A, telephone visit.    Cognitive Function:  MMSE - Mini Mental State Exam 03/05/2020 12/16/2017 12/10/2016 07/04/2015  Orientation to time 5 5 5 5   Orientation to Place 5 5 5  5   Registration 3 3 3 3   Attention/ Calculation 5 0 0 0  Recall 3 3 3 3   Language- name 2 objects - 0 0 0  Language- repeat 1 1 1 1   Language- follow 3 step command - 3 3 3   Language- read & follow direction - 0 0 0  Write a sentence - 0 0 0  Copy design - 0 0 0  Total score - 20 20 20   Mini Cog  Mini-Cog screen was completed. Maximum score is 22. A value of 0 denotes this part of the MMSE was not completed or the patient failed this part of the Mini-Cog screening.       Immunizations Immunization History  Administered Date(s) Administered  . Fluad Quad(high Dose 65+) 01/25/2019  . Influenza,inj,Quad PF,6+ Mos 12/13/2012  . PFIZER SARS-COV-2 Vaccination 04/03/2019, 04/26/2019, 02/20/2020  . Pneumococcal Conjugate-13 07/04/2015  . Pneumococcal Polysaccharide-23 10/04/2007, 04/28/2011  . Td 03/16/2008  . Zoster 09/08/2013  . Zoster Recombinat (Shingrix) 10/15/2019, 12/15/2019    TDAP status: Due, Education has been provided regarding the importance of this vaccine. Advised may receive this vaccine at local pharmacy or Health Dept. Aware to provide a copy of the vaccination record if obtained from local pharmacy or Health Dept. Verbalized acceptance and understanding.  Flu Vaccine status: Declined, Education has been provided regarding the importance of this vaccine but patient still declined. Advised may receive this vaccine at local pharmacy or Health Dept. Aware to provide a copy of the vaccination record if obtained from local pharmacy or Health Dept. Verbalized acceptance and understanding.  Pneumococcal vaccine status: Up to date  Covid-19 vaccine status: Completed vaccines  Qualifies for Shingles Vaccine? Yes   Zostavax completed Yes   Shingrix Completed?: Yes  Screening Tests Health Maintenance  Topic Date Due  . INFLUENZA VACCINE  06/13/2020 (Originally 10/15/2019)  . TETANUS/TDAP  03/05/2024 (Originally 03/16/2018)  . COVID-19 Vaccine (4 - Booster for Pfizer series)  08/20/2020  . PNA vac Low Risk Adult  Completed    Health Maintenance  There are no preventive care reminders to display for this patient.  Colorectal cancer screening: No longer required.   Lung Cancer Screening: (Low Dose CT Chest recommended if Age 23-80 years, 30 pack-year currently smoking OR have quit w/in 15years.) does not qualify.    Additional Screening:  Hepatitis C Screening: does not qualify; Completed N/A  Vision Screening: Recommended annual ophthalmology exams for early detection of glaucoma and other disorders of the eye. Is the patient up to date with their annual eye exam?  Yes  Who is the provider or what is the name of the office in which the patient attends annual eye exams? Dr. Geryl Councilman, [facility]  If pt is not established with a provider, would they like to be referred to a provider to establish care? No .   Dental Screening: Recommended annual dental exams for proper oral hygiene  Community Resource Referral / Chronic Care Management: CRR required this visit?  No   CCM required this visit?  No      Plan:     I have personally reviewed and noted the following in the patient's chart:   . Medical and social history . Use of alcohol, tobacco or illicit drugs  . Current medications and supplements . Functional ability and status . Nutritional  status . Physical activity . Advanced directives . List of other physicians . Hospitalizations, surgeries, and ER visits in previous 12 months . Vitals . Screenings to include cognitive, depression, and falls . Referrals and appointments  In addition, I have reviewed and discussed with patient certain preventive protocols, quality metrics, and best practice recommendations. A written personalized care plan for preventive services as well as general preventive health recommendations were provided to patient.    Due to this being a telephonic visit, the after visit summary with patients personalized  plan was offered to patient via office or my-chart. Patient preferred to pick up at office at next visit or via mychart.    Andrez Grime, LPN   [id number]

## 2020-03-05 NOTE — Patient Instructions (Signed)
Samuel Matthews , Thank you for taking time to come for your Medicare Wellness Visit. I appreciate your ongoing commitment to your health goals. Please review the following plan we discussed and let me know if I can assist you in the future.   Screening recommendations/referrals: Colonoscopy: no longer required  Recommended yearly ophthalmology/optometry visit for glaucoma screening and checkup Recommended yearly dental visit for hygiene and checkup  Vaccinations: Influenza vaccine: declined Pneumococcal vaccine: Completed series Tdap vaccine: decline-insurance Shingles vaccine: Completed series  Covid-19: Completed series  Advanced directives: Please bring a copy of your POA (Power of Attorney) and/or Living Will to your next appointment.   Conditions/risks identified: none  Next appointment: Follow up in one year for your annual wellness visit.   Preventive Care 16 Years and Older, Male Preventive care refers to lifestyle choices and visits with your health care provider that can promote health and wellness. What does preventive care include?  A yearly physical exam. This is also called an annual well check.  Dental exams once or twice a year.  Routine eye exams. Ask your health care provider how often you should have your eyes checked.  Personal lifestyle choices, including:  Daily care of your teeth and gums.  Regular physical activity.  Eating a healthy diet.  Avoiding tobacco and drug use.  Limiting alcohol use.  Practicing safe sex.  Taking low doses of aspirin every day.  Taking vitamin and mineral supplements as recommended by your health care provider. What happens during an annual well check? The services and screenings done by your health care provider during your annual well check will depend on your age, overall health, lifestyle risk factors, and family history of disease. Counseling  Your health care provider may ask you questions about your:  Alcohol  use.  Tobacco use.  Drug use.  Emotional well-being.  Home and relationship well-being.  Sexual activity.  Eating habits.  History of falls.  Memory and ability to understand (cognition).  Work and work Statistician. Screening  You may have the following tests or measurements:  Height, weight, and BMI.  Blood pressure.  Lipid and cholesterol levels. These may be checked every 5 years, or more frequently if you are over 47 years old.  Skin check.  Lung cancer screening. You may have this screening every year starting at age 86 if you have a 30-pack-year history of smoking and currently smoke or have quit within the past 15 years.  Fecal occult blood test (FOBT) of the stool. You may have this test every year starting at age 68.  Flexible sigmoidoscopy or colonoscopy. You may have a sigmoidoscopy every 5 years or a colonoscopy every 10 years starting at age 64.  Prostate cancer screening. Recommendations will vary depending on your family history and other risks.  Hepatitis C blood test.  Hepatitis B blood test.  Sexually transmitted disease (STD) testing.  Diabetes screening. This is done by checking your blood sugar (glucose) after you have not eaten for a while (fasting). You may have this done every 1-3 years.  Abdominal aortic aneurysm (AAA) screening. You may need this if you are a current or former smoker.  Osteoporosis. You may be screened starting at age 14 if you are at high risk. Talk with your health care provider about your test results, treatment options, and if necessary, the need for more tests. Vaccines  Your health care provider may recommend certain vaccines, such as:  Influenza vaccine. This is recommended every year.  Tetanus,  diphtheria, and acellular pertussis (Tdap, Td) vaccine. You may need a Td booster every 10 years.  Zoster vaccine. You may need this after age 32.  Pneumococcal 13-valent conjugate (PCV13) vaccine. One dose is  recommended after age 83.  Pneumococcal polysaccharide (PPSV23) vaccine. One dose is recommended after age 62. Talk to your health care provider about which screenings and vaccines you need and how often you need them. This information is not intended to replace advice given to you by your health care provider. Make sure you discuss any questions you have with your health care provider. Document Released: 03/29/2015 Document Revised: 11/20/2015 Document Reviewed: 01/01/2015 Elsevier Interactive Patient Education  2017 [place] Prevention in the Home Falls can cause injuries. They can happen to people of all ages. There are many things you can do to make your home safe and to help prevent falls. What can I do on the outside of my home?  Regularly fix the edges of walkways and driveways and fix any cracks.  Remove anything that might make you trip as you walk through a door, such as a raised step or threshold.  Trim any bushes or trees on the path to your home.  Use bright outdoor lighting.  Clear any walking paths of anything that might make someone trip, such as rocks or tools.  Regularly check to see if handrails are loose or broken. Make sure that both sides of any steps have handrails.  Any raised decks and porches should have guardrails on the edges.  Have any leaves, snow, or ice cleared regularly.  Use sand or salt on walking paths during winter.  Clean up any spills in your garage right away. This includes oil or grease spills. What can I do in the bathroom?  Use night lights.  Install grab bars by the toilet and in the tub and shower. Do not use towel bars as grab bars.  Use non-skid mats or decals in the tub or shower.  If you need to sit down in the shower, use a plastic, non-slip stool.  Keep the floor dry. Clean up any water that spills on the floor as soon as it happens.  Remove soap buildup in the tub or shower regularly.  Attach bath mats  securely with double-sided non-slip rug tape.  Do not have throw rugs and other things on the floor that can make you trip. What can I do in the bedroom?  Use night lights.  Make sure that you have a light by your bed that is easy to reach.  Do not use any sheets or blankets that are too big for your bed. They should not hang down onto the floor.  Have a firm chair that has side arms. You can use this for support while you get dressed.  Do not have throw rugs and other things on the floor that can make you trip. What can I do in the kitchen?  Clean up any spills right away.  Avoid walking on wet floors.  Keep items that you use a lot in easy-to-reach places.  If you need to reach something above you, use a strong step stool that has a grab bar.  Keep electrical cords out of the way.  Do not use floor polish or wax that makes floors slippery. If you must use wax, use non-skid floor wax.  Do not have throw rugs and other things on the floor that can make you trip. What can I do with  my stairs?  Do not leave any items on the stairs.  Make sure that there are handrails on both sides of the stairs and use them. Fix handrails that are broken or loose. Make sure that handrails are as long as the stairways.  Check any carpeting to make sure that it is firmly attached to the stairs. Fix any carpet that is loose or worn.  Avoid having throw rugs at the top or bottom of the stairs. If you do have throw rugs, attach them to the floor with carpet tape.  Make sure that you have a light switch at the top of the stairs and the bottom of the stairs. If you do not have them, ask someone to add them for you. What else can I do to help prevent falls?  Wear shoes that:  Do not have high heels.  Have rubber bottoms.  Are comfortable and fit you well.  Are closed at the toe. Do not wear sandals.  If you use a stepladder:  Make sure that it is fully opened. Do not climb a closed  stepladder.  Make sure that both sides of the stepladder are locked into place.  Ask someone to hold it for you, if possible.  Clearly mark and make sure that you can see:  Any grab bars or handrails.  First and last steps.  Where the edge of each step is.  Use tools that help you move around (mobility aids) if they are needed. These include:  Canes.  Walkers.  Scooters.  Crutches.  Turn on the lights when you go into a dark area. Replace any light bulbs as soon as they burn out.  Set up your furniture so you have a clear path. Avoid moving your furniture around.  If any of your floors are uneven, fix them.  If there are any pets around you, be aware of where they are.  Review your medicines with your doctor. Some medicines can make you feel dizzy. This can increase your chance of falling. Ask your doctor what other things that you can do to help prevent falls. This information is not intended to replace advice given to you by your health care provider. Make sure you discuss any questions you have with your health care provider. Document Released: 12/27/2008 Document Revised: 08/08/2015 Document Reviewed: 04/06/2014 Elsevier Interactive Patient Education  2017 [employer].

## 2020-03-10 ENCOUNTER — Other Ambulatory Visit: Payer: Self-pay | Admitting: Family Medicine

## 2020-03-10 DIAGNOSIS — Z131 Encounter for screening for diabetes mellitus: Secondary | ICD-10-CM

## 2020-03-10 DIAGNOSIS — Z13220 Encounter for screening for lipoid disorders: Secondary | ICD-10-CM

## 2020-03-10 DIAGNOSIS — D649 Anemia, unspecified: Secondary | ICD-10-CM

## 2020-03-12 ENCOUNTER — Other Ambulatory Visit: Payer: Self-pay

## 2020-03-12 ENCOUNTER — Other Ambulatory Visit (INDEPENDENT_AMBULATORY_CARE_PROVIDER_SITE_OTHER): Payer: Medicare Other

## 2020-03-12 DIAGNOSIS — Z13220 Encounter for screening for lipoid disorders: Secondary | ICD-10-CM

## 2020-03-12 DIAGNOSIS — Z131 Encounter for screening for diabetes mellitus: Secondary | ICD-10-CM

## 2020-03-12 DIAGNOSIS — D649 Anemia, unspecified: Secondary | ICD-10-CM | POA: Diagnosis not present

## 2020-03-12 LAB — COMPREHENSIVE METABOLIC PANEL
ALT: 14 U/L (ref 0–53)
AST: 18 U/L (ref 0–37)
Albumin: 4 g/dL (ref 3.5–5.2)
Alkaline Phosphatase: 29 U/L — ABNORMAL LOW (ref 39–117)
BUN: 21 mg/dL (ref 6–23)
CO2: 27 mEq/L (ref 19–32)
Calcium: 9 mg/dL (ref 8.4–10.5)
Chloride: 108 mEq/L (ref 96–112)
Creatinine, Ser: 1.11 mg/dL (ref 0.40–1.50)
GFR: 62.07 mL/min (ref >60.00)
Glucose, Bld: 101 mg/dL — ABNORMAL HIGH (ref 70–99)
Potassium: 4.1 mEq/L (ref 3.5–5.1)
Sodium: 139 mEq/L (ref 135–145)
Total Bilirubin: 0.6 mg/dL (ref 0.2–1.2)
Total Protein: 6.5 g/dL (ref 6.0–8.3)

## 2020-03-12 LAB — CBC WITH DIFFERENTIAL/PLATELET
Basophils Absolute: 0.1 10*3/uL (ref 0.0–0.1)
Basophils Relative: 1 % (ref 0.0–3.0)
Eosinophils Absolute: 0.3 10*3/uL (ref 0.0–0.7)
Eosinophils Relative: 4.7 % (ref 0.0–5.0)
HCT: 39.1 % (ref 39.0–52.0)
Hemoglobin: 13.4 g/dL (ref 13.0–17.0)
Lymphocytes Relative: 29.3 % (ref 12.0–46.0)
Lymphs Abs: 1.8 10*3/uL (ref 0.7–4.0)
MCHC: 34.4 g/dL (ref 30.0–36.0)
MCV: 92.1 fl (ref 78.0–100.0)
Monocytes Absolute: 0.6 10*3/uL (ref 0.1–1.0)
Monocytes Relative: 10.3 % (ref 3.0–12.0)
Neutro Abs: 3.4 10*3/uL (ref 1.4–7.7)
Neutrophils Relative %: 54.7 % (ref 43.0–77.0)
Platelets: 226 10*3/uL (ref 150.0–400.0)
RBC: 4.24 Mil/uL (ref 4.22–5.81)
RDW: 13.1 % (ref 11.5–15.5)
WBC: 6.2 10*3/uL (ref 4.0–10.5)

## 2020-03-12 LAB — LIPID PANEL
Cholesterol: 158 mg/dL (ref 0–200)
HDL: 51.7 mg/dL (ref >39.00)
LDL Cholesterol: 99 mg/dL (ref 0–99)
NonHDL: 106.47
Total CHOL/HDL Ratio: 3
Triglycerides: 37 mg/dL (ref 0.0–149.0)
VLDL: 7.4 mg/dL (ref 0.0–40.0)

## 2020-03-19 ENCOUNTER — Encounter: Payer: Self-pay | Admitting: Family Medicine

## 2020-03-19 ENCOUNTER — Ambulatory Visit (INDEPENDENT_AMBULATORY_CARE_PROVIDER_SITE_OTHER): Payer: Medicare Other | Admitting: Family Medicine

## 2020-03-19 ENCOUNTER — Ambulatory Visit (INDEPENDENT_AMBULATORY_CARE_PROVIDER_SITE_OTHER)
Admission: RE | Admit: 2020-03-19 | Discharge: 2020-03-19 | Disposition: A | Discharge status: Home / Self Care | Payer: Medicare Other | Source: Ambulatory Visit | Attending: Family Medicine | Admitting: Family Medicine

## 2020-03-19 VITALS — BP 130/68 | HR 72 | Temp 97.2°F | Ht 69.49 in | Wt 194.2 lb

## 2020-03-19 DIAGNOSIS — E663 Overweight: Secondary | ICD-10-CM

## 2020-03-19 DIAGNOSIS — Z01818 Encounter for other preprocedural examination: Secondary | ICD-10-CM

## 2020-03-19 DIAGNOSIS — R739 Hyperglycemia, unspecified: Secondary | ICD-10-CM

## 2020-03-19 DIAGNOSIS — I700 Atherosclerosis of aorta: Secondary | ICD-10-CM | POA: Diagnosis not present

## 2020-03-19 DIAGNOSIS — R3129 Other microscopic hematuria: Secondary | ICD-10-CM

## 2020-03-19 DIAGNOSIS — I951 Orthostatic hypotension: Secondary | ICD-10-CM | POA: Diagnosis not present

## 2020-03-19 DIAGNOSIS — Z23 Encounter for immunization: Secondary | ICD-10-CM | POA: Diagnosis not present

## 2020-03-19 DIAGNOSIS — M160 Bilateral primary osteoarthritis of hip: Secondary | ICD-10-CM | POA: Diagnosis not present

## 2020-03-19 LAB — POCT URINALYSIS DIPSTICK
Bilirubin, UA: NEGATIVE
Glucose, UA: NEGATIVE
Ketones, UA: NEGATIVE
Leukocytes, UA: NEGATIVE
Nitrite, UA: NEGATIVE
Protein, UA: NEGATIVE
Spec Grav, UA: 1.02 (ref 1.010–1.025)
Urobilinogen, UA: 0.2 E.U./dL
pH, UA: 6 (ref 5.0–8.0)

## 2020-03-19 LAB — POCT GLYCOSYLATED HEMOGLOBIN (HGB A1C): Hemoglobin A1C: 5.3 % (ref 4.0–5.6)

## 2020-03-19 NOTE — Progress Notes (Signed)
Patient ID: Samuel Matthews, male    DOB: 02/14/1941, 80 y.o.   MRN: [id number]  This visit was conducted in person.  BP 130/68 (BP Location: Left Arm, Patient Position: Sitting)   Pulse 72   Temp (!) 97.2 F (36.2 C) (Temporal)   Ht 5' 9.49" (1.765 m)   Wt 194 lb 3.2 oz (88.1 kg)   SpO2 98%   BMI 28.28 kg/m    CC: CPE/preop eval Subjective:   HPI: Samuel Matthews is a 80 y.o. male presenting on 03/19/2020 for Annual Exam   Very healthy 80 yo (birthday yesterday!)   Saw health advisor last week for medicare wellness visit. Note reviewed.   Received pre-op request from Dr Alusio's office for upcoming R hip replacement scheduled on 04/22/20. Requests labs for today. He has tolerated general anesthesia well, latest 2014 for L hip replacement. No trouble waking up from surgery or postop nausea/vomiting. Denies chest pain, tightness, dyspnea, headache, or palpitations. See below for recent orthostatic dizziness - improved with better hydration status. H/o lidocaine reaction - severely dropped blood pressures.    Hearing Screening   125Hz  250Hz  500Hz  1000Hz  2000Hz  3000Hz  4000Hz  6000Hz  8000Hz   Right ear:    20   20    Left ear:    20   20    Vision Screening Comments: Had 4 months ago.   Flowsheet Row Clinical Support from 03/05/2020 in [facility]  PHQ-2 Total Score 0      Fall Risk  03/05/2020 02/08/2019 01/25/2019 12/16/2017 12/10/2016  Falls in the past year? 0 0 0 No No  Comment - Emmi Telephone Survey: data to providers prior to load - - -  Number falls in past yr: 0 - - - -  Injury with Fall? 0 - - - -  Risk for fall due to : No Fall Risks - - - -  Follow up Falls evaluation completed;Falls prevention discussed - - - -   Has noted some low orthostatic blood pressures 111/55. Attributes to possible dehydration.   Preventative: COLONOSCOPY Date: 07/2003 diverticulosis, int hem ([facility]).cologuard normal 2017. iFOB normal 01/2019. Healthy 80 yo. Will stop  screening at this time.  Prostate - never had screening. Age out.  Lung cancer screening - never smoker  Flu shot yearly, declines today.  [place]  03/2019, 04/2019, 02/2020. prevnar 2017, pneumovax 2013 Td 2010  zostavax 2015 Shingrix - 10/2019, 12/2019 Advanced directive - received and scanned 07/2014. Peter [place] wife is HCPOA, does not want prolonged life support (07/2014). Ok for reversible conditions. Seat belt use discussed.   Sunscreen use discussed. No changing moles on skin. Sees dermQ22mo Nicole [facility]). H/o melanomax2on back.  Non smoker Alcohol - very little Dentist Q4 mo  Eye exam yearly - s/p lasik and cataract surgery 2020  Bowel - no constipation/diarrhea Bladder - no incontinence. 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, [id number](stationary bicycle) Diet: fruits/vegetables daily, 2x/wk red meat, fish 1x/wk     Relevant past medical, surgical, family and social history reviewed and updated as indicated. Interim medical history since our last visit reviewed. Allergies and medications reviewed and updated. Outpatient Medications Prior to Visit  Medication Sig Dispense Refill  . Multiple Vitamins-Minerals (MULTIVITAMIN ADULT) TABS Take 1 tablet by mouth daily.  No facility-administered medications prior to visit.     Per HPI unless specifically indicated in ROS section below Review of Systems Objective:  BP 130/68 (BP Location: Left Arm, Patient Position: Sitting)   Pulse 72   Temp (!) 97.2 F (36.2 C) (Temporal)   Ht 5' 9.49" (1.765 m)   Wt 194 lb 3.2 oz (88.1 kg)   SpO2 98%   BMI 28.28 kg/m   Wt Readings from Last 3 Encounters:  03/19/20 194 lb 3.2 oz (88.1 kg)  01/25/19 190 lb 4 oz (86.3 kg)  07/29/18 195 lb 6 oz (88.6 kg)      Physical  Exam Vitals and nursing note reviewed.  Constitutional:      General: He is not in acute distress.    Appearance: Normal appearance. He is well-developed and well-nourished. He is not ill-appearing.  HENT:     Head: Normocephalic and atraumatic.     Right Ear: Hearing, tympanic membrane, ear canal and external ear normal.     Left Ear: Hearing, tympanic membrane, ear canal and external ear normal.     Mouth/Throat:     Mouth: Oropharynx is clear and moist and mucous membranes are normal.     Pharynx: No posterior oropharyngeal edema.  Eyes:     General: No scleral icterus.    Extraocular Movements: Extraocular movements intact and EOM normal.     Conjunctiva/sclera: Conjunctivae normal.     Pupils: Pupils are equal, round, and reactive to light.  Neck:     Thyroid: No thyroid mass or thyromegaly.     Vascular: No carotid bruit.  Cardiovascular:     Rate and Rhythm: Normal rate and regular rhythm.     Pulses: Normal pulses and intact distal pulses.          Radial pulses are 2+ on the right side and 2+ on the left side.     Heart sounds: Normal heart sounds. No murmur heard.   Pulmonary:     Effort: Pulmonary effort is normal. No respiratory distress.     Breath sounds: Normal breath sounds. No wheezing, rhonchi or rales.  Abdominal:     General: Abdomen is flat. Bowel sounds are normal. There is no distension.     Palpations: Abdomen is soft. There is no mass.     Tenderness: There is no abdominal tenderness. There is no guarding or rebound.     Hernia: No hernia is present.  Musculoskeletal:        General: No edema. Normal range of motion.     Cervical back: Normal range of motion and neck supple.     Right lower leg: No edema.     Left lower leg: No edema.  Lymphadenopathy:     Cervical: No cervical adenopathy.  Skin:    General: Skin is warm and dry.     Findings: No rash.  Neurological:     General: No focal deficit present.     Mental Status: He is alert and  oriented to person, place, and time.     Comments: CN grossly intact, station and gait intact  Psychiatric:        Mood and Affect: Mood and affect and mood normal.        Behavior: Behavior normal.        Thought Content: Thought content normal.        Judgment: Judgment normal.       Results for orders placed or performed in visit on 03/19/20  POCT glycosylated hemoglobin (Hb A1C)  Result Value Ref Range   Hemoglobin A1C 5.3 4.0 - 5.6 %   HbA1c POC (<> result, manual entry)     HbA1c, POC (prediabetic range)     HbA1c, POC (controlled diabetic range)    Urinalysis Dipstick  Result Value Ref Range   Color, UA Yellow    Clarity, UA Clear    Glucose, UA Negative Negative   Bilirubin, UA Negative    Ketones, UA Negative    Spec Grav, UA 1.020 1.010 - 1.025   Blood, UA 3+    pH, UA 6.0 5.0 - 8.0   Protein, UA Negative Negative   Urobilinogen, UA 0.2 0.2 or 1.0 E.U./dL   Nitrite, UA Negative    Leukocytes, UA Negative Negative   Appearance     Odor     Lab Results  Component Value Date   CREATININE 1.11 03/12/2020   BUN 21 03/12/2020   NA 139 03/12/2020   K 4.1 03/12/2020   CL 108 03/12/2020   CO2 27 03/12/2020    Lab Results  Component Value Date   ALT 14 03/12/2020   AST 18 03/12/2020   ALKPHOS 29 (L) 03/12/2020   BILITOT 0.6 03/12/2020    Lab Results  Component Value Date   CHOL 158 03/12/2020   HDL 51.70 03/12/2020   LDLCALC 99 03/12/2020   TRIG 37.0 03/12/2020   CHOLHDL 3 03/12/2020    Lab Results  Component Value Date   WBC 6.2 03/12/2020   HGB 13.4 03/12/2020   HCT 39.1 03/12/2020   MCV 92.1 03/12/2020   PLT 226.0 03/12/2020   DG Chest 2 View CLINICAL DATA:  Preop evaluation  EXAM: CHEST - 2 VIEW  COMPARISON:  None.  FINDINGS: The heart size and mediastinal contours are within normal limits. Both lungs are clear. The visualized skeletal structures are unremarkable.  IMPRESSION: No active cardiopulmonary disease.  Electronically  Signed   By: Marlan Palau M.D.   On: 03/19/2020 13:36   EKG - NSR rate 60s, normal axis, intervals, no hypertrophy or acute ST/T changes Assessment & Plan:  This visit occurred during the SARS-CoV-2 public health emergency.  Safety protocols were in place, including screening questions prior to the visit, additional usage of staff PPE, and extensive cleaning of exam room while observing appropriate contact time as indicated for disinfecting solutions.   Problem List Items Addressed This Visit    Pre-op evaluation - Primary    RCRI = 0 Check CXR, EKG, UA and A1c as well as other requested labs.  Anticipate adequately low risk to proceed with upcoming planned surgery.  He is not on blood thinner.  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 Don't recommend significant weight loss.       Orthostatic hypotension    Ongoing.  Reviewed importance of good hydration status - he could do better in winter months.       OA (osteoarthritis) of hip    S/p L hip replacement 2014.  Planning R hip replacement next month (Alusio).       Microhematuria    Microhematuria persists.  Completed reassuring urological evaluation 02/2019 with CT scan and cystoscopy - showing moderate obstructing BPH.  Atherosclerosis of aorta ([place])    Other Visit Diagnoses    Hyperglycemia       Relevant Orders   POCT glycosylated hemoglobin (Hb A1C) (Completed)   Need for influenza vaccination       Relevant Orders   Flu Vaccine QUAD High Dose(Fluad) (Completed)       No orders of the defined types were placed in this encounter.  Orders Placed This Encounter  Procedures  . DG Chest 2 View    Standing Status:   Future    Number of Occurrences:   1     Standing Expiration Date:   03/19/2021    Order Specific Question:   Reason for Exam (SYMPTOM  OR DIAGNOSIS REQUIRED)    Answer:   preop eval    Order Specific Question:   Preferred imaging location?    Answer:   [facility]  . Flu Vaccine QUAD High Dose(Fluad)  . POCT glycosylated hemoglobin (Hb A1C)  . Urinalysis Dipstick  . EKG 12-Lead    Patient instructions: Flu shot today  Chest xray today  Urinalysis and fingerstick A1c today for preop evaluation.  We will forward results to Dr [name]'s office.  You are doing well today - work on good hydration status.  Good to see you today, return as needed or in 1 year for next wellness visit.   Follow up plan: Return in about 1 year (around 03/19/2021) for annual exam, prior fasting for blood work, medicare wellness visit.  Ria Bush, MD

## 2020-03-19 NOTE — Patient Instructions (Addendum)
Flu shot today  Chest xray today  Urinalysis and fingerstick A1c today for preop evaluation.  We will forward results to Dr [name]'s office.  You are doing well today - work on good hydration status.  Good to see you today, return as needed or in 1 year for next wellness visit.   Health Maintenance After Age 82 After age 75, you are at a higher risk for certain long-term diseases and infections as well as injuries from falls. Falls are a major cause of broken bones and head injuries in people who are older than age 54. Getting regular preventive care can help to keep you healthy and well. Preventive care includes getting regular testing and making lifestyle changes as recommended by your health care provider. Talk with your health care provider about:  Which screenings and tests you should have. A screening is a test that checks for a disease when you have no symptoms.  A diet and exercise plan that is right for you. What should I know about screenings and tests to prevent falls? Screening and testing are the best ways to find a health problem early. Early diagnosis and treatment give you the best chance of managing medical conditions that are common after age 30. Certain conditions and lifestyle choices may make you more likely to have a fall. Your health care provider may recommend:  Regular vision checks. Poor vision and conditions such as cataracts can make you more likely to have a fall. If you wear glasses, make sure to get your prescription updated if your vision changes.  Medicine review. Work with your health care provider to regularly review all of the medicines you are taking, including over-the-counter medicines. Ask your health care provider about any side effects that may make you more likely to have a fall. Tell your health care provider if any medicines that you take make you feel dizzy or sleepy.  Osteoporosis screening. Osteoporosis is a condition that causes the bones to get  weaker. This can make the bones weak and cause them to break more easily.  Blood pressure screening. Blood pressure changes and medicines to control blood pressure can make you feel dizzy.  Strength and balance checks. Your health care provider may recommend certain tests to check your strength and balance while standing, walking, or changing positions.  Foot health exam. Foot pain and numbness, as well as not wearing proper footwear, can make you more likely to have a fall.  Depression screening. You may be more likely to have a fall if you have a fear of falling, feel emotionally low, or feel unable to do activities that you used to do.  Alcohol use screening. Using too much alcohol can affect your balance and may make you more likely to have a fall. What actions can I take to lower my risk of falls? General instructions  Talk with your health care provider about your risks for falling. Tell your health care provider if: ? You fall. Be sure to tell your health care provider about all falls, even ones that seem minor. ? You feel dizzy, sleepy, or off-balance.  Take over-the-counter and prescription medicines only as told by your health care provider. These include any supplements.  Eat a healthy diet and maintain a healthy weight. A healthy diet includes low-fat dairy products, low-fat (lean) meats, and fiber from whole grains, beans, and lots of fruits and vegetables. Home safety  Remove any tripping hazards, such as rugs, cords, and clutter.  Install safety  equipment such as grab bars in bathrooms and safety rails on stairs.  Keep rooms and walkways well-lit. Activity   Follow a regular exercise program to stay fit. This will help you maintain your balance. Ask your health care provider what types of exercise are appropriate for you.  If you need a cane or walker, use it as recommended by your health care provider.  Wear supportive shoes that have nonskid soles. Lifestyle  Do  not drink alcohol if your health care provider tells you not to drink.  If you drink alcohol, limit how much you have: ? 0-1 drink a day for women. ? 0-2 drinks a day for men.  Be aware of how much alcohol is in your drink. In the [place] one drink equals one typical bottle of beer (12 oz), one-half glass of wine (5 oz), or one shot of hard liquor (1 oz).  Do not use any products that contain nicotine or tobacco, such as cigarettes and e-cigarettes. If you need help quitting, ask your health care provider. Summary  Having a healthy lifestyle and getting preventive care can help to protect your health and wellness after age 64.  Screening and testing are the best way to find a health problem early and help you avoid having a fall. Early diagnosis and treatment give you the best chance for managing medical conditions that are more common for people who are older than age 55.  Falls are a major cause of broken bones and head injuries in people who are older than age 20. Take precautions to prevent a fall at home.  Work with your health care provider to learn what changes you can make to improve your health and wellness and to prevent falls. This information is not intended to replace advice given to you by your health care provider. Make sure you discuss any questions you have with your health care provider. Document Revised: 06/23/2018 Document Reviewed: 01/13/2017 Elsevier Patient Education  2020 [employer].

## 2020-03-21 NOTE — Assessment & Plan Note (Signed)
Stable period, stable weight. Don't recommend significant weight loss.

## 2020-03-21 NOTE — Assessment & Plan Note (Signed)
Ongoing.  Reviewed importance of good hydration status - he could do better in winter months.

## 2020-03-21 NOTE — Assessment & Plan Note (Signed)
RCRI = 0 Check CXR, EKG, UA and A1c as well as other requested labs.  Anticipate adequately low risk to proceed with upcoming planned surgery.  He is not on blood thinner.  See below re: chronic microhematuria

## 2020-03-21 NOTE — Assessment & Plan Note (Signed)
S/p L hip replacement 2014.  Planning R hip replacement next month (Alusio).

## 2020-03-21 NOTE — Assessment & Plan Note (Signed)
Microhematuria persists.  Completed reassuring urological evaluation 02/2019 with CT scan and cystoscopy - showing moderate obstructing BPH.

## 2020-04-09 NOTE — Progress Notes (Signed)
DUE TO COVID-19 ONLY ONE VISITOR IS ALLOWED TO COME WITH YOU AND STAY IN THE WAITING ROOM ONLY DURING PRE OP AND PROCEDURE DAY OF SURGERY. THE 1 VISITOR  MAY VISIT WITH YOU AFTER SURGERY IN YOUR PRIVATE ROOM DURING VISITING HOURS ONLY!  YOU NEED TO HAVE A COVID 19 TEST ON__2/05/2020 _____ @_______ , THIS TEST MUST BE DONE BEFORE SURGERY,  COVID TESTING SITE [address] [place] [address], IT IS ON THE RIGHT GOING OUT [address] APPROXIMATELY  2 MINUTES PAST [employer] ON THE RIGHT. ONCE YOUR COVID TEST IS COMPLETED,  PLEASE BEGIN THE QUARANTINE INSTRUCTIONS AS OUTLINED IN YOUR HANDOUT.                Samuel Matthews  04/09/2020   Your procedure is scheduled on: 04/22/2020    Report to [facility] Main  Entrance   Report to admitting at     1145 AM     Call this number if you have problems the morning of surgery [phone]    REMEMBER: NO  SOLID FOOD CANDY OR GUM AFTER MIDNIGHT. CLEAR LIQUIDS UNTIL   1115 am       . NOTHING BY MOUTH EXCEPT CLEAR LIQUIDS UNTIL    . PLEASE FINISH ENSURE DRINK PER SURGEON ORDER  WHICH NEEDS TO BE COMPLETED AT   1115am    .      CLEAR LIQUID DIET   Foods Allowed                                                                    Coffee and tea, regular and decaf                            Fruit ices (not with fruit pulp)                                      Iced Popsicles                                    Carbonated beverages, regular and diet                                    Cranberry, grape and apple juices Sports drinks like Gatorade Lightly seasoned clear broth or consume(fat free) Sugar, honey syrup ___________________________________________________________________      BRUSH YOUR TEETH MORNING OF SURGERY AND RINSE YOUR MOUTH OUT, NO CHEWING GUM CANDY OR MINTS.     Take these medicines the morning of surgery with A SIP OF WATER: none   DO NOT TAKE ANY DIABETIC MEDICATIONS DAY OF YOUR SURGERY                                You may not have any metal on your body including hair pins and              piercings  Do not wear jewelry, make-up, lotions, powders or perfumes, deodorant             Do not wear nail polish on your fingernails.  Do not shave  48 hours prior to surgery.              Men may shave face and neck.   Do not bring valuables to the hospital. [place].  Contacts, dentures or bridgework may not be worn into surgery.  Leave suitcase in the car. After surgery it may be brought to your room.     Patients discharged the day of surgery will not be allowed to drive home. IF YOU ARE HAVING SURGERY AND GOING HOME THE SAME DAY, YOU MUST HAVE AN ADULT TO DRIVE YOU HOME AND BE WITH YOU FOR 24 HOURS. YOU MAY GO HOME BY TAXI OR UBER OR ORTHERWISE, BUT AN ADULT MUST ACCOMPANY YOU HOME AND STAY WITH YOU FOR 24 HOURS.  Name and phone number of your driver:  Special Instructions: N/A              Please read over the following fact sheets you were given: _____________________________________________________________________  [facility] - Preparing for Surgery Before surgery, you can play an important role.  Because skin is not sterile, your skin needs to be as free of germs as possible.  You can reduce the number of germs on your skin by washing with CHG (chlorahexidine gluconate) soap before surgery.  CHG is an antiseptic cleaner which kills germs and bonds with the skin to continue killing germs even after washing. Please DO NOT use if you have an allergy to CHG or antibacterial soaps.  If your skin becomes reddened/irritated stop using the CHG and inform your nurse when you arrive at Short Stay. Do not shave (including legs and underarms) for at least 48 hours prior to the first CHG shower.  You may shave your face/neck. Please follow these instructions carefully:  1.  Shower with CHG Soap the night before surgery and the  morning of  Surgery.  2.  If you choose to wash your hair, wash your hair first as usual with your  normal  shampoo.  3.  After you shampoo, rinse your hair and body thoroughly to remove the  shampoo.                           4.  Use CHG as you would any other liquid soap.  You can apply chg directly  to the skin and wash                       Gently with a scrungie or clean washcloth.  5.  Apply the CHG Soap to your body ONLY FROM THE NECK DOWN.   Do not use on face/ open                           Wound or open sores. Avoid contact with eyes, ears mouth and genitals (private parts).                       Wash face,  Genitals (private parts) with your normal soap.             6.  Wash  thoroughly, paying special attention to the area where your surgery  will be performed.  7.  Thoroughly rinse your body with warm water from the neck down.  8.  DO NOT shower/wash with your normal soap after using and rinsing off  the CHG Soap.                9.  Pat yourself dry with a clean towel.            10.  Wear clean pajamas.            11.  Place clean sheets on your bed the night of your first shower and do not  sleep with pets. Day of Surgery : Do not apply any lotions/deodorants the morning of surgery.  Please wear clean clothes to the hospital/surgery center.  FAILURE TO FOLLOW THESE INSTRUCTIONS MAY RESULT IN THE CANCELLATION OF YOUR SURGERY PATIENT SIGNATURE_________________________________  NURSE SIGNATURE__________________________________  ________________________________________________________________________

## 2020-04-09 NOTE — Progress Notes (Addendum)
Anesthesia Review:  PCP: DR Lawerance Bach- LOV 03/19/2020  Cardiologist : Chest x-ray : 03/19/2020  EKG :03/21/2020  Echo : Stress test: Cardiac Cath :  Activity level:  Sleep Study/ CPAP : Fasting Blood Sugar :      / Checks Blood Sugar -- times a day:   Blood Thinner/ Instructions /Last Dose: ASA / Instructions/ Last Dose :  Hx of / lidocaine reaction in 01/2019.  See Anesthesia hx in History Have requested office note and procedure note from 01/2019 vist with DR Nicole Kindred with [facility] [phone].

## 2020-04-09 NOTE — H&P (Signed)
TOTAL HIP ADMISSION H&P  Patient is admitted for right total hip arthroplasty.  Subjective:  Chief Complaint: Right hip pain  HPI: Samuel Matthews, 80 y.o. male, has a history of pain and functional disability in the right hip due to arthritis and patient has failed non-surgical conservative treatments for greater than 12 weeks to include NSAID's and/or analgesics and activity modification. Onset of symptoms was gradual, starting several years ago with gradually worsening course since that time. The patient noted no past surgery on the right hip. Patient currently rates pain in the right hip at 5 out of 10 with activity. Patient has worsening of pain with activity and weight bearing and pain that interfers with activities of daily living. AP pelvis, AP and lateral X-Ray of the left hip and lateral of the right hip dated 02/15/2020 demonstrate a left hip prosthesis in good position with no periprosthetic abnormalities. On the right, he has bone-on-bone arthritis with subchondral cystic formation and osteophyte formation. This condition presents safety issues increasing the risk of falls. There is no current active infection.  Patient Active Problem List   Diagnosis Date Noted  . Atherosclerosis of aorta ([place]) 01/31/2019  . Microhematuria 01/25/2019  . Acquired renal cyst of right kidney 01/24/2018  . Orthostatic hypotension 01/18/2018  . Overweight with body mass index (BMI) 25.0-29.9 12/16/2017  . Advanced care planning/counseling discussion 07/22/2014  . History of melanoma in situ   . OA (osteoarthritis) of hip 02/03/2013  . Pre-op evaluation 12/13/2012  . Medicare annual wellness visit, subsequent 04/15/2011    Past Medical History:  Diagnosis Date  . Actinic keratosis   . Basal cell carcinoma 12/07/2016   L nasal dorsum inferior  . Basal cell carcinoma 11/25/2015   L postauricular area  . Basal cell carcinoma 05/06/2015   R chest, L neck  . Basal cell carcinoma 10/30/2014   L ant  shoulder  . Basal cell carcinoma 02/06/2020   L med posterior shoulder, [facility]  . Basal cell carcinoma of nose 2015   ([name])  . Cancer ([facility])    Hx: of squamous cell left calf  . Diverticulosis 2005   by colonoscopy  . History of chicken pox   . 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 History of pneumothorax 1982   rib fracture, from a fall  . 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 Lower back pain 2012   thought HNP, improved with PT  . Osteoarthritis of left hip   . Pneumothorax, acute    Hx: of  . 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 CATARACT EXTRACTION Bilateral 10/2014  . cataracts    . CHEST TUBE INSERTION  1982  . COLONOSCOPY  07/2003   diverticulosis, int hem ([place])  . [place]  . LASIK    . SKIN CANCER EXCISION  1993, 2014   Left calf  . TOTAL HIP ARTHROPLASTY Left 02/03/2013   [name], MD  . TRIGGER FINGER RELEASE  2001    Prior to Admission medications   Medication Sig Start Date End Date Taking? Authorizing Provider  Multiple Vitamins-Minerals (MULTIVITAMIN ADULT) TABS Take 1 tablet  by mouth daily. 01/25/19  Yes Ria Bush, MD    Allergies  Allergen Reactions  . Lidocaine Other (See Comments)    Dropped blood pressures severely  . Sulfa Drugs Cross Reactors Hives    Social History   Socioeconomic History  . Marital status: Married    Spouse name: Not on file  . Number of children: Not on file  . Years of education: Not on file  . Highest education level: Not on file  Occupational History  . Occupation: Retired  Tobacco Use  . Smoking status: Never Smoker  . Smokeless tobacco: Never Used  Vaping Use  . Vaping Use: Never used   Substance and Sexual Activity  . Alcohol use: Yes    Comment: Occasional  . Drug use: No  . Sexual activity: Never  Other Topics Concern  . 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, gym twice weekly.    Diet: fruits/vegetables daily, 2x/wk red meat, fish 1x/wk   Social Determinants of Health   Financial Resource Strain: Low Risk   . Difficulty of Paying Living Expenses: Not hard at all  Food Insecurity: No Food Insecurity  . Worried About Charity fundraiser in the Last Year: Never true  . Ran Out of Food in the Last Year: Never true  Transportation Needs: No Transportation Needs  . Lack of Transportation (Medical): No  . Lack of Transportation (Non-Medical): No  Physical Activity: Sufficiently Active  . Days of Exercise per Week: 2 days  . Minutes of Exercise per Session: 120 min  Stress: No Stress Concern Present  . Feeling of Stress : Not at all  Social Connections: Not on file  Intimate Partner Violence: Not At Risk  . Fear of Current or Ex-Partner: No  . Emotionally Abused: No  . Physically Abused: No  . Sexually Abused: No    Tobacco Use: Low Risk   . Smoking Tobacco Use: Never Smoker  . Smokeless Tobacco Use: Never Used   Social History   Substance and Sexual Activity  Alcohol Use Yes   Comment: Occasional    Family History  Problem Relation Age of Onset  . Coronary artery disease Mother 33  . Hypertension Father   . CVA Father   . Healthy Sister   . Healthy Brother   . Diabetes Neg Hx   . Cancer Neg Hx     Review of Systems  Constitutional: Negative for chills, fever and weight loss.  Respiratory: Negative for cough and shortness of breath.   Cardiovascular: Negative for chest pain and palpitations.   Gastrointestinal: Negative for nausea and vomiting.  Musculoskeletal: Positive for joint pain.  Neurological: Negative for dizziness, tingling and headaches.     Objective:  Physical Exam: Well nourished and well developed.  General: Alert and oriented x3, cooperative and pleasant, no acute distress.  Head: normocephalic, atraumatic, neck supple.  Eyes: EOMI.  Respiratory: breath sounds clear in all fields, no wheezing, rales, or rhonchi. Cardiovascular: Regular rate and rhythm, no murmurs, gallops or rubs.  Abdomen: non-tender to palpation and soft, normoactive bowel sounds. Musculoskeletal:  Right Hip Exam:  The range of motion: Flexion to 100 degrees, Internal Rotation is minimal to no degrees, External Rotation to 10  to 20 degrees, and abduction to 30 degrees without discomfort.  There is no tenderness over the greater trochanteric bursa.    Left Hip Exam:  The range of motion: Flexion to 120 degrees, Internal Rotation to 30 degrees, External Rotation to 40 degrees, and abduction to 40 degrees without discomfort.  Well-healed scar.  There is no tenderness over the greater trochanteric bursa    Calves soft and nontender. Motor function intact in LE. Strength 5/5 LE bilaterally. Neuro: Distal pulses 2+. Sensation to light touch intact in LE.  Vital signs in last 24 hours: BP: ()/()  Arterial Line BP: ()/()   Imaging Review AP pelvis, AP and lateral X-Ray of the left hip and lateral of the right hip dated 02/15/2020 demonstrate a left hip prosthesis in good position with no periprosthetic abnormalities. On the right, he has bone-on-bone arthritis with subchondral cystic formation and osteophyte formation.   Assessment/Plan:  End stage arthritis, right hip  The patient history, physical examination, clinical judgement of the provider and imaging studies are consistent with end stage degenerative joint disease of the right hip and total hip arthroplasty is deemed medically  necessary. The treatment options including medical management, injection therapy, arthroscopy and arthroplasty were discussed at length. The risks and benefits of total hip arthroplasty were presented and reviewed. The risks due to aseptic loosening, infection, stiffness, dislocation/subluxation, thromboembolic complications and other imponderables were discussed. The patient acknowledged the explanation, agreed to proceed with the plan and consent was signed. Patient is being admitted for inpatient treatment for surgery, pain control, PT, OT, prophylactic antibiotics, VTE prophylaxis, progressive ambulation and ADLs and discharge planning.The patient is planning to be discharged home.   Patient's anticipated LOS is less than 2 midnights, meeting these requirements: - Lives within 1 hour of care - Has a competent adult at home to recover with post-op recover - NO history of  - Chronic pain requiring opiods  - Diabetes  - Coronary Artery Disease  - Heart failure  - Heart attack  - Stroke  - DVT/VTE  - Cardiac arrhythmia  - Respiratory Failure/COPD  - Renal failure  - Anemia  - Advanced Liver disease  Therapy Plans: HEP Disposition: Home with Wife Planned DVT Prophylaxis: Xarelto (history of melanoma) DME Needed: None PCP: Dr. Ria Bush (Clearance in Epic) TXA: IV Allergies: Sulfa allergy in high school; possibly Lidocaine (had hypotensive reaction that lead to three-day stay in hospital) Anesthesia Concerns: None BMI: 27.5 Last HgbA1c: N/A  Pharmacy: [place]/[facility])  - Patient was instructed on what medications to stop prior to surgery. - Follow-up visit in 2 weeks with Dr. Wynelle Link - Begin physical therapy following surgery - Pre-operative lab work as pre-surgical testing - Prescriptions will be provided in hospital at time of discharge  Fenton Foy, [facility], PA-C Orthopedic Surgery [facility] [place]

## 2020-04-10 ENCOUNTER — Other Ambulatory Visit: Payer: Self-pay

## 2020-04-10 ENCOUNTER — Encounter (HOSPITAL_COMMUNITY): Payer: Self-pay

## 2020-04-10 ENCOUNTER — Encounter (HOSPITAL_COMMUNITY)
Admission: RE | Admit: 2020-04-10 | Discharge: 2020-04-10 | Disposition: A | Discharge status: Home / Self Care | Payer: Medicare Other | Source: Ambulatory Visit | Attending: Orthopedic Surgery | Admitting: Orthopedic Surgery

## 2020-04-10 DIAGNOSIS — Z01812 Encounter for preprocedural laboratory examination: Secondary | ICD-10-CM | POA: Insufficient documentation

## 2020-04-10 HISTORY — DX: Other complications of anesthesia, initial encounter: T88.59XA

## 2020-04-10 LAB — CBC
HCT: 41.2 % (ref 39.0–52.0)
Hemoglobin: 14 g/dL (ref 13.0–17.0)
MCH: 32.1 pg (ref 26.0–34.0)
MCHC: 34 g/dL (ref 30.0–36.0)
MCV: 94.5 fL (ref 80.0–100.0)
Platelets: 207 10*3/uL (ref 150–400)
RBC: 4.36 MIL/uL (ref 4.22–5.81)
RDW: 11.9 % (ref 11.5–15.5)
WBC: 6.5 10*3/uL (ref 4.0–10.5)
nRBC: 0 % (ref 0.0–0.2)

## 2020-04-10 LAB — COMPREHENSIVE METABOLIC PANEL
ALT: 17 U/L (ref 0–44)
AST: 18 U/L (ref 15–41)
Albumin: 3.9 g/dL (ref 3.5–5.0)
Alkaline Phosphatase: 30 U/L — ABNORMAL LOW (ref 38–126)
Anion gap: 8 (ref 5–15)
BUN: 18 mg/dL (ref 8–23)
CO2: 26 mmol/L (ref 22–32)
Calcium: 9.3 mg/dL (ref 8.9–10.3)
Chloride: 106 mmol/L (ref 98–111)
Creatinine, Ser: 1.06 mg/dL (ref 0.61–1.24)
GFR, Estimated: >60 mL/min (ref >60)
Glucose, Bld: 105 mg/dL — ABNORMAL HIGH (ref 70–99)
Potassium: 4.8 mmol/L (ref 3.5–5.1)
Sodium: 140 mmol/L (ref 135–145)
Total Bilirubin: 0.6 mg/dL (ref 0.3–1.2)
Total Protein: 6.4 g/dL — ABNORMAL LOW (ref 6.5–8.1)

## 2020-04-10 LAB — TYPE AND SCREEN
ABO/RH(D): A NEG
Antibody Screen: NEGATIVE

## 2020-04-10 LAB — SURGICAL PCR SCREEN
MRSA, PCR: NEGATIVE
Staphylococcus aureus: NEGATIVE

## 2020-04-10 LAB — PROTIME-INR
INR: 1 (ref 0.8–1.2)
Prothrombin Time: 13.2 seconds (ref 11.4–15.2)

## 2020-04-10 LAB — APTT: aPTT: 30 seconds (ref 24–36)

## 2020-04-18 ENCOUNTER — Other Ambulatory Visit (HOSPITAL_COMMUNITY)
Admission: RE | Admit: 2020-04-18 | Discharge: 2020-04-18 | Disposition: A | Discharge status: Home / Self Care | Payer: Medicare Other | Source: Ambulatory Visit | Attending: Orthopedic Surgery | Admitting: Orthopedic Surgery

## 2020-04-18 DIAGNOSIS — Z20822 Contact with and (suspected) exposure to covid-19: Secondary | ICD-10-CM | POA: Diagnosis not present

## 2020-04-18 DIAGNOSIS — Z01812 Encounter for preprocedural laboratory examination: Secondary | ICD-10-CM | POA: Insufficient documentation

## 2020-04-18 LAB — SARS CORONAVIRUS 2 (TAT 6-24 HRS): SARS Coronavirus 2: NEGATIVE

## 2020-04-22 ENCOUNTER — Other Ambulatory Visit: Payer: Self-pay

## 2020-04-22 ENCOUNTER — Encounter (HOSPITAL_COMMUNITY)
Admission: RE | Disposition: A | Discharge status: Home / Self Care | Payer: Self-pay | Source: Home / Self Care | Attending: Orthopedic Surgery

## 2020-04-22 ENCOUNTER — Ambulatory Visit (HOSPITAL_COMMUNITY): Payer: Medicare Other

## 2020-04-22 ENCOUNTER — Observation Stay (HOSPITAL_COMMUNITY)
Admission: RE | Admit: 2020-04-22 | Discharge: 2020-04-23 | Disposition: A | Discharge status: Home / Self Care | Payer: Medicare Other | Attending: Orthopedic Surgery | Admitting: Orthopedic Surgery

## 2020-04-22 ENCOUNTER — Ambulatory Visit (HOSPITAL_COMMUNITY): Payer: Medicare Other | Admitting: Certified Registered"

## 2020-04-22 ENCOUNTER — Encounter (HOSPITAL_COMMUNITY): Payer: Self-pay | Admitting: Orthopedic Surgery

## 2020-04-22 ENCOUNTER — Ambulatory Visit (HOSPITAL_COMMUNITY): Payer: Medicare Other | Admitting: Physician Assistant

## 2020-04-22 ENCOUNTER — Observation Stay (HOSPITAL_COMMUNITY): Payer: Medicare Other

## 2020-04-22 DIAGNOSIS — Z96642 Presence of left artificial hip joint: Secondary | ICD-10-CM | POA: Diagnosis not present

## 2020-04-22 DIAGNOSIS — Z85828 Personal history of other malignant neoplasm of skin: Secondary | ICD-10-CM | POA: Diagnosis not present

## 2020-04-22 DIAGNOSIS — Z96641 Presence of right artificial hip joint: Secondary | ICD-10-CM | POA: Diagnosis not present

## 2020-04-22 DIAGNOSIS — I951 Orthostatic hypotension: Secondary | ICD-10-CM | POA: Diagnosis not present

## 2020-04-22 DIAGNOSIS — Z471 Aftercare following joint replacement surgery: Secondary | ICD-10-CM | POA: Diagnosis not present

## 2020-04-22 DIAGNOSIS — M25551 Pain in right hip: Secondary | ICD-10-CM

## 2020-04-22 DIAGNOSIS — M1611 Unilateral primary osteoarthritis, right hip: Secondary | ICD-10-CM | POA: Diagnosis not present

## 2020-04-22 DIAGNOSIS — Z96649 Presence of unspecified artificial hip joint: Secondary | ICD-10-CM

## 2020-04-22 HISTORY — PX: TOTAL HIP ARTHROPLASTY: SHX124

## 2020-04-22 SURGERY — ARTHROPLASTY, HIP, TOTAL, ANTERIOR APPROACH
Anesthesia: Spinal | Site: Hip | Laterality: Right

## 2020-04-22 MED ORDER — HYDROCODONE-ACETAMINOPHEN 5-325 MG PO TABS
1.0000 | ORAL_TABLET | ORAL | Status: DC | PRN
Start: 2020-04-22 — End: 2020-04-23
  Administered 2020-04-22 – 2020-04-23 (×4): 2 via ORAL
  Filled 2020-04-22 (×4): qty 2

## 2020-04-22 MED ORDER — ONDANSETRON HCL 4 MG/2ML IJ SOLN: 4.0000 mg | Freq: Four times a day (QID) | INTRAMUSCULAR | Status: DC | PRN

## 2020-04-22 MED ORDER — ACETAMINOPHEN 10 MG/ML IV SOLN
1000.0000 mg | Freq: Four times a day (QID) | INTRAVENOUS | Status: DC
  Administered 2020-04-22: 1000 mg via INTRAVENOUS
  Filled 2020-04-22: qty 100

## 2020-04-22 MED ORDER — DEXAMETHASONE SODIUM PHOSPHATE 10 MG/ML IJ SOLN
8.0000 mg | Freq: Once | INTRAMUSCULAR | Status: AC
  Administered 2020-04-22: 8 mg via INTRAVENOUS

## 2020-04-22 MED ORDER — OXYCODONE HCL 5 MG/5ML PO SOLN: 5.0000 mg | Freq: Once | ORAL | Status: DC | PRN

## 2020-04-22 MED ORDER — BUPIVACAINE IN DEXTROSE 0.75-8.25 % IT SOLN
INTRATHECAL | Status: DC | PRN
  Administered 2020-04-22: 1.8 mL via INTRATHECAL

## 2020-04-22 MED ORDER — PHENYLEPHRINE HCL (PRESSORS) 10 MG/ML IV SOLN
INTRAVENOUS | Status: AC
  Filled 2020-04-22: qty 1

## 2020-04-22 MED ORDER — HYDROCODONE-ACETAMINOPHEN 7.5-325 MG PO TABS: 1.0000 | ORAL_TABLET | ORAL | Status: DC | PRN

## 2020-04-22 MED ORDER — WATER FOR IRRIGATION, STERILE IR SOLN
Status: DC | PRN
  Administered 2020-04-22: 2000 mL

## 2020-04-22 MED ORDER — SODIUM CHLORIDE 0.9 % IV SOLN: INTRAVENOUS | Status: DC

## 2020-04-22 MED ORDER — ONDANSETRON HCL 4 MG PO TABS: 4.0000 mg | ORAL_TABLET | Freq: Four times a day (QID) | ORAL | Status: DC | PRN

## 2020-04-22 MED ORDER — PROPOFOL 10 MG/ML IV BOLUS
INTRAVENOUS | Status: DC | PRN
  Administered 2020-04-22 (×2): 10 mg via INTRAVENOUS

## 2020-04-22 MED ORDER — ONDANSETRON HCL 4 MG/2ML IJ SOLN
INTRAMUSCULAR | Status: DC | PRN
  Administered 2020-04-22: 4 mg via INTRAVENOUS

## 2020-04-22 MED ORDER — MAGNESIUM CITRATE PO SOLN: 1.0000 | Freq: Once | ORAL | Status: DC | PRN

## 2020-04-22 MED ORDER — MENTHOL 3 MG MT LOZG: 1.0000 | LOZENGE | OROMUCOSAL | Status: DC | PRN

## 2020-04-22 MED ORDER — METOCLOPRAMIDE HCL 5 MG PO TABS: 5.0000 mg | ORAL_TABLET | Freq: Three times a day (TID) | ORAL | Status: DC | PRN

## 2020-04-22 MED ORDER — RIVAROXABAN 10 MG PO TABS
10.0000 mg | ORAL_TABLET | Freq: Every day | ORAL | Status: DC
  Administered 2020-04-23: 10 mg via ORAL
  Filled 2020-04-22: qty 1

## 2020-04-22 MED ORDER — METHOCARBAMOL 500 MG IVPB - SIMPLE MED
500.0000 mg | Freq: Four times a day (QID) | INTRAVENOUS | Status: DC | PRN
  Filled 2020-04-22: qty 50

## 2020-04-22 MED ORDER — DEXAMETHASONE SODIUM PHOSPHATE 10 MG/ML IJ SOLN
10.0000 mg | Freq: Once | INTRAMUSCULAR | Status: AC
  Administered 2020-04-23: 10 mg via INTRAVENOUS
  Filled 2020-04-22: qty 1

## 2020-04-22 MED ORDER — HYDROMORPHONE HCL 1 MG/ML IJ SOLN: 0.2500 mg | INTRAMUSCULAR | Status: DC | PRN

## 2020-04-22 MED ORDER — DEXAMETHASONE SODIUM PHOSPHATE 10 MG/ML IJ SOLN
INTRAMUSCULAR | Status: AC
  Filled 2020-04-22: qty 1

## 2020-04-22 MED ORDER — PROPOFOL 1000 MG/100ML IV EMUL
INTRAVENOUS | Status: AC
  Filled 2020-04-22: qty 100

## 2020-04-22 MED ORDER — ONDANSETRON HCL 4 MG/2ML IJ SOLN
INTRAMUSCULAR | Status: AC
  Filled 2020-04-22: qty 2

## 2020-04-22 MED ORDER — POVIDONE-IODINE 10 % EX SWAB
2.0000 "application " | Freq: Once | CUTANEOUS | Status: AC
  Administered 2020-04-22: 2 via TOPICAL

## 2020-04-22 MED ORDER — BISACODYL 10 MG RE SUPP: 10.0000 mg | Freq: Every day | RECTAL | Status: DC | PRN

## 2020-04-22 MED ORDER — METHOCARBAMOL 500 MG PO TABS
500.0000 mg | ORAL_TABLET | Freq: Four times a day (QID) | ORAL | Status: DC | PRN
  Administered 2020-04-22: 500 mg via ORAL
  Filled 2020-04-22: qty 1

## 2020-04-22 MED ORDER — CEFAZOLIN SODIUM-DEXTROSE 2-4 GM/100ML-% IV SOLN
2.0000 g | Freq: Four times a day (QID) | INTRAVENOUS | Status: AC
  Administered 2020-04-22 – 2020-04-23 (×2): 2 g via INTRAVENOUS
  Filled 2020-04-22 (×2): qty 100

## 2020-04-22 MED ORDER — METOCLOPRAMIDE HCL 5 MG/ML IJ SOLN: 5.0000 mg | Freq: Three times a day (TID) | INTRAMUSCULAR | Status: DC | PRN

## 2020-04-22 MED ORDER — CEFAZOLIN SODIUM-DEXTROSE 2-4 GM/100ML-% IV SOLN
2.0000 g | INTRAVENOUS | Status: AC
  Administered 2020-04-22: 2 g via INTRAVENOUS
  Filled 2020-04-22: qty 100

## 2020-04-22 MED ORDER — OXYCODONE HCL 5 MG PO TABS: 5.0000 mg | ORAL_TABLET | Freq: Once | ORAL | Status: DC | PRN

## 2020-04-22 MED ORDER — MORPHINE SULFATE (PF) 2 MG/ML IV SOLN: 0.5000 mg | INTRAVENOUS | Status: DC | PRN

## 2020-04-22 MED ORDER — BUPIVACAINE HCL 0.25 % IJ SOLN
INTRAMUSCULAR | Status: DC | PRN
  Administered 2020-04-22: 30 mL

## 2020-04-22 MED ORDER — PHENYLEPHRINE 40 MCG/ML (10ML) SYRINGE FOR IV PUSH (FOR BLOOD PRESSURE SUPPORT)
PREFILLED_SYRINGE | INTRAVENOUS | Status: DC | PRN
  Administered 2020-04-22: 120 ug via INTRAVENOUS
  Administered 2020-04-22: 160 ug via INTRAVENOUS
  Administered 2020-04-22 (×2): 120 ug via INTRAVENOUS

## 2020-04-22 MED ORDER — MORPHINE SULFATE (PF) 4 MG/ML IV SOLN: 0.5000 mg | INTRAVENOUS | Status: DC | PRN

## 2020-04-22 MED ORDER — TRANEXAMIC ACID-NACL 1000-0.7 MG/100ML-% IV SOLN
1000.0000 mg | INTRAVENOUS | Status: AC
  Administered 2020-04-22: 1000 mg via INTRAVENOUS

## 2020-04-22 MED ORDER — TRAMADOL HCL 50 MG PO TABS: 50.0000 mg | ORAL_TABLET | Freq: Four times a day (QID) | ORAL | Status: DC | PRN

## 2020-04-22 MED ORDER — DOCUSATE SODIUM 100 MG PO CAPS
100.0000 mg | ORAL_CAPSULE | Freq: Two times a day (BID) | ORAL | Status: DC
  Administered 2020-04-22 – 2020-04-23 (×2): 100 mg via ORAL
  Filled 2020-04-22 (×2): qty 1

## 2020-04-22 MED ORDER — BUPIVACAINE HCL 0.25 % IJ SOLN
INTRAMUSCULAR | Status: AC
  Filled 2020-04-22: qty 1

## 2020-04-22 MED ORDER — PROMETHAZINE HCL 25 MG/ML IJ SOLN
6.2500 mg | INTRAMUSCULAR | Status: DC | PRN
Start: 2020-04-22 — End: 2020-04-22

## 2020-04-22 MED ORDER — ACETAMINOPHEN 325 MG PO TABS: 325.0000 mg | ORAL_TABLET | Freq: Four times a day (QID) | ORAL | Status: DC | PRN

## 2020-04-22 MED ORDER — PROPOFOL 10 MG/ML IV BOLUS
INTRAVENOUS | Status: AC
  Filled 2020-04-22: qty 20

## 2020-04-22 MED ORDER — PHENYLEPHRINE HCL-NACL 10-0.9 MG/250ML-% IV SOLN
INTRAVENOUS | Status: DC | PRN
  Administered 2020-04-22: 30 ug/min via INTRAVENOUS

## 2020-04-22 MED ORDER — PROPOFOL 500 MG/50ML IV EMUL
INTRAVENOUS | Status: DC | PRN
  Administered 2020-04-22: 100 ug/kg/min via INTRAVENOUS

## 2020-04-22 MED ORDER — PHENYLEPHRINE 40 MCG/ML (10ML) SYRINGE FOR IV PUSH (FOR BLOOD PRESSURE SUPPORT)
PREFILLED_SYRINGE | INTRAVENOUS | Status: AC
  Filled 2020-04-22: qty 10

## 2020-04-22 MED ORDER — POLYETHYLENE GLYCOL 3350 17 G PO PACK: 17.0000 g | PACK | Freq: Every day | ORAL | Status: DC | PRN

## 2020-04-22 MED ORDER — 0.9 % SODIUM CHLORIDE (POUR BTL) OPTIME
TOPICAL | Status: DC | PRN
  Administered 2020-04-22: 1000 mL

## 2020-04-22 MED ORDER — LACTATED RINGERS IV SOLN: INTRAVENOUS | Status: DC

## 2020-04-22 MED ORDER — PHENOL 1.4 % MT LIQD: 1.0000 | OROMUCOSAL | Status: DC | PRN

## 2020-04-22 SURGICAL SUPPLY — 44 items
BAG DECANTER FOR FLEXI CONT (MISCELLANEOUS) IMPLANT
BAG ZIPLOCK 12X15 (MISCELLANEOUS) IMPLANT
BALL HIP ARTICU EZE 36 8.5 (Hips) ×1 IMPLANT
BLADE SAG 18X100X1.27 (BLADE) ×2 IMPLANT
COVER PERINEAL POST (MISCELLANEOUS) ×2 IMPLANT
COVER SURGICAL LIGHT HANDLE (MISCELLANEOUS) ×2 IMPLANT
COVER WAND RF STERILE (DRAPES) ×2 IMPLANT
CUP ACET PINNACLE SECTR 56MM (Hips) ×1 IMPLANT
DECANTER SPIKE VIAL GLASS SM (MISCELLANEOUS) ×2 IMPLANT
DRAPE STERI IOBAN 125X83 (DRAPES) ×2 IMPLANT
DRAPE U-SHAPE 47X51 STRL (DRAPES) ×4 IMPLANT
DRSG AQUACEL AG ADV 3.5X10 (GAUZE/BANDAGES/DRESSINGS) ×2 IMPLANT
DURAPREP 26ML APPLICATOR (WOUND CARE) ×2 IMPLANT
ELECT REM PT RETURN 15FT ADLT (MISCELLANEOUS) ×2 IMPLANT
EVACUATOR 1/8 PVC DRAIN (DRAIN) IMPLANT
FEM STEM 12/14 TAPER SZ 4 HIP (Orthopedic Implant) ×2 IMPLANT
FEMORAL STEM 12/14 TPR SZ4 HIP (Orthopedic Implant) ×1 IMPLANT
GLOVE SRG 8 PF TXTR STRL LF DI (GLOVE) ×1 IMPLANT
GLOVE SURG ENC MOIS LTX SZ6 (GLOVE) IMPLANT
GLOVE SURG ENC MOIS LTX SZ7 (GLOVE) ×2 IMPLANT
GLOVE SURG ENC MOIS LTX SZ8 (GLOVE) ×2 IMPLANT
GLOVE SURG ENC TEXT LTX SZ7 (GLOVE) IMPLANT
GLOVE SURG UNDER POLY LF SZ6.5 (GLOVE) IMPLANT
GLOVE SURG UNDER POLY LF SZ8 (GLOVE) ×1
GLOVE SURG UNDER POLY LF SZ8.5 (GLOVE) ×2 IMPLANT
GOWN STRL REUS W/TWL LRG LVL3 (GOWN DISPOSABLE) ×4 IMPLANT
GOWN STRL REUS W/TWL XL LVL3 (GOWN DISPOSABLE) ×2 IMPLANT
HIP BALL ARTICU EZE 36 8.5 (Hips) ×2 IMPLANT
HOLDER FOLEY CATH W/STRAP (MISCELLANEOUS) ×2 IMPLANT
KIT TURNOVER KIT A (KITS) IMPLANT
LINER MARATHON 4 NEUTRAL 36X56 (Hips) ×2 IMPLANT
MANIFOLD NEPTUNE II (INSTRUMENTS) ×2 IMPLANT
PACK ANTERIOR HIP CUSTOM (KITS) ×2 IMPLANT
PENCIL SMOKE EVACUATOR COATED (MISCELLANEOUS) ×2 IMPLANT
PINNACLE SECTOR CUP 56MM (Hips) ×2 IMPLANT
STRIP CLOSURE SKIN 1/2X4 (GAUZE/BANDAGES/DRESSINGS) ×2 IMPLANT
SUT ETHIBOND NAB CT1 #1 30IN (SUTURE) ×2 IMPLANT
SUT MNCRL AB 4-0 PS2 18 (SUTURE) ×2 IMPLANT
SUT STRATAFIX 0 PDS 27 VIOLET (SUTURE) ×2
SUT VIC AB 2-0 CT1 27 (SUTURE) ×2
SUT VIC AB 2-0 CT1 TAPERPNT 27 (SUTURE) ×2 IMPLANT
SUTURE STRATFX 0 PDS 27 VIOLET (SUTURE) ×1 IMPLANT
SYR 50ML LL SCALE MARK (SYRINGE) IMPLANT
TRAY FOLEY MTR SLVR 16FR STAT (SET/KITS/TRAYS/PACK) ×2 IMPLANT

## 2020-04-22 NOTE — Discharge Instructions (Addendum)
Information on my medicine - XARELTO (Rivaroxaban)    Why was Xarelto prescribed for you? Xarelto was prescribed for you to reduce the risk of blood clots forming after orthopedic surgery. The medical term for these abnormal blood clots is venous thromboembolism (VTE).  What do you need to know about xarelto ? Take your Xarelto ONCE DAILY at the same time every day. You may take it either with or without food.  If you have difficulty swallowing the tablet whole, you may crush it and mix in applesauce just prior to taking your dose.  Take Xarelto exactly as prescribed by your doctor and DO NOT stop taking Xarelto without talking to the doctor who prescribed the medication.  Stopping without other VTE prevention medication to take the place of Xarelto may increase your risk of developing a clot.  After discharge, you should have regular check-up appointments with your healthcare provider that is prescribing your Xarelto.    What do you do if you miss a dose? If you miss a dose, take it as soon as you remember on the same day then continue your regularly scheduled once daily regimen the next day. Do not take two doses of Xarelto on the same day.   Important Safety Information A possible side effect of Xarelto is bleeding. You should call your healthcare provider right away if you experience any of the following: ? Bleeding from an injury or your nose that does not stop. ? Unusual colored urine (red or dark brown) or unusual colored stools (red or black). ? Unusual bruising for unknown reasons. ? A serious fall or if you hit your head (even if there is no bleeding).  Some medicines may interact with Xarelto and might increase your risk of bleeding while on Xarelto. To help avoid this, consult your healthcare provider or pharmacist prior to using any new prescription or non-prescription medications, including herbals, vitamins, non-steroidal anti-inflammatory drugs (NSAIDs) and  supplements.  This website has more information on Xarelto: [email or web address].[email or web address]   [name], MD Total Joint Specialist [facility] [place] [address], [address] [place], [place] [address] [phone]  ANTERIOR APPROACH TOTAL HIP REPLACEMENT POSTOPERATIVE DIRECTIONS     Hip Rehabilitation, Guidelines Following Surgery  The results of a hip operation are greatly improved after range of motion and muscle strengthening exercises. Follow all safety measures which are given to protect your hip. If any of these exercises cause increased pain or swelling in your joint, decrease the amount until you are comfortable again. Then slowly increase the exercises. Call your caregiver if you have problems or questions.   BLOOD CLOT PREVENTION  Take a 10 mg Xarelto once a day for three weeks following surgery. Then take an 81 mg Aspirin once a day for three weeks. Then discontinue Aspirin.  You may resume your vitamins/supplements once you have discontinued the Xarelto.  Do not take any NSAIDs (Advil, Aleve, Ibuprofen, Meloxicam, etc.) until you have discontinued the Xarelto.   HOME CARE INSTRUCTIONS   Remove items at home which could result in a fall. This includes throw rugs or furniture in walking pathways.   ICE to the affected hip as frequently as 20-30 minutes an hour and then as needed for pain and swelling. Continue to use ice on the hip for pain and swelling from surgery. You may notice swelling that will progress down to the foot and ankle. This is normal after surgery. Elevate the leg when you are not up walking on it.  Continue to use the breathing machine which will help keep your temperature down.  It is common for your temperature to cycle up and down following surgery, especially at night when you are not up moving around and exerting yourself.  The breathing machine keeps your lungs expanded and your temperature down.  DIET You may resume your previous home diet  once your are discharged from the hospital.  DRESSING / WOUND CARE / SHOWERING  You have an adhesive waterproof bandage over the incision. Leave this in place until your first follow-up appointment. Once you remove this you will not need to place another bandage.   You may begin showering 3 days following surgery, but do not submerge the incision under water.  ACTIVITY  For the first 3-5 days, it is important to rest and keep the operative leg elevated. You should, as a general rule, rest for 50 minutes and walk/stretch for 10 minutes per hour. After 5 days, you may slowly increase activity as tolerated.   Perform the exercises you were provided twice a day for about 15-20 minutes each session. Begin these 2 days following surgery.  Walk with your walker as instructed. Use the walker until you are comfortable transitioning to a cane. Walk with the cane in the opposite hand of the operative leg. You may discontinue the cane once you are comfortable and walking steadily.  Avoid periods of inactivity such as sitting longer than an hour when not asleep. This helps prevent blood clots.   Do not drive a car for 6 weeks or until released by your surgeon.   Do not drive while taking narcotics.  TED HOSE STOCKINGS Wear the elastic stockings on both legs for three weeks following surgery during the day. You may remove them at night while sleeping.  WEIGHT BEARING Weight bearing as tolerated with assist device (walker, cane, etc) as directed, use it as long as suggested by your surgeon or therapist, typically at least 4-6 weeks.  POSTOPERATIVE CONSTIPATION PROTOCOL Constipation - defined medically as fewer than three stools per week and severe constipation as less than one stool per week.  One of the most common issues patients have following surgery is constipation.  Even if you have a regular bowel pattern at home, your normal regimen is likely to be disrupted due to multiple reasons following  surgery.  Combination of anesthesia, postoperative narcotics, change in appetite and fluid intake all can affect your bowels.  In order to avoid complications following surgery, here are some recommendations in order to help you during your recovery period.   Colace (docusate) - Pick up an over-the-counter form of Colace or another stool softener and take twice a day as long as you are requiring postoperative pain medications.  Take with a full glass of water daily.  If you experience loose stools or diarrhea, hold the colace until you stool forms back up.  If your symptoms do not get better within 1 week or if they get worse, check with your doctor.  Dulcolax (bisacodyl) - Pick up over-the-counter and take as directed by the product packaging as needed to assist with the movement of your bowels.  Take with a full glass of water.  Use this product as needed if not relieved by Colace only.   MiraLax (polyethylene glycol) - Pick up over-the-counter to have on hand.  MiraLax is a solution that will increase the amount of water in your bowels to assist with bowel movements.  Take as directed and can  mix with a glass of water, juice, soda, coffee, or tea.  Take if you go more than two days without a movement.Do not use MiraLax more than once per day. Call your doctor if you are still constipated or irregular after using this medication for 7 days in a row.  If you continue to have problems with postoperative constipation, please contact the office for further assistance and recommendations.  If you experience "the worst abdominal pain ever" or develop nausea or vomiting, please contact the office immediatly for further recommendations for treatment.  ITCHING  If you experience itching with your medications, try taking only a single pain pill, or even half a pain pill at a time.  You can also use Benadryl over the counter for itching or also to help with sleep.   MEDICATIONS See your medication summary on  the After Visit Summary that the nursing staff will review with you prior to discharge.  You may have some home medications which will be placed on hold until you complete the course of blood thinner medication.  It is important for you to complete the blood thinner medication as prescribed by your surgeon.  Continue your approved medications as instructed at time of discharge.  PRECAUTIONS If you experience chest pain or shortness of breath - call 911 immediately for transfer to the hospital emergency department.  If you develop a fever greater that 101 F, purulent drainage from wound, increased redness or drainage from wound, foul odor from the wound/dressing, or calf pain - CONTACT YOUR SURGEON.                                                   FOLLOW-UP APPOINTMENTS Make sure you keep all of your appointments after your operation with your surgeon and caregivers. You should call the office at the above phone number and make an appointment for approximately two weeks after the date of your surgery or on the date instructed by your surgeon outlined in the "After Visit Summary".  RANGE OF MOTION AND STRENGTHENING EXERCISES  These exercises are designed to help you keep full movement of your hip joint. Follow your caregiver's or physical therapist's instructions. Perform all exercises about fifteen times, three times per day or as directed. Exercise both hips, even if you have had only one joint replacement. These exercises can be done on a training (exercise) mat, on the floor, on a table or on a bed. Use whatever works the best and is most comfortable for you. Use music or television while you are exercising so that the exercises are a pleasant break in your day. This will make your life better with the exercises acting as a break in routine you can look forward to.   Lying on your back, slowly slide your foot toward your buttocks, raising your knee up off the floor. Then slowly slide your foot back  down until your leg is straight again.   Lying on your back spread your legs as far apart as you can without causing discomfort.   Lying on your side, raise your upper leg and foot straight up from the floor as far as is comfortable. Slowly lower the leg and repeat.   Lying on your back, tighten up the muscle in the front of your thigh (quadriceps muscles). You can do this by keeping your  leg straight and trying to raise your heel off the floor. This helps strengthen the largest muscle supporting your knee.   Lying on your back, tighten up the muscles of your buttocks both with the legs straight and with the knee bent at a comfortable angle while keeping your heel on the floor.   IF YOU ARE TRANSFERRED TO A SKILLED REHAB FACILITY If the patient is transferred to a skilled rehab facility following release from the hospital, a list of the current medications will be sent to the facility for the patient to continue.  When discharged from the skilled rehab facility, please have the facility set up the patient's [place] prior to being released. Also, the skilled facility will be responsible for providing the patient with their medications at time of release from the facility to include their pain medication, the muscle relaxants, and their blood thinner medication. If the patient is still at the rehab facility at time of the two week follow up appointment, the skilled rehab facility will also need to assist the patient in arranging follow up appointment in our office and any transportation needs.  MAKE SURE YOU:   Understand these instructions.   Get help right away if you are not doing well or get worse.    DENTAL ANTIBIOTICS:  In most cases prophylactic antibiotics for Dental procdeures after total joint surgery are not necessary.  Exceptions are as follows:  1. History of prior total joint infection  2. Severely immunocompromised (Organ Transplant, cancer chemotherapy,  Rheumatoid biologic meds such as Keene)  3. Poorly controlled diabetes (A1C &gt; 8.0, blood glucose over 200)  If you have one of these conditions, contact your surgeon for an antibiotic prescription, prior to your dental procedure.    Pick up stool softner and laxative for home use following surgery while on pain medications. Do not submerge incision under water. Please use good hand washing techniques while changing dressing each day. May shower starting three days after surgery. Please use a clean towel to pat the incision dry following showers. Continue to use ice for pain and swelling after surgery. Do not use any lotions or creams on the incision until instructed by your surgeon.

## 2020-04-22 NOTE — Interval H&P Note (Signed)
History and Physical Interval Note:  04/22/2020 1:00 PM  Samuel Matthews  has presented today for surgery, with the diagnosis of right hip osteoarthritis.  The various methods of treatment have been discussed with the patient and family. After consideration of risks, benefits and other options for treatment, the patient has consented to  Procedure(s) with comments: Souris (Right) - 181min as a surgical intervention.  The patient's history has been reviewed, patient examined, no change in status, stable for surgery.  I have reviewed the patient's chart and labs.  Questions were answered to the patient's satisfaction.     Pilar Plate Arleta Ostrum

## 2020-04-22 NOTE — Anesthesia Procedure Notes (Signed)
Spinal  Patient location during procedure: OR Start time: 04/22/2020 2:19 PM End time: 04/22/2020 2:24 PM Staffing Performed: resident/CRNA  Resident/CRNA: Niel Hummer, CRNA Preanesthetic Checklist Completed: patient identified, IV checked, risks and benefits discussed, surgical consent, monitors and equipment checked and pre-op evaluation Spinal Block Patient position: sitting Prep: DuraPrep Patient monitoring: heart rate, continuous pulse ox and blood pressure Approach: midline Location: L3-4 Injection technique: single-shot Needle Needle type: Whitacre  Needle gauge: 22 G Needle length: 9 cm Assessment Sensory level: T6 Additional Notes Tolerated well.

## 2020-04-22 NOTE — Transfer of Care (Signed)
Immediate Anesthesia Transfer of Care Note  Patient: Samuel Matthews  Procedure(s) Performed: TOTAL HIP ARTHROPLASTY ANTERIOR APPROACH (Right Hip)  Patient Location: PACU  Anesthesia Type:Spinal  Level of Consciousness: awake  Airway & Oxygen Therapy: Patient Spontanous Breathing and Patient connected to face mask oxygen  Post-op Assessment: Report given to RN and Post -op Vital signs reviewed and stable  Post vital signs: Reviewed and stable  Last Vitals:  Vitals Value Taken Time  BP    Temp    Pulse    Resp 16 04/22/20 1606  SpO2    Vitals shown include unvalidated device data.  Last Pain:  Vitals:   04/22/20 1156  TempSrc: Oral         Complications: No complications documented.

## 2020-04-22 NOTE — Anesthesia Postprocedure Evaluation (Signed)
Anesthesia Post Note  Patient: VIC ESCO  Procedure(s) Performed: TOTAL HIP ARTHROPLASTY ANTERIOR APPROACH (Right Hip)     Patient location during evaluation: PACU Anesthesia Type: Spinal Level of consciousness: awake and alert Pain management: pain level controlled Vital Signs Assessment: post-procedure vital signs reviewed and stable Respiratory status: spontaneous breathing, nonlabored ventilation and respiratory function stable Cardiovascular status: blood pressure returned to baseline and stable Postop Assessment: no apparent nausea or vomiting Anesthetic complications: no   No complications documented.  Last Vitals:  Vitals:   04/22/20 1645 04/22/20 1700  BP: 101/63 (!) 107/58  Pulse: (!) 56 (!) 54  Resp: 14 13  Temp:    SpO2: 94% 96%    Last Pain:  Vitals:   04/22/20 1156  TempSrc: Oral                 Lynda Rainwater

## 2020-04-22 NOTE — Plan of Care (Signed)
  Problem: Education: Goal: Knowledge of General Education information will improve Description: Including pain rating scale, medication(s)/side effects and non-pharmacologic comfort measures Outcome: Progressing   Problem: Clinical Measurements: Goal: Diagnostic test results will improve Outcome: Progressing   Problem: Activity: Goal: Risk for activity intolerance will decrease Outcome: Progressing   

## 2020-04-22 NOTE — Anesthesia Preprocedure Evaluation (Signed)
Anesthesia Evaluation  Patient identified by MRN, date of birth, ID band Patient awake    Reviewed: Allergy & Precautions, H&P , NPO status , Patient's Chart, lab work & pertinent test results  History of Anesthesia Complications Negative for: history of anesthetic complications  Airway Mallampati: I   Neck ROM: Full    Dental  (+) Teeth Intact   Pulmonary neg pulmonary ROS,    breath sounds clear to auscultation       Cardiovascular  Rhythm:Regular Rate:Normal     Neuro/Psych    GI/Hepatic negative GI ROS, Neg liver ROS,   Endo/Other    Renal/GU negative Renal ROS     Musculoskeletal  (+) Arthritis , Osteoarthritis,    Abdominal   Peds  Hematology   Anesthesia Other Findings   Reproductive/Obstetrics                             Anesthesia Physical  Anesthesia Plan  ASA: II  Anesthesia Plan: Spinal   Post-op Pain Management:    Induction: Intravenous  PONV Risk Score and Plan: 1 and Ondansetron and Treatment may vary due to age or medical condition  Airway Management Planned: Natural Airway and Simple Face Mask  Additional Equipment:   Intra-op Plan:   Post-operative Plan:   Informed Consent: I have reviewed the patients History and Physical, chart, labs and discussed the procedure including the risks, benefits and alternatives for the proposed anesthesia with the patient or authorized representative who has indicated his/her understanding and acceptance.       Plan Discussed with: CRNA and Surgeon  Anesthesia Plan Comments:         Anesthesia Quick Evaluation

## 2020-04-22 NOTE — Care Plan (Signed)
Ortho Bundle Case Management Note  Patient Details  Name: SUTTON HIRSCH MRN: [id number] Date of Birth: 15-Dec-1940  R THA on 04-22-20 DCP:  Home with wife.  2 story home with 3 ste. MBR on the main level. DME:  No needs.  Has a RW and 3-in-1 PT:  HEP                  DME Arranged:  N/A DME Agency:  NA  HH Arranged:  NA HH Agency:  NA  Additional Comments: Please contact me with any questions of if this plan should need to change.  Marianne Sofia, RN,CCM [facility]  [phone] 04/22/2020, 12:00 PM

## 2020-04-22 NOTE — Anesthesia Procedure Notes (Signed)
Procedure Name: MAC Date/Time: 04/22/2020 2:15 PM Performed by: Niel Hummer, CRNA Pre-anesthesia Checklist: Patient identified, Emergency Drugs available, Suction available and Patient being monitored Oxygen Delivery Method: Simple face mask

## 2020-04-22 NOTE — Op Note (Signed)
OPERATIVE REPORT- TOTAL HIP ARTHROPLASTY   PREOPERATIVE DIAGNOSIS: Osteoarthritis of the Right hip.   POSTOPERATIVE DIAGNOSIS: Osteoarthritis of the Right  hip.   PROCEDURE: Right total hip arthroplasty, anterior approach.   SURGEON: [name], MD   ASSISTANT: Desmond Dike, PA-C  ANESTHESIA:  Spinal  ESTIMATED BLOOD LOSS:-650 mL    DRAINS: Hemovac x1.   COMPLICATIONS: None   CONDITION: PACU - hemodynamically stable.   BRIEF CLINICAL NOTE: Samuel Matthews is a 80 y.o. male who has advanced end-  stage arthritis of their Right  hip with progressively worsening pain and  dysfunction.The patient has failed nonoperative management and presents for  total hip arthroplasty.   PROCEDURE IN DETAIL: After successful administration of spinal  anesthetic, the traction boots for the [facility] bed were placed on both  feet and the patient was placed onto the [facility] bed, boots placed into the leg  holders. The Right hip was then isolated from the perineum with plastic  drapes and prepped and draped in the usual sterile fashion. ASIS and  greater trochanter were marked and a oblique incision was made, starting  at about 1 cm lateral and 2 cm distal to the ASIS and coursing towards  the anterior cortex of the femur. The skin was cut with a 10 blade  through subcutaneous tissue to the level of the fascia overlying the  tensor fascia lata muscle. The fascia was then incised in line with the  incision at the junction of the anterior third and posterior 2/3rd. The  muscle was teased off the fascia and then the interval between the TFL  and the rectus was developed. The Hohmann retractor was then placed at  the top of the femoral neck over the capsule. The vessels overlying the  capsule were cauterized and the fat on top of the capsule was removed.  A Hohmann retractor was then placed anterior underneath the rectus  femoris to give exposure to the entire anterior capsule. A T-shaped   capsulotomy was performed. The edges were tagged and the femoral head  was identified.       Osteophytes are removed off the superior acetabulum.  The femoral neck was then cut in situ with an oscillating saw. Traction  was then applied to the left lower extremity utilizing the [facility]  traction. The femoral head was then removed. Retractors were placed  around the acetabulum and then circumferential removal of the labrum was  performed. Osteophytes were also removed. Reaming starts at 49 mm to  medialize and  Increased in 2 mm increments to 55 mm. We reamed in  approximately 40 degrees of abduction, 20 degrees anteversion. A 56 mm  pinnacle acetabular shell was then impacted in anatomic position under  fluoroscopic guidance with excellent purchase. We did not need to place  any additional dome screws. A 36 mm neutral + 4 marathon liner was then  placed into the acetabular shell.       The femoral lift was then placed along the lateral aspect of the femur  just distal to the vastus ridge. The leg was  externally rotated and capsule  was stripped off the inferior aspect of the femoral neck down to the  level of the lesser trochanter, this was done with electrocautery. The femur was lifted after this was performed. The  leg was then placed in an extended and adducted position essentially delivering the femur. We also removed the capsule superiorly and the piriformis from the piriformis  fossa to gain excellent exposure of the  proximal femur. Rongeur was used to remove some cancellous bone to get  into the lateral portion of the proximal femur for placement of the  initial starter reamer. The starter broaches was placed  the starter broach  and was shown to go down the center of the canal. Broaching  with the Actis system was then performed starting at size 0  coursing  Up to size 4. A size 4 had excellent torsional and rotational  and axial stability. The trial high offset neck was then placed   with a 36 + 8.5 trial head. The hip was then reduced. We confirmed that  the stem was in the canal both on AP and lateral x-rays. It also has excellent sizing. The hip was reduced with outstanding stability through full extension and full external rotation.. AP pelvis was taken and the leg lengths were measured and found to be equal. Hip was then dislocated again and the femoral head and neck removed. The  femoral broach was removed. Size 4 Actis stem with a hgh offset  neck was then impacted into the femur following native anteversion. Has  excellent purchase in the canal. Excellent torsional and rotational and  axial stability. It is confirmed to be in the canal on AP and lateral  fluoroscopic views. The 36 + 8.5 metal head was placed and the hip  reduced with outstanding stability. Again AP pelvis was taken and it  confirmed that the leg lengths were equal. The wound was then copiously  irrigated with saline solution and the capsule reattached and repaired  with Ethibond suture. 30 ml of .25% Bupivicaine was  injected into the capsule and into the edge of the tensor fascia lata as well as subcutaneous tissue. The fascia overlying the tensor fascia lata was then closed with a running #1 V-Loc. Subcu was closed with interrupted 2-0 Vicryl and subcuticular running 4-0 Monocryl. Incision was cleaned  and dried. Steri-Strips and a bulky sterile dressing applied. Hemovac  drain was hooked to suction and then the patient was awakened and transported to  recovery in stable condition.        Please note that a surgical assistant was a medical necessity for this procedure to perform it in a safe and expeditious manner. Assistant was necessary to provide appropriate retraction of vital neurovascular structures and to prevent femoral fracture and allow for anatomic placement of the prosthesis.  [name], M.D.

## 2020-04-23 ENCOUNTER — Encounter (HOSPITAL_COMMUNITY): Payer: Self-pay | Admitting: Orthopedic Surgery

## 2020-04-23 DIAGNOSIS — Z85828 Personal history of other malignant neoplasm of skin: Secondary | ICD-10-CM | POA: Diagnosis not present

## 2020-04-23 DIAGNOSIS — M1611 Unilateral primary osteoarthritis, right hip: Secondary | ICD-10-CM | POA: Diagnosis not present

## 2020-04-23 DIAGNOSIS — Z96642 Presence of left artificial hip joint: Secondary | ICD-10-CM | POA: Diagnosis not present

## 2020-04-23 LAB — BASIC METABOLIC PANEL
Anion gap: 8 (ref 5–15)
BUN: 22 mg/dL (ref 8–23)
CO2: 22 mmol/L (ref 22–32)
Calcium: 8.6 mg/dL — ABNORMAL LOW (ref 8.9–10.3)
Chloride: 106 mmol/L (ref 98–111)
Creatinine, Ser: 1.14 mg/dL (ref 0.61–1.24)
GFR, Estimated: >60 mL/min (ref >60)
Glucose, Bld: 134 mg/dL — ABNORMAL HIGH (ref 70–99)
Potassium: 4.3 mmol/L (ref 3.5–5.1)
Sodium: 136 mmol/L (ref 135–145)

## 2020-04-23 LAB — CBC
HCT: 33.3 % — ABNORMAL LOW (ref 39.0–52.0)
Hemoglobin: 11.5 g/dL — ABNORMAL LOW (ref 13.0–17.0)
MCH: 32 pg (ref 26.0–34.0)
MCHC: 34.5 g/dL (ref 30.0–36.0)
MCV: 92.8 fL (ref 80.0–100.0)
Platelets: 181 10*3/uL (ref 150–400)
RBC: 3.59 MIL/uL — ABNORMAL LOW (ref 4.22–5.81)
RDW: 12.1 % (ref 11.5–15.5)
WBC: 13.2 10*3/uL — ABNORMAL HIGH (ref 4.0–10.5)
nRBC: 0 % (ref 0.0–0.2)

## 2020-04-23 MED ORDER — HYDROCODONE-ACETAMINOPHEN 5-325 MG PO TABS: 1.0000 | ORAL_TABLET | ORAL | 0 refills | Status: DC | PRN

## 2020-04-23 MED ORDER — RIVAROXABAN 10 MG PO TABS: 10.0000 mg | ORAL_TABLET | Freq: Every day | ORAL | 0 refills | Status: DC

## 2020-04-23 MED ORDER — TRAMADOL HCL 50 MG PO TABS: 50.0000 mg | ORAL_TABLET | Freq: Four times a day (QID) | ORAL | 0 refills | Status: DC | PRN

## 2020-04-23 MED ORDER — METHOCARBAMOL 500 MG PO TABS: 500.0000 mg | ORAL_TABLET | Freq: Four times a day (QID) | ORAL | 0 refills | Status: DC | PRN

## 2020-04-23 NOTE — Evaluation (Signed)
Physical Therapy Evaluation Patient Details Name: Samuel Matthews MRN: [id number] DOB: May 20, 1940 Today's Date: 04/23/2020   History of Present Illness  Pt is an 80 year old male s/p right direct anterior THA with hx of L direct anterior THA  Clinical Impression  Patient evaluated by Physical Therapy with no further acute PT needs identified. All education has been completed and the patient has no further questions.  Pt ambulated in hallway, practiced safe stair technique and performed LE exercises.  Pt has HEP handout.  Pt reports his daughter is a physical therapist at [facility] [employer].  Pt feels ready for d/c home today. See below for any follow-up Physical Therapy or equipment needs. PT is signing off. Thank you for this referral.     Follow Up Recommendations Follow surgeon's recommendation for DC plan and follow-up therapies (plan for HEP)    Equipment Recommendations  None recommended by PT    Recommendations for Other Services       Precautions / Restrictions Precautions Precautions: Fall Restrictions Weight Bearing Restrictions: No      Mobility  Bed Mobility Overal bed mobility: Needs Assistance Bed Mobility: Supine to Sit     Supine to sit: Supervision          Transfers Overall transfer level: Needs assistance Equipment used: Rolling walker (2 wheeled) Transfers: Sit to/from Stand Sit to Stand: Min guard;Supervision         General transfer comment: cues for hand placement  Ambulation/Gait Ambulation/Gait assistance: Min guard;Supervision Gait Distance (Feet): 340 Feet Assistive device: Rolling walker (2 wheeled) Gait Pattern/deviations: Step-through pattern;Decreased stance time - right;Antalgic     General Gait Details: verbal cues for sequence and posture, pt reports very minimal pain  Stairs Stairs: Yes Stairs assistance: Min guard Stair Management: Step to pattern;Backwards;With walker Number of Stairs: 3 General stair comments: verbal cues  for sequence and safety, pt also performed forwards with rail and cane; pt had no further questions  Wheelchair Mobility    Modified Rankin (Stroke Patients Only)       Balance                                             Pertinent Vitals/Pain Pain Assessment: 0-10 Pain Score: 3  Pain Location: right hip Pain Descriptors / Indicators: Sore;Aching Pain Intervention(s): Monitored during session;Repositioned    Home Living Family/patient expects to be discharged to:: Private residence Living Arrangements: Spouse/significant other Available Help at Discharge: Family Type of Home: House Home Access: Stairs to enter Entrance Stairs-Rails: None Entrance Stairs-Number of Steps: 3 Home Layout: Able to live on main level with bedroom/bathroom;Two level Home Equipment: Walker - 2 wheels;Bedside commode      Prior Function Level of Independence: Independent               Hand Dominance        Extremity/Trunk Assessment        Lower Extremity Assessment Lower Extremity Assessment: RLE deficits/detail RLE Deficits / Details: anticipated post op hip weakness, hip grossly 3+/5       Communication   Communication: No difficulties  Cognition Arousal/Alertness: Awake/alert Behavior During Therapy: WFL for tasks assessed/performed Overall Cognitive Status: Within Functional Limits for tasks assessed  General Comments      Exercises Total Joint Exercises Ankle Circles/Pumps: AROM;10 reps;Both Quad Sets: AROM;Both;10 reps Gluteal Sets: AROM;Both;10 reps Heel Slides: AROM;Right;10 reps Hip ABduction/ADduction: AROM;Supine;Standing;Right;10 reps Long Arc Quad: AROM;Right;Seated;10 reps Knee Flexion: AROM;Right;Standing;10 reps Marching in Standing: AROM;Right;Standing;10 reps Standing Hip Extension: AROM;Right;Standing;10 reps (all standing exercises performed with UE support)    Assessment/Plan    PT Assessment Patent does not need any further PT services  PT Problem List Decreased strength;Decreased knowledge of use of DME;Pain;Decreased mobility       PT Treatment Interventions      PT Goals (Current goals can be found in the Care Plan section)  Acute Rehab PT Goals PT Goal Formulation: All assessment and education complete, DC therapy    Frequency     Barriers to discharge        Co-evaluation               AM-PAC PT "6 Clicks" Mobility  Outcome Measure Help needed turning from your back to your side while in a flat bed without using bedrails?: None Help needed moving from lying on your back to sitting on the side of a flat bed without using bedrails?: None Help needed moving to and from a bed to a chair (including a wheelchair)?: A Little Help needed standing up from a chair using your arms (e.g., wheelchair or bedside chair)?: A Little Help needed to walk in hospital room?: A Little Help needed climbing 3-5 steps with a railing? : A Little 6 Click Score: 20    End of Session Equipment Utilized During Treatment: Gait belt Activity Tolerance: Patient tolerated treatment well Patient left: in chair;with call bell/phone within reach Nurse Communication: Mobility status PT Visit Diagnosis: Difficulty in walking, not elsewhere classified (R26.2)    Time: 5686-1683 PT Time Calculation (min) (ACUTE ONLY): 31 min   Charges:   PT Evaluation $PT Eval Low Complexity: 1 Low PT Treatments $Therapeutic Exercise: 8-22 mins   Jannette Spanner PT, DPT Acute Rehabilitation Services Pager: [phone] Office: [phone]   York Ram E 04/23/2020, 12:09 PM

## 2020-04-23 NOTE — Plan of Care (Signed)
Patient discharged home in stable condition 

## 2020-04-23 NOTE — TOC Transition Note (Signed)
Transition of Care [facility]) - CM/SW Discharge Note   Patient Details  Name: Samuel Matthews MRN: [id number] Date of Birth: February 14, 1941  [facility] CM/SW Contact:  Lennart Pall, LCSW Phone Number: 04/23/2020, 11:38 AM   Clinical Narrative:     Met briefly with this patient who is [facility].  Confirmed he has all needed DME and plan for HEP.  No TOC needs.        Patient Goals and CMS Choice        Discharge Placement                       Discharge Plan and Services                DME Arranged: N/A DME Agency: NA       HH Arranged: NA HH Agency: NA        Social Determinants of Health (SDOH) Interventions     Readmission Risk Interventions No flowsheet data found.

## 2020-04-23 NOTE — Progress Notes (Signed)
Subjective: 1 Day Post-Op Procedure(s) (LRB): TOTAL HIP ARTHROPLASTY ANTERIOR APPROACH (Right) Patient reports pain as mild.   Patient seen in rounds by Dr. Wynelle Link. Patient is well, and has had no acute complaints or problems We will continue therapy today.   Objective: Vital signs in last 24 hours: Temp:  [97.6 F (36.4 C)-98.6 F (37 C)] 98.2 F (36.8 C) (02/08 0450) Pulse Rate:  [54-77] 64 (02/08 0450) Resp:  [13-17] 16 (02/08 0450) BP: (86-136)/(48-90) 115/60 (02/08 0450) SpO2:  [93 %-100 %] 96 % (02/08 0450) Weight:  [88.5 kg] 88.5 kg (02/07 1801)  Intake/Output from previous day:  Intake/Output Summary (Last 24 hours) at 04/23/2020 0746 Last data filed at 04/23/2020 0634 Gross per 24 hour  Intake 2308.75 ml  Output 3500 ml  Net -1191.25 ml     Intake/Output this shift: No intake/output data recorded.  Labs: Recent Labs    04/23/20 0313  HGB 11.5*   Recent Labs    04/23/20 0313  WBC 13.2*  RBC 3.59*  HCT 33.3*  PLT 181   Recent Labs    04/23/20 0313  NA 136  K 4.3  CL 106  CO2 22  BUN 22  CREATININE 1.14  GLUCOSE 134*  CALCIUM 8.6*   No results for input(s): LABPT, INR in the last 72 hours.  Exam: General - Patient is Alert and Oriented Extremity - Neurologically intact Neurovascular intact Sensation intact distally Dorsiflexion/Plantar flexion intact Dressing - dressing C/D/I Motor Function - intact, moving foot and toes well on exam.   Past Medical History:  Diagnosis Date  . Actinic keratosis   . Basal cell carcinoma 12/07/2016   L nasal dorsum inferior  . Basal cell carcinoma 11/25/2015   L postauricular area  . Basal cell carcinoma 05/06/2015   R chest, L neck  . Basal cell carcinoma 10/30/2014   L ant shoulder  . Basal cell carcinoma 02/06/2020   L med posterior shoulder, EDC  . Basal cell carcinoma of nose 2015   ([name])  . Cancer (HCC)    Hx: of squamous cell left calf  . Complication of anesthesia     DR Nicole Kindred removed melanoma used lidocaine next day lightheaded, blood pressure low- ER- adm for 3 days due to hypotensiion - ? lidocaine- melanoma removed at Dr Les Pou office- [place] in [place]   . Diverticulosis 2005   by colonoscopy  . History of chicken pox   . 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 History of pneumothorax 1982   rib fracture, from a fall  . 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 Lower back pain 2012   thought HNP, improved with PT  . Osteoarthritis of left hip   . Pneumothorax, acute    Hx: of  . 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   Weight as of this encounter: 88.5 kg. Advance diet Up with therapy D/C IV fluids  DVT Prophylaxis - Xarelto Weight bearing as tolerated. Begin therapy.  Plan is to go Home after hospital stay. Plan for discharge after one session of PT if meeting goals. HEP. Follow-up in the office February 22nd.  The PDMP database was reviewed today prior to any opioid medications being prescribed to this patient.  Theresa Duty, PA-C Orthopedic Surgery [phone] 04/23/2020, 7:46 AM

## 2020-04-29 NOTE — Discharge Summary (Signed)
Physician Discharge Summary   Patient ID: Samuel Matthews MRN: [id number] DOB/AGE: 06/11/1940 80 y.o.  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   Weight as of this encounter: 88.5 kg.  Procedure:  Procedure(s) (LRB): TOTAL HIP ARTHROPLASTY ANTERIOR APPROACH (Right)   Consults: None  HPI: Samuel Matthews is a  80 y.o. male who has advanced end-stage arthritis of their Right  hip with progressively worsening pain and dysfunction.The patient has failed nonoperative management and presents for total hip arthroplasty.   Laboratory Data: Admission on 04/22/2020, Discharged on 04/23/2020  Component Date Value Ref Range Status   WBC 04/23/2020 13.2* 4.0 - 10.5 K/uL Final   RBC 04/23/2020 3.59* 4.22 - 5.81 MIL/uL Final   Hemoglobin 04/23/2020 11.5* 13.0 - 17.0 g/dL Final   HCT 04/23/2020 33.3* 39.0 - 52.0 % Final   MCV 04/23/2020 92.8  80.0 - 100.0 fL Final   MCH 04/23/2020 32.0  26.0 - 34.0 pg Final   MCHC 04/23/2020 34.5  30.0 - 36.0 g/dL Final   RDW 04/23/2020 12.1  11.5 - 15.5 % Final   Platelets 04/23/2020 181  150 - 400 K/uL Final   nRBC 04/23/2020 0.0  0.0 - 0.2 % Final   Performed at [facility], [place] [address] [place], [place] [address]   Sodium 04/23/2020 136  135 - 145 mmol/L Final   Potassium 04/23/2020 4.3  3.5 - 5.1 mmol/L Final   Chloride 04/23/2020 106  98 - 111 mmol/L Final   CO2 04/23/2020 22  22 - 32 mmol/L Final   Glucose, Bld 04/23/2020 134* 70 - 99 mg/dL Final   Glucose reference range  applies only to samples taken after fasting for at least 8 hours.   BUN 04/23/2020 22  8 - 23 mg/dL Final   Creatinine, Ser 04/23/2020 1.14  0.61 - 1.24 mg/dL Final   Calcium 04/23/2020 8.6* 8.9 - 10.3 mg/dL Final   GFR, Estimated 04/23/2020 >60  >60 mL/min Final   Comment: (NOTE) Calculated using the CKD-EPI Creatinine Equation (2021)    Anion gap 04/23/2020 8  5 - 15 Final   Performed at [facility], [place] [address], [place], [place] [address]  Hospital Outpatient Visit on 04/18/2020  Component Date Value Ref Range Status   SARS Coronavirus 2 04/18/2020 NEGATIVE  NEGATIVE Final   Comment: (NOTE) SARS-CoV-2 target nucleic acids are NOT DETECTED.  The SARS-CoV-2 RNA is generally detectable in upper and lower respiratory specimens  during the acute phase of infection. Negative results do not preclude SARS-CoV-2 infection, do not rule out co-infections with other pathogens, and should not be used as the sole basis for treatment or other patient management decisions. Negative results must be combined with clinical observations, patient history, and epidemiological information. The expected result is Negative.  Fact Sheet for Patients: SugarRoll.be  Fact Sheet for Healthcare Providers: https://www.woods-mathews.com/  This test is not yet approved or cleared by the [place] FDA and  has been authorized for detection and/or diagnosis of SARS-CoV-2 by FDA under an Emergency Use Authorization (EUA). This EUA will remain  in effect (meaning this test can be used) for the duration of the COVID-19 declaration under Se                          ction 564(b)(1) of the Act, 21 U.S.C. section 360bbb-3(b)(1), unless the authorization is terminated or revoked sooner.  Performed at [facility], [place] [address], [place], [place] [address]   Hospital Outpatient Visit on 04/10/2020  Component Date Value Ref Range Status   MRSA, PCR 04/10/2020 NEGATIVE  NEGATIVE Final   Staphylococcus aureus 04/10/2020 NEGATIVE  NEGATIVE Final   Comment: (NOTE) The Xpert SA Assay (FDA approved for NASAL specimens in patients 42 years of age and older), is one component of a comprehensive surveillance program. It is not intended to diagnose infection nor to guide or monitor treatment. Performed at [facility], [place] [address] [place], [place] [address]    WBC 04/10/2020 6.5  4.0 - 10.5 K/uL Final   RBC 04/10/2020 4.36  4.22 - 5.81 MIL/uL Final   Hemoglobin 04/10/2020 14.0  13.0 - 17.0 g/dL Final   HCT 04/10/2020 41.2  39.0 - 52.0 % Final   MCV 04/10/2020 94.5  80.0 - 100.0 fL Final   MCH 04/10/2020 32.1  26.0 - 34.0 pg Final   MCHC 04/10/2020 34.0  30.0 - 36.0  g/dL Final   RDW 04/10/2020 11.9  11.5 - 15.5 % Final   Platelets 04/10/2020 207  150 - 400 K/uL Final   nRBC 04/10/2020 0.0  0.0 - 0.2 % Final   Performed at [facility], [place] [address] [place], [place] [address]   Sodium 04/10/2020 140  135 - 145 mmol/L Final   Potassium 04/10/2020 4.8  3.5 - 5.1 mmol/L Final   Chloride 04/10/2020 106  98 - 111 mmol/L Final   CO2 04/10/2020 26  22 - 32 mmol/L Final   Glucose, Bld 04/10/2020 105* 70 - 99 mg/dL Final   Glucose reference range applies only to samples taken after  fasting for at least 8 hours.   BUN 04/10/2020 18  8 - 23 mg/dL Final   Creatinine, Ser 04/10/2020 1.06  0.61 - 1.24 mg/dL Final   Calcium 04/10/2020 9.3  8.9 - 10.3 mg/dL Final   Total Protein 04/10/2020 6.4* 6.5 - 8.1 g/dL Final   Albumin 04/10/2020 3.9  3.5 - 5.0 g/dL Final   AST 04/10/2020 18  15 - 41 U/L Final   ALT 04/10/2020 17  0 - 44 U/L Final   Alkaline Phosphatase 04/10/2020 30* 38 - 126 U/L Final   Total Bilirubin 04/10/2020 0.6  0.3 - 1.2 mg/dL Final   GFR, Estimated 04/10/2020 >60  >60 mL/min Final   Comment: (NOTE) Calculated using the CKD-EPI Creatinine Equation (2021)    Anion gap 04/10/2020 8  5 - 15 Final   Performed at [facility], [place] [address], [place], [place] [address]   Prothrombin Time 04/10/2020 13.2  11.4 - 15.2 seconds Final   INR 04/10/2020 1.0  0.8 - 1.2 Final   Comment: (NOTE) INR goal varies based on device and disease states. Performed at [facility], [place] [address], [place], [place] [address]    aPTT 04/10/2020 30  24 - 36 seconds Final   Performed at [facility], [place] [address], [place], [place] [address]   ABO/RH(D) 04/10/2020 A NEG   Final   Antibody Screen 04/10/2020 NEG   Final   Sample Expiration 04/10/2020 04/24/2020,2359   Final   Extend sample reason 04/10/2020    Final                   Value:NO TRANSFUSIONS OR PREGNANCY  IN THE PAST 3 MONTHS Performed at [place] [address], [place], [place] [address]   Office Visit on 03/19/2020  Component Date Value Ref Range Status   Hemoglobin A1C 03/19/2020 5.3  4.0 - 5.6 % Final   Color, UA 03/19/2020 Yellow   Final   Clarity, UA 03/19/2020 Clear   Final   Glucose, UA 03/19/2020 Negative  Negative Final   Bilirubin, UA 03/19/2020 Negative   Final   Ketones, UA 03/19/2020 Negative   Final   Spec Grav, UA 03/19/2020 1.020  1.010 - 1.025 Final   Blood, UA 03/19/2020 3+   Final   pH, UA 03/19/2020 6.0  5.0 - 8.0 Final   Protein, UA 03/19/2020 Negative  Negative Final   Urobilinogen, UA 03/19/2020 0.2  0.2 or 1.0 E.U./dL Final   Nitrite, UA 03/19/2020 Negative   Final   Leukocytes, UA 03/19/2020 Negative  Negative Final  Lab on 03/12/2020  Component Date Value Ref Range Status   WBC 03/12/2020 6.2  4.0 - 10.5 K/uL Final   RBC 03/12/2020 4.24  4.22 - 5.81 Mil/uL Final   Hemoglobin 03/12/2020 13.4  13.0 - 17.0 g/dL Final   HCT 03/12/2020 39.1  39.0 - 52.0 % Final   MCV 03/12/2020 92.1  78.0 - 100.0 fl Final   MCHC 03/12/2020 34.4  30.0 - 36.0 g/dL Final   RDW 03/12/2020 13.1  11.5 - 15.5 % Final   Platelets 03/12/2020 226.0  150.0 - 400.0 K/uL Final   Neutrophils Relative % 03/12/2020 54.7  43.0 - 77.0 % Final   Lymphocytes Relative 03/12/2020 29.3  12.0 - 46.0 % Final   Monocytes Relative 03/12/2020 10.3  3.0 - 12.0 % Final   Eosinophils Relative 03/12/2020 4.7  0.0 - 5.0 % Final   Basophils  Relative 03/12/2020 1.0  0.0 - 3.0 % Final   Neutro Abs 03/12/2020 3.4  1.4 - 7.7 K/uL Final   Lymphs Abs 03/12/2020 1.8  0.7 - 4.0 K/uL Final   Monocytes Absolute 03/12/2020 0.6  0.1 - 1.0 K/uL Final   Eosinophils Absolute 03/12/2020 0.3  0.0 - 0.7 K/uL Final   Basophils Absolute 03/12/2020 0.1  0.0 - 0.1 K/uL Final   Sodium 03/12/2020 139  135 - 145 mEq/L Final   Potassium 03/12/2020 4.1  3.5 - 5.1 mEq/L Final    Chloride 03/12/2020 108  96 - 112 mEq/L Final   CO2 03/12/2020 27  19 - 32 mEq/L Final   Glucose, Bld 03/12/2020 101* 70 - 99 mg/dL Final   BUN 03/12/2020 21  6 - 23 mg/dL Final   Creatinine, Ser 03/12/2020 1.11  0.40 - 1.50 mg/dL Final   Total Bilirubin 03/12/2020 0.6  0.2 - 1.2 mg/dL Final   Alkaline Phosphatase 03/12/2020 29* 39 - 117 U/L Final   AST 03/12/2020 18  0 - 37 U/L Final   ALT 03/12/2020 14  0 - 53 U/L Final   Total Protein 03/12/2020 6.5  6.0 - 8.3 g/dL Final   Albumin 03/12/2020 4.0  3.5 - 5.2 g/dL Final   GFR 03/12/2020 62.07  >60.00 mL/min Final   Calculated using the CKD-EPI Creatinine Equation (2021)   Calcium 03/12/2020 9.0  8.4 - 10.5 mg/dL Final   Cholesterol 03/12/2020 158  0 - 200 mg/dL Final   ATP III Classification       Desirable:  < 200 mg/dL               Borderline High:  200 - 239 mg/dL          High:  > = 240 mg/dL   Triglycerides 03/12/2020 37.0  0.0 - 149.0 mg/dL Final   Normal:  <150 mg/dLBorderline High:  150 - 199 mg/dL   HDL 03/12/2020 51.70  >39.00 mg/dL Final   VLDL 03/12/2020 7.4  0.0 - 40.0 mg/dL Final   LDL Cholesterol 03/12/2020 99  0 - 99 mg/dL Final   Total CHOL/HDL Ratio 03/12/2020 3   Final                  Men          Women1/2 Average Risk     3.4          3.3Average Risk          5.0          4.42X Average Risk          9.6          7.13X Average Risk          15.0          11.0                       NonHDL 03/12/2020 106.47   Final   NOTE:  Non-HDL goal should be 30 mg/dL higher than patient's LDL goal (i.e. LDL goal of < 70 mg/dL, would have non-HDL goal of < 100 mg/dL)     X-Rays:DG Pelvis Portable  Result Date: 04/22/2020 CLINICAL DATA:  Status post right total hip arthroplasty. EXAM: PORTABLE PELVIS 1-2 VIEWS COMPARISON:  None. FINDINGS: Status post bilateral hip arthroplasties. The femoral and acetabular components are well situated. Expected postoperative changes are noted around the right hip. IMPRESSION:  Status post bilateral hip arthroplasties. Electronically  Signed   By: Marijo Conception M.D.   On: 04/22/2020 16:29   DG C-Arm 1-60 Min-No Report  Result Date: 04/22/2020 CLINICAL DATA:  Status post right hip arthroplasty. EXAM: OPERATIVE right HIP (WITH PELVIS IF PERFORMED) 2 VIEWS TECHNIQUE: Fluoroscopic spot image(s) were submitted for interpretation post-operatively. Radiation exposure index: 0.7885 mGy. COMPARISON:  None. FINDINGS: Two intraoperative fluoroscopic images demonstrate the right femoral and acetabular components to be well situated. IMPRESSION: Status post right total hip arthroplasty. Electronically Signed   By: Marijo Conception M.D.   On: 04/22/2020 15:50   DG HIP OPERATIVE UNILAT WITH PELVIS RIGHT  Result Date: 04/22/2020 CLINICAL DATA:  Status post right hip arthroplasty. EXAM: OPERATIVE right HIP (WITH PELVIS IF PERFORMED) 2 VIEWS TECHNIQUE: Fluoroscopic spot image(s) were submitted for interpretation post-operatively. Radiation exposure index: 0.7885 mGy. COMPARISON:  None. FINDINGS: Two intraoperative fluoroscopic images demonstrate the right femoral and acetabular components to be well situated. IMPRESSION: Status post right total hip arthroplasty. Electronically Signed   By: Marijo Conception M.D.   On: 04/22/2020 15:50    EKG: Orders placed or performed in visit on 03/19/20   EKG 12-Lead     Hospital Course: Samuel Matthews is a 80 y.o. who was admitted to [facility]. They were brought to the operating room on 04/22/2020 and underwent Procedure(s): [place].  Patient tolerated the procedure well and was later transferred to the recovery room and then to the orthopaedic floor for postoperative care. They were given PO and IV analgesics for pain control following their surgery. They were given 24 hours of postoperative antibiotics of  Anti-infectives (From admission, onward)   Start     Dose/Rate Route Frequency Ordered Stop   04/22/20  2030  ceFAZolin (ANCEF) IVPB 2g/100 mL premix        2 g 200 mL/hr over 30 Minutes Intravenous Every 6 hours 04/22/20 1558 04/23/20 0400   04/22/20 1200  ceFAZolin (ANCEF) IVPB 2g/100 mL premix        2 g 200 mL/hr over 30 Minutes Intravenous On call to O.R. 04/22/20 1147 04/22/20 1454     and started on DVT prophylaxis in the form of Xarelto.   PT and OT were ordered for total joint protocol. Discharge planning consulted to help with postop disposition and equipment needs.  Patient had a good night on the evening of surgery. They started to get up OOB with therapy on POD #0. Pt was seen during rounds and was ready to go home pending progress with therapy. He worked with therapy on POD #1 and was meeting his goals. Pt was discharged to home later that day in stable condition.  Diet: Regular diet Activity: WBAT Follow-up: in 2 weeks Disposition: Home with HEP Discharged Condition: stable   Discharge Instructions    Call MD / Call 911   Complete by: As directed    If you experience chest pain or shortness of breath, CALL 911 and be transported to the hospital emergency room.  If you develope a fever above 101 F, pus (white drainage) or increased drainage or redness at the wound, or calf pain, call your surgeon's office.   Change dressing   Complete by: As directed    You have an adhesive waterproof bandage over the incision. Leave this in place until your first follow-up appointment. Once you remove this you will not need to place another bandage.   Constipation Prevention   Complete by: As directed  Drink plenty of fluids.  Prune juice may be helpful.  You may use a stool softener, such as Colace (over the counter) 100 mg twice a day.  Use MiraLax (over the counter) for constipation as needed.   Diet - low sodium heart healthy   Complete by: As directed    Do not sit on low chairs, stoools or toilet seats, as it may be difficult to get up from low surfaces   Complete by: As directed     Driving restrictions   Complete by: As directed    No driving for two weeks   TED hose   Complete by: As directed    Use stockings (TED hose) for three weeks on both leg(s).  You may remove them at night for sleeping.   Weight bearing as tolerated   Complete by: As directed      Allergies as of 04/23/2020      Reactions   Lidocaine Other (See Comments)   Dropped blood pressures severely   Sulfa Drugs Cross Reactors Hives      Medication List    STOP taking these medications   Multivitamin Adult Tabs     TAKE these medications   HYDROcodone-acetaminophen 5-325 MG tablet Commonly known as: NORCO/VICODIN Take 1-2 tablets by mouth every 4 (four) hours as needed for severe pain.   methocarbamol 500 MG tablet Commonly known as: ROBAXIN Take 1 tablet (500 mg total) by mouth every 6 (six) hours as needed for muscle spasms.   rivaroxaban 10 MG Tabs tablet Commonly known as: XARELTO Take 1 tablet (10 mg total) by mouth daily with breakfast. Then take one 81 mg aspirin once a day for three weeks. Then discontinue aspirin.   traMADol 50 MG tablet Commonly known as: ULTRAM Take 1-2 tablets (50-100 mg total) by mouth every 6 (six) hours as needed for moderate pain.            Discharge Care Instructions  (From admission, onward)         Start     Ordered   04/23/20 0000  Weight bearing as tolerated        04/23/20 0756   04/23/20 0000  Change dressing       Comments: You have an adhesive waterproof bandage over the incision. Leave this in place until your first follow-up appointment. Once you remove this you will not need to place another bandage.   04/23/20 0756          Follow-up Information    [name], MD. Go on 05/07/2020.   Specialty: Orthopedic Surgery Why: You are scheduled for a post-operative appointment on 05-07-20 at 5:00 pm.  Contact information: [address] [place] [address] [phone]               Signed: Theresa Duty, PA-C Orthopedic Surgery 04/29/2020, 6:59 AM

## 2020-05-28 DIAGNOSIS — Z471 Aftercare following joint replacement surgery: Secondary | ICD-10-CM | POA: Diagnosis not present

## 2020-05-28 DIAGNOSIS — Z96641 Presence of right artificial hip joint: Secondary | ICD-10-CM | POA: Diagnosis not present

## 2020-08-07 DIAGNOSIS — Z961 Presence of intraocular lens: Secondary | ICD-10-CM | POA: Diagnosis not present

## 2020-08-13 ENCOUNTER — Ambulatory Visit (INDEPENDENT_AMBULATORY_CARE_PROVIDER_SITE_OTHER): Payer: Medicare Other | Admitting: Dermatology

## 2020-08-13 ENCOUNTER — Encounter: Payer: Self-pay | Admitting: Dermatology

## 2020-08-13 ENCOUNTER — Other Ambulatory Visit: Payer: Self-pay

## 2020-08-13 DIAGNOSIS — L814 Other melanin hyperpigmentation: Secondary | ICD-10-CM | POA: Diagnosis not present

## 2020-08-13 DIAGNOSIS — D0359 Melanoma in situ of other part of trunk: Secondary | ICD-10-CM

## 2020-08-13 DIAGNOSIS — L821 Other seborrheic keratosis: Secondary | ICD-10-CM | POA: Diagnosis not present

## 2020-08-13 DIAGNOSIS — L820 Inflamed seborrheic keratosis: Secondary | ICD-10-CM

## 2020-08-13 DIAGNOSIS — Z85828 Personal history of other malignant neoplasm of skin: Secondary | ICD-10-CM

## 2020-08-13 DIAGNOSIS — Z86006 Personal history of melanoma in-situ: Secondary | ICD-10-CM

## 2020-08-13 DIAGNOSIS — L578 Other skin changes due to chronic exposure to nonionizing radiation: Secondary | ICD-10-CM | POA: Diagnosis not present

## 2020-08-13 DIAGNOSIS — D3617 Benign neoplasm of peripheral nerves and autonomic nervous system of trunk, unspecified: Secondary | ICD-10-CM | POA: Diagnosis not present

## 2020-08-13 DIAGNOSIS — Z1283 Encounter for screening for malignant neoplasm of skin: Secondary | ICD-10-CM | POA: Diagnosis not present

## 2020-08-13 DIAGNOSIS — L570 Actinic keratosis: Secondary | ICD-10-CM

## 2020-08-13 DIAGNOSIS — D485 Neoplasm of uncertain behavior of skin: Secondary | ICD-10-CM | POA: Diagnosis not present

## 2020-08-13 NOTE — Patient Instructions (Addendum)
Cryotherapy Aftercare  . Wash gently with soap and water everyday.   Marland Kitchen Apply Vaseline and Band-Aid daily until healed.  Wound Care Instructions  1. Cleanse wound gently with soap and water once a day then pat dry with clean gauze. Apply a thing coat of Petrolatum (petroleum jelly, "Vaseline") over the wound (unless you have an allergy to this). We recommend that you use a new, sterile tube of Vaseline. Do not pick or remove scabs. Do not remove the yellow or white "healing tissue" from the base of the wound.  2. Cover the wound with fresh, clean, nonstick gauze and secure with paper tape. You may use Band-Aids in place of gauze and tape if the would is small enough, but would recommend trimming much of the tape off as there is often too much. Sometimes Band-Aids can irritate the skin.  3. You should call the office for your biopsy report after 1 week if you have not already been contacted.  4. If you experience any problems, such as abnormal amounts of bleeding, swelling, significant bruising, significant pain, or evidence of infection, please call the office immediately.  5. FOR ADULT SURGERY PATIENTS: If you need something for pain relief you may take 1 extra strength Tylenol (acetaminophen) AND 2 Ibuprofen (200mg  each) together every 4 hours as needed for pain. (do not take these if you are allergic to them or if you have a reason you should not take them.) Typically, you may only need pain medication for 1 to 3 days.    If you have any questions or concerns for your doctor, please call our main line at [phone] and press option 4 to reach your doctor's medical assistant. If no one answers, please leave a voicemail as directed and we will return your call as soon as possible. Messages left after 4 pm will be answered the following business day.   You may also send [place] a message via [place]. We typically respond to MyChart messages within 1-2 business days.  For prescription refills,  please ask your pharmacy to contact our office. Our fax number is [phone].  If you have an urgent issue when the clinic is closed that cannot wait until the next business day, you can page your doctor at the number below.    Please note that while we do our best to be available for urgent issues outside of office hours, we are not available 24/7.   If you have an urgent issue and are unable to reach [place], you may choose to seek medical care at your doctor's office, retail clinic, urgent care center, or emergency room.  If you have a medical emergency, please immediately call 911 or go to the emergency department.  Pager Numbers  - Dr. Nehemiah Massed: [phone]  - Dr. Laurence Ferrari: [phone]  - Dr. Nicole Kindred: [phone]  In the event of inclement weather, please call our main line at [phone] for an update on the status of any delays or closures.  Dermatology Medication Tips: Please keep the boxes that topical medications come in in order to help keep track of the instructions about where and how to use these. Pharmacies typically print the medication instructions only on the boxes and not directly on the medication tubes.   If your medication is too expensive, please contact our office at [phone] option 4 or send [place] a message through [place].   We are unable to tell what your co-pay for medications will be in advance as this is different depending  on your insurance coverage. However, we may be able to find a substitute medication at lower cost or fill out paperwork to get insurance to cover a needed medication.   If a prior authorization is required to get your medication covered by your insurance company, please allow [place] 1-2 business days to complete this process.  Drug prices often vary depending on where the prescription is filled and some pharmacies may offer cheaper prices.  The website www.goodrx.com contains coupons for medications through different pharmacies. The prices  here do not account for what the cost may be with help from insurance (it may be cheaper with your insurance), but the website can give you the price if you did not use any insurance.  - You can print the associated coupon and take it with your prescription to the pharmacy.  - You may also stop by our office during regular business hours and pick up a GoodRx coupon card.  - If you need your prescription sent electronically to a different pharmacy, notify our office through [facility] or by phone at [phone] option 4.

## 2020-08-13 NOTE — Progress Notes (Signed)
Follow-Up Visit   Subjective  Samuel Matthews is a 80 y.o. male who presents for the following: Follow-up (Patient here for 6 month follow-up UBSE. He has a history of melanoma in situ, BCC, SCC, Aks. He has a dark spot on his back noticed by his wife.).   The following portions of the chart were reviewed this encounter and updated as appropriate:       Review of Systems:  No other skin or systemic complaints except as noted in HPI or Assessment and Plan.  Objective  Well appearing patient in no apparent distress; mood and affect are within normal limits.  All skin waist up examined.  Objective  spinal upper back x 1: Erythematous keratotic or waxy stuck-on papule  Objective  Left paraspinal upper back: 5.92mm pink flesh papule, slightly pearly  Irrit nevus r/o bcc     Objective  Left spinal upper back: 5.94mm brown macule, lighter center Sk vs lentigo r/o atypia     Objective  L med post shoulder: Well healed scar with no evidence of recurrence.   Objective  L frontal hairline x 1, R frontal hairline x 1, vertex scalp x 2, L temporal hairline x 1 (5): Pink scaly macules.   Assessment & Plan   Skin cancer screening performed today.  Actinic Damage - chronic, secondary to cumulative UV radiation exposure/sun exposure over time - diffuse scaly erythematous macules with underlying dyspigmentation - Recommend daily broad spectrum sunscreen SPF 30+ to sun-exposed areas, reapply every 2 hours as needed.  - Recommend staying in the shade or wearing long sleeves, sun glasses (UVA+UVB protection) and wide brim hats (4-inch brim around the entire circumference of the hat). - Call for new or changing lesions.  History of Squamous Cell Carcinoma of the Skin - No evidence of recurrence today of the right lateral calf - Recommend regular full body skin exams - Recommend daily broad spectrum sunscreen SPF 30+ to sun-exposed areas, reapply every 2 hours as needed.  - Call  if any new or changing lesions are noted between office visits   History of Basal Cell Carcinoma of the Skin - No evidence of recurrence today - Recommend regular full body skin exams - Recommend daily broad spectrum sunscreen SPF 30+ to sun-exposed areas, reapply every 2 hours as needed.  - Call if any new or changing lesions are noted between office visits  History of Melanoma in Situ - No evidence of recurrence today of the spinal upper back and right posterior shoulder - Recommend regular full body skin exams - Recommend daily broad spectrum sunscreen SPF 30+ to sun-exposed areas, reapply every 2 hours as needed.  - Call if any new or changing lesions are noted between office visits  Lentigines - Scattered tan macules - Due to sun exposure - Benign-appering, observe - Recommend daily broad spectrum sunscreen SPF 30+ to sun-exposed areas, reapply every 2 hours as needed. - Call for any changes  Seborrheic Keratoses - Stuck-on, waxy, tan-brown papules and/or plaques  - Benign-appearing - Discussed benign etiology and prognosis. - Observe - Call for any changes  Inflamed seborrheic keratosis spinal upper back x 1  Prior to procedure, discussed risks of blister formation, small wound, skin dyspigmentation, or rare scar following cryotherapy.    Destruction of lesion - spinal upper back x 1  Destruction method: cryotherapy   Informed consent: discussed and consent obtained   Lesion destroyed using liquid nitrogen: Yes   Region frozen until ice ball extended beyond  lesion: Yes   Outcome: patient tolerated procedure well with no complications   Post-procedure details: wound care instructions given    Neoplasm of uncertain behavior of skin (2) Left paraspinal upper back  Skin / nail biopsy Type of biopsy: tangential   Informed consent: discussed and consent obtained   Patient was prepped and draped in usual sterile fashion: Area prepped with alcohol. Anesthesia: the  lesion was anesthetized in a standard fashion   Anesthetic:  0.5% bupivicaine w/ epinephrine 1-100,000 local infiltration Instrument used: flexible razor blade   Hemostasis achieved with: pressure, aluminum chloride and electrodesiccation   Outcome: patient tolerated procedure well    Destruction of lesion  Destruction method: electrodesiccation and curettage   Informed consent: discussed and consent obtained   Timeout:  patient name, date of birth, surgical site, and procedure verified Curettage performed in three different directions: Yes   Electrodesiccation performed over the curetted area: Yes   Lesion length (cm):  0.5 Lesion width (cm):  0.5 Margin per side (cm):  0.1 Final wound size (cm):  0.7 Hemostasis achieved with:  pressure, aluminum chloride and electrodesiccation Outcome: patient tolerated procedure well with no complications   Post-procedure details: wound care instructions given   Additional details:  Mupirocin ointment and Bandaid applied    Specimen 1 - Surgical pathology Differential Diagnosis: Irritated Nevus r/o BCC Check Margins: No 5.83mm pink flesh papule, slightly pearly  EDC today  Left spinal upper back  Epidermal / dermal shaving  Lesion diameter (cm):  0.7 Informed consent: discussed and consent obtained   Patient was prepped and draped in usual sterile fashion: Area prepped with alcohol. Anesthesia: the lesion was anesthetized in a standard fashion   Anesthetic:  0.5% bupivicaine w/ epinephrine 1-100,000 local infiltration Instrument used: flexible razor blade   Hemostasis achieved with: pressure, aluminum chloride and electrodesiccation   Outcome: patient tolerated procedure well   Post-procedure details: wound care instructions given   Post-procedure details comment:  Ointment and small bandage applied  Specimen 2 - Surgical pathology Differential Diagnosis: SK vs Lentigo r/o Atypia Check Margins: Yes 5.62mm brown macule, lighter  center   History of basal cell carcinoma (BCC) L med post shoulder  Clear. Observe for recurrence. Call clinic for new or changing lesions.  Recommend regular skin exams, daily broad-spectrum spf 30+ sunscreen use, and photoprotection.     AK (actinic keratosis) (5) L frontal hairline x 1, R frontal hairline x 1, vertex scalp x 2, L temporal hairline x 1  Prior to procedure, discussed risks of blister formation, small wound, skin dyspigmentation, or rare scar following cryotherapy.    Destruction of lesion - L frontal hairline x 1, R frontal hairline x 1, vertex scalp x 2, L temporal hairline x 1  Destruction method: cryotherapy   Informed consent: discussed and consent obtained   Lesion destroyed using liquid nitrogen: Yes   Region frozen until ice ball extended beyond lesion: Yes   Outcome: patient tolerated procedure well with no complications   Post-procedure details: wound care instructions given    Return in about 6 months (around 02/12/2021) for UBSE, h/o skin ca.  IJamesetta Orleans, CMA, am acting as scribe for Brendolyn Patty, MD .  Documentation: I have reviewed the above documentation for accuracy and completeness, and I agree with the above.  Brendolyn Patty MD

## 2020-09-18 ENCOUNTER — Encounter: Payer: Self-pay | Admitting: Dermatology

## 2020-09-18 ENCOUNTER — Other Ambulatory Visit: Payer: Self-pay

## 2020-09-18 ENCOUNTER — Ambulatory Visit (INDEPENDENT_AMBULATORY_CARE_PROVIDER_SITE_OTHER): Payer: Medicare Other | Admitting: Dermatology

## 2020-09-18 DIAGNOSIS — D0359 Melanoma in situ of other part of trunk: Secondary | ICD-10-CM | POA: Diagnosis not present

## 2020-09-18 NOTE — Progress Notes (Signed)
   Follow-Up Visit   Subjective  Samuel Matthews is a 80 y.o. 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   The following portions of the chart were reviewed this encounter and updated as appropriate:        Review of Systems:  No other skin or systemic complaints except as noted in HPI or Assessment and Plan.  Objective  Well appearing patient in no apparent distress; mood and affect are within normal limits.  A focused examination was performed including back. Relevant physical exam findings are noted in the Assessment and Plan.  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 Instrument used: #15 blade   Hemostasis achieved with: suture, pressure and electrodesiccation   Outcome: patient tolerated procedure well with no complications    Skin repair Complexity:  Complex Final length (cm):  5 Informed consent: discussed and consent obtained   Timeout: patient name, date of birth, surgical site, and procedure verified   Reason for type of repair: reduce tension to allow closure, reduce the risk of dehiscence, infection, and necrosis, reduce subcutaneous dead space and avoid a hematoma and allow closure of the large defect   Undermining: area extensively undermined   Undermining comment:  1.7 cm Subcutaneous layers (deep stitches):  Suture  size:  3-0 Suture type: Vicryl (polyglactin 910)   Stitches:  Buried vertical mattress Fine/surface layer approximation (top stitches):  Suture size:  3-0 Suture type: nylon   Stitches: simple interrupted   Suture removal (days):  7 Hemostasis achieved with: suture Outcome: patient tolerated procedure well with no complications   Post-procedure details: sterile dressing applied and wound care instructions given   Dressing type: pressure dressing (Mupirocin)    Specimen 1 - Surgical pathology Differential Diagnosis: D03.59 Melanoma IS  Check Margins: yes Pink bx site 0.7cm [id number] Sup 12 o'clock tag  Lentigo maligna type Bx proven, 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  I, Sonya Hupman, RMA, am acting as scribe for Brendolyn Patty, MD .  Documentation: I have reviewed the above documentation for accuracy and completeness, and I agree with the above.  Brendolyn Patty MD

## 2020-09-18 NOTE — Patient Instructions (Signed)
Wound Care Instructions  Cleanse wound gently with soap and water once a day then pat dry with clean gauze. Apply a thing coat of Petrolatum (petroleum jelly, "Vaseline") over the wound (unless you have an allergy to this). We recommend that you use a new, sterile tube of Vaseline. Do not pick or remove scabs. Do not remove the yellow or white "healing tissue" from the base of the wound.  Cover the wound with fresh, clean, nonstick gauze and secure with paper tape. You may use Band-Aids in place of gauze and tape if the would is small enough, but would recommend trimming much of the tape off as there is often too much. Sometimes Band-Aids can irritate the skin.  You should call the office for your biopsy report after 1 week if you have not already been contacted.  If you experience any problems, such as abnormal amounts of bleeding, swelling, significant bruising, significant pain, or evidence of infection, please call the office immediately.  FOR ADULT SURGERY PATIENTS: If you need something for pain relief you may take 1 extra strength Tylenol (acetaminophen) AND 2 Ibuprofen (200mg each) together every 4 hours as needed for pain. (do not take these if you are allergic to them or if you have a reason you should not take them.) Typically, you may only need pain medication for 1 to 3 days.   If you have any questions or concerns for your doctor, please call our main line at [phone] and press option 4 to reach your doctor's medical assistant. If no one answers, please leave a voicemail as directed and we will return your call as soon as possible. Messages left after 4 pm will be answered the following business day.   You may also send us a message via MyChart. We typically respond to MyChart messages within 1-2 business days.  For prescription refills, please ask your pharmacy to contact our office. Our fax number is [phone].  If you have an urgent issue when the clinic is closed that  cannot wait until the next business day, you can page your doctor at the number below.    Please note that while we do our best to be available for urgent issues outside of office hours, we are not available 24/7.   If you have an urgent issue and are unable to reach us, you may choose to seek medical care at your doctor's office, retail clinic, urgent care center, or emergency room.  If you have a medical emergency, please immediately call 911 or go to the emergency department.  Pager Numbers  - Dr. Kowalski: [phone]  - Dr. Moye: [phone]  - Dr. Stewart: [phone]  In the event of inclement weather, please call our main line at [phone] for an update on the status of any delays or closures.  Dermatology Medication Tips: Please keep the boxes that topical medications come in in order to help keep track of the instructions about where and how to use these. Pharmacies typically print the medication instructions only on the boxes and not directly on the medication tubes.   If your medication is too expensive, please contact our office at [phone] option 4 or send us a message through MyChart.   We are unable to tell what your co-pay for medications will be in advance as this is different depending on your insurance coverage. However, we may be able to find a substitute medication at lower cost or fill out paperwork to get insurance to cover a needed   medication.   If a prior authorization is required to get your medication covered by your insurance company, please allow us 1-2 business days to complete this process.  Drug prices often vary depending on where the prescription is filled and some pharmacies may offer cheaper prices.  The website www.goodrx.com contains coupons for medications through different pharmacies. The prices here do not account for what the cost may be with help from insurance (it may be cheaper with your insurance), but the website can give you the  price if you did not use any insurance.  - You can print the associated coupon and take it with your prescription to the pharmacy.  - You may also stop by our office during regular business hours and pick up a GoodRx coupon card.  - If you need your prescription sent electronically to a different pharmacy, notify our office through  MyChart or by phone at [phone] option 4.   

## 2020-09-19 ENCOUNTER — Telehealth: Payer: Self-pay

## 2020-09-19 NOTE — Telephone Encounter (Signed)
Patient doing fine after yesterdays surgery./sh 

## 2020-09-25 ENCOUNTER — Other Ambulatory Visit: Payer: Self-pay

## 2020-09-25 ENCOUNTER — Ambulatory Visit (INDEPENDENT_AMBULATORY_CARE_PROVIDER_SITE_OTHER): Payer: Medicare Other | Admitting: Dermatology

## 2020-09-25 DIAGNOSIS — D0359 Melanoma in situ of other part of trunk: Secondary | ICD-10-CM

## 2020-09-25 NOTE — Progress Notes (Signed)
   Follow-Up Visit   Subjective  Samuel Matthews is a 80 y.o. male who presents for the following: Suture / Staple Removal (Patient here today for suture removal on left spinal upper back. He reports no concerns and no pain. Biopsy results are pending. ).   The following portions of the chart were reviewed this encounter and updated as appropriate:      Objective  Well appearing patient in no apparent distress; mood and affect are within normal limits.  A focused examination was performed including left upper spinal back. Relevant physical exam findings are noted in the Assessment and Plan.   Assessment & Plan  Melanoma in situ of back ([place])  Encounter for Removal of Sutures - Incision site at the left spinal upper back is clean, dry and intact - Wound cleansed, sutures removed, wound cleansed and steri strips applied.  - Discussed pathology results are pending at this time.   - Patient advised to keep steri-strips dry until they fall off. - Scars remodel for a full year. - Once steri-strips fall off, patient can apply over-the-counter silicone scar cream each night to help with scar remodeling if desired. - Patient advised to call with any concerns or if they notice any new or changing lesions.  Return for keep follow up as scheduled .  I, Ruthell Rummage, CMA, am acting as scribe for Brendolyn Patty, MD.  Documentation: I have reviewed the above documentation for accuracy and completeness, and I agree with the above.  Brendolyn Patty MD

## 2020-09-25 NOTE — Patient Instructions (Signed)
After Suture Removal  If your medical team has placed Steri-Strips (white adhesive strips covering the surgical site to provide extra support): Keep the area dry until they fall off.  Do not peel them off. Just let them fall off on their own.  If the edges peel up, you can trim them with scissors.   If your team has not placed Steri-Strips: Wash the area daily with soap and water. Then coat the incision site with plain Vaseline and cover with a bandage. Do this daily for 5 days after the sutures are removed. After that, no additional wound care is generally needed.  However, if you would like to help fade the scar, you can apply a silicone scar cream, gel or sheet every night. The scar will remodel for one year after the procedure. If a skin cancer was removed, be sure to keep your appointment with your dermatologist for follow-up and let your dermatology team know if you have any new or changing spots between visits.    Please call our office at [phone] for any questions or concerns.  If you have any questions or concerns for your doctor, please call our main line at [phone] and press option 4 to reach your doctor's medical assistant. If no one answers, please leave a voicemail as directed and we will return your call as soon as possible. Messages left after 4 pm will be answered the following business day.   You may also send [place] a message via [place]. We typically respond to MyChart messages within 1-2 business days.  For prescription refills, please ask your pharmacy to contact our office. Our fax number is [phone].  If you have an urgent issue when the clinic is closed that cannot wait until the next business day, you can page your doctor at the number below.    Please note that while we do our best to be available for urgent issues outside of office hours, we are not available 24/7.   If you have an urgent issue and are unable to reach [place], you may choose to seek medical  care at your doctor's office, retail clinic, urgent care center, or emergency room.  If you have a medical emergency, please immediately call 911 or go to the emergency department.  Pager Numbers  - Dr. Nehemiah Massed: [phone]  - Dr. Laurence Ferrari: [phone]  - Dr. Nicole Kindred: [phone]  In the event of inclement weather, please call our main line at [phone] for an update on the status of any delays or closures.  Dermatology Medication Tips: Please keep the boxes that topical medications come in in order to help keep track of the instructions about where and how to use these. Pharmacies typically print the medication instructions only on the boxes and not directly on the medication tubes.   If your medication is too expensive, please contact our office at [phone] option 4 or send [place] a message through [place].   We are unable to tell what your co-pay for medications will be in advance as this is different depending on your insurance coverage. However, we may be able to find a substitute medication at lower cost or fill out paperwork to get insurance to cover a needed medication.   If a prior authorization is required to get your medication covered by your insurance company, please allow [place] 1-2 business days to complete this process.  Drug prices often vary depending on where the prescription is filled and some pharmacies may offer cheaper prices.  The  website www.goodrx.com contains coupons for medications through different pharmacies. The prices here do not account for what the cost may be with help from insurance (it may be cheaper with your insurance), but the website can give you the price if you did not use any insurance.  - You can print the associated coupon and take it with your prescription to the pharmacy.  - You may also stop by our office during regular business hours and pick up a GoodRx coupon card.  - If you need your prescription sent electronically to a different  pharmacy, notify our office through [facility] or by phone at [phone] option 4.

## 2020-09-30 ENCOUNTER — Telehealth: Payer: Self-pay

## 2020-09-30 NOTE — Telephone Encounter (Signed)
Left pt msg of bx results.  Pt has been in to have sutures removed but results were pending at that time./sh

## 2020-09-30 NOTE — Telephone Encounter (Signed)
-----   Message from Brendolyn Patty, MD sent at 09/30/2020 10:35 AM EDT ----- Skin (M), L spinal upper back RESIDUAL MELANOMA IN SITU, MARGINS FREE   - please call patient

## 2021-02-17 ENCOUNTER — Other Ambulatory Visit: Payer: Self-pay

## 2021-02-17 ENCOUNTER — Ambulatory Visit (INDEPENDENT_AMBULATORY_CARE_PROVIDER_SITE_OTHER): Payer: Medicare Other | Admitting: Dermatology

## 2021-02-17 DIAGNOSIS — Z1283 Encounter for screening for malignant neoplasm of skin: Secondary | ICD-10-CM

## 2021-02-17 DIAGNOSIS — L570 Actinic keratosis: Secondary | ICD-10-CM | POA: Diagnosis not present

## 2021-02-17 DIAGNOSIS — Z86006 Personal history of melanoma in-situ: Secondary | ICD-10-CM | POA: Diagnosis not present

## 2021-02-17 DIAGNOSIS — Z85828 Personal history of other malignant neoplasm of skin: Secondary | ICD-10-CM | POA: Diagnosis not present

## 2021-02-17 DIAGNOSIS — L820 Inflamed seborrheic keratosis: Secondary | ICD-10-CM | POA: Diagnosis not present

## 2021-02-17 DIAGNOSIS — D229 Melanocytic nevi, unspecified: Secondary | ICD-10-CM | POA: Diagnosis not present

## 2021-02-17 DIAGNOSIS — L814 Other melanin hyperpigmentation: Secondary | ICD-10-CM

## 2021-02-17 DIAGNOSIS — Z872 Personal history of diseases of the skin and subcutaneous tissue: Secondary | ICD-10-CM

## 2021-02-17 DIAGNOSIS — L821 Other seborrheic keratosis: Secondary | ICD-10-CM | POA: Diagnosis not present

## 2021-02-17 DIAGNOSIS — L578 Other skin changes due to chronic exposure to nonionizing radiation: Secondary | ICD-10-CM | POA: Diagnosis not present

## 2021-02-17 DIAGNOSIS — D1801 Hemangioma of skin and subcutaneous tissue: Secondary | ICD-10-CM

## 2021-02-17 DIAGNOSIS — D3617 Benign neoplasm of peripheral nerves and autonomic nervous system of trunk, unspecified: Secondary | ICD-10-CM | POA: Diagnosis not present

## 2021-02-17 DIAGNOSIS — D492 Neoplasm of unspecified behavior of bone, soft tissue, and skin: Secondary | ICD-10-CM

## 2021-02-17 NOTE — Progress Notes (Signed)
Follow-Up Visit   Subjective  Samuel Matthews is a 80 y.o. 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  No new  or concerning spots today. He has a few spots on his chest that get irritated.  The following portions of the chart were reviewed this encounter and updated as appropriate:       Review of Systems:  No other skin or systemic complaints except as noted in HPI or Assessment and Plan.  Objective  Well appearing patient in no apparent distress; mood and affect are within normal limits.  A focused examination was performed including upper body skin exam and legs. Relevant physical exam findings are noted in the Assessment and Plan.  spinal upper back, R post shoulder, L spinal upper back Well healed scars with no evidence of recurrence  R lat mid lower back 0.6cm pink brown pap     chest x 4, Total = 4 (4) Erythematous keratotic or waxy stuck-on papule   crown scalp x 6 (6) Pink scaly macules    Assessment & Plan   Lentigines - Scattered tan macules - Due to sun exposure - Benign-appearing, observe - Recommend daily broad spectrum sunscreen SPF 30+ to sun-exposed areas, reapply every 2 hours as needed. - Call for any changes  Seborrheic Keratoses - Stuck-on, waxy, tan-brown papules and/or plaques  - Benign-appearing - Discussed benign etiology and prognosis. - Observe - Call for any changes - trunk  Melanocytic Nevi - Tan-brown and/or pink-flesh-colored symmetric macules and papules - Benign appearing on exam today - Observation - Call clinic for new or changing moles - Recommend daily use of broad spectrum spf 30+ sunscreen to sun-exposed areas.  - trunk  Hemangiomas - Red papules - Discussed benign nature - Observe - Call for any changes - trunk  Actinic Damage - Chronic condition, secondary  to cumulative UV/sun exposure - diffuse scaly erythematous macules with underlying dyspigmentation - Recommend daily broad spectrum sunscreen SPF 30+ to sun-exposed areas, reapply every 2 hours as needed.  - Staying in the shade or wearing long sleeves, sun glasses (UVA+UVB protection) and wide brim hats (4-inch brim around the entire circumference of the hat) are also recommended for sun protection.  - Call for new or changing lesions. -face, chest  History of Basal Cell Carcinoma of the Skin - No evidence of recurrence today - Recommend regular full body skin exams - Recommend daily broad spectrum sunscreen SPF 30+ to sun-exposed areas, reapply every 2 hours as needed.  - Call if any new or changing lesions are noted between office visits  - multiple  History of Squamous Cell Carcinoma of the Skin - No evidence of recurrence today - Recommend regular full body skin exams - Recommend daily broad spectrum sunscreen SPF 30+ to sun-exposed areas, reapply every 2 hours as needed.  - Call if any new or changing lesions are noted between office visits - R lat calf    Skin cancer screening performed today.  History of melanoma in situ spinal upper back, R post shoulder, L spinal upper back  Most recent excised 09/18/20 L spinal upper back  Clear. Observe for recurrence. Call clinic for new or changing lesions.  Recommend regular skin exams, daily broad-spectrum spf 30+ sunscreen use, and photoprotection.    Neoplasm of skin R lat  mid lower back  Skin / nail biopsy Type of biopsy: tangential   Informed consent: discussed and consent obtained   Anesthesia: the lesion was anesthetized in a standard fashion   Anesthesia comment:  Area prepped with alcohol Anesthetic:  1% lidocaine w/ epinephrine 1-100,000 buffered w/ 8.4% NaHCO3 Instrument used: flexible razor blade   Hemostasis achieved with: pressure, aluminum chloride and electrodesiccation   Outcome: patient tolerated procedure  well   Post-procedure details: wound care instructions given   Post-procedure details comment:  Ointment and small bandage applied  Specimen 1 - Surgical pathology Differential Diagnosis: D48.5 Irritated Nevus r/o pigmented BCC  Check Margins: No 0.6cm pink brown pap  Inflamed seborrheic keratosis chest x 4, Total = 4  Destruction of lesion - chest x 4, Total = 4  Destruction method: cryotherapy   Informed consent: discussed and consent obtained   Lesion destroyed using liquid nitrogen: Yes   Region frozen until ice ball extended beyond lesion: Yes   Outcome: patient tolerated procedure well with no complications   Post-procedure details: wound care instructions given   Additional details:  Prior to procedure, discussed risks of blister formation, small wound, skin dyspigmentation, or rare scar following cryotherapy. Recommend Vaseline ointment to treated areas while healing.   AK (actinic keratosis) (6) crown scalp x 6  Actinic keratoses are precancerous spots that appear secondary to cumulative UV radiation exposure/sun exposure over time. They are chronic with expected duration over 1 year. A portion of actinic keratoses will progress to squamous cell carcinoma of the skin. It is not possible to reliably predict which spots will progress to skin cancer and so treatment is recommended to prevent development of skin cancer.  Recommend daily broad spectrum sunscreen SPF 30+ to sun-exposed areas, reapply every 2 hours as needed.  Recommend staying in the shade or wearing long sleeves, sun glasses (UVA+UVB protection) and wide brim hats (4-inch brim around the entire circumference of the hat). Call for new or changing lesions.   Destruction of lesion - crown scalp x 6  Destruction method: cryotherapy   Informed consent: discussed and consent obtained   Lesion destroyed using liquid nitrogen: Yes   Region frozen until ice ball extended beyond lesion: Yes   Outcome: patient  tolerated procedure well with no complications   Post-procedure details: wound care instructions given   Additional details:  Prior to procedure, discussed risks of blister formation, small wound, skin dyspigmentation, or rare scar following cryotherapy. Recommend Vaseline ointment to treated areas while healing.   Return in about 6 months (around 08/18/2021) for UBSE, Hx of Melanoma IS, Hx of BCC, Hx of SCC, Hx of AKs.  I, Othelia Pulling, RMA, am acting as scribe for Brendolyn Patty, MD .  Documentation: I have reviewed the above documentation for accuracy and completeness, and I agree with the above.  Brendolyn Patty MD

## 2021-02-17 NOTE — Patient Instructions (Addendum)

## 2021-02-19 ENCOUNTER — Telehealth: Payer: Self-pay

## 2021-02-19 NOTE — Telephone Encounter (Signed)
-----   Message from Brendolyn Patty, MD sent at 02/18/2021  6:43 PM EST ----- Skin , right lat mid lower back NEUROFIBROMA, BASE INVOLVED  benign - please call patient

## 2021-02-19 NOTE — Telephone Encounter (Signed)
Pt advised of bx results./sh 

## 2021-02-26 IMAGING — RF DG HIP (WITH PELVIS) OPERATIVE*R*
1 series · 2 of 2 positions shown · non-contrast
Comparison: None.

CLINICAL DATA: Status post right hip arthroplasty.

EXAM:
OPERATIVE right HIP (WITH PELVIS IF PERFORMED) 2 VIEWS
TECHNIQUE: Fluoroscopic spot image(s) were submitted for interpretation
post-operatively.
Radiation exposure index: 0.7885 mGy.

[Series 1: unknown protocol · 0.20mm/px · 2 of 2 slices shown]
[im 1/2]
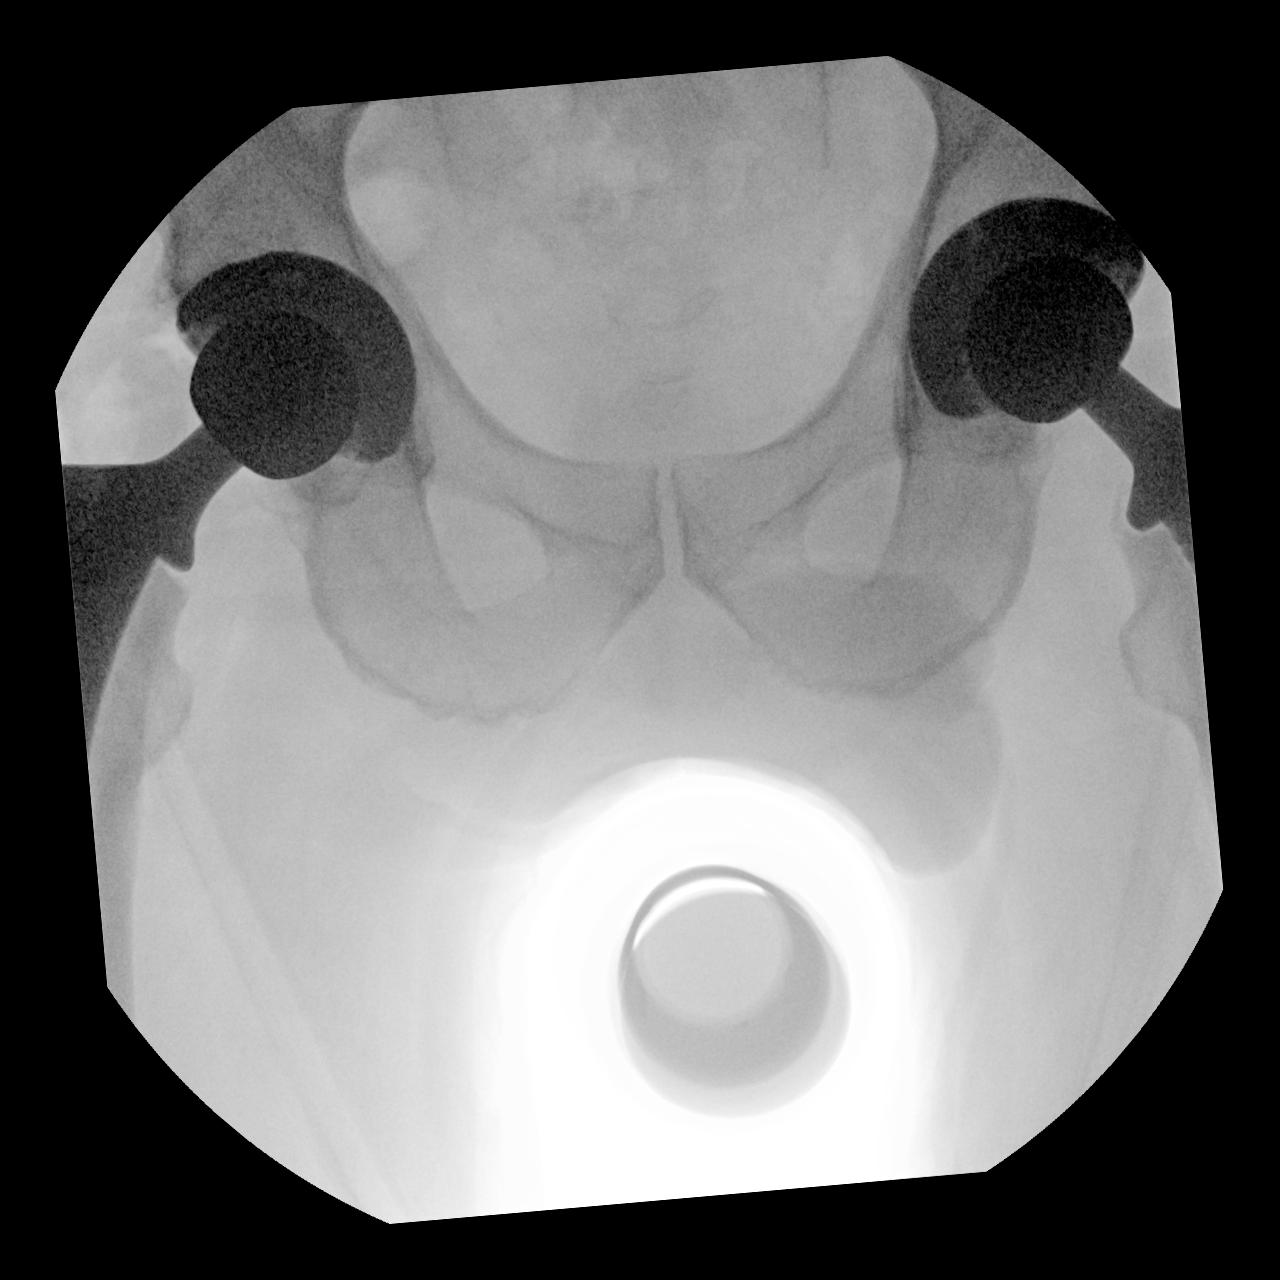
[im 2/2]
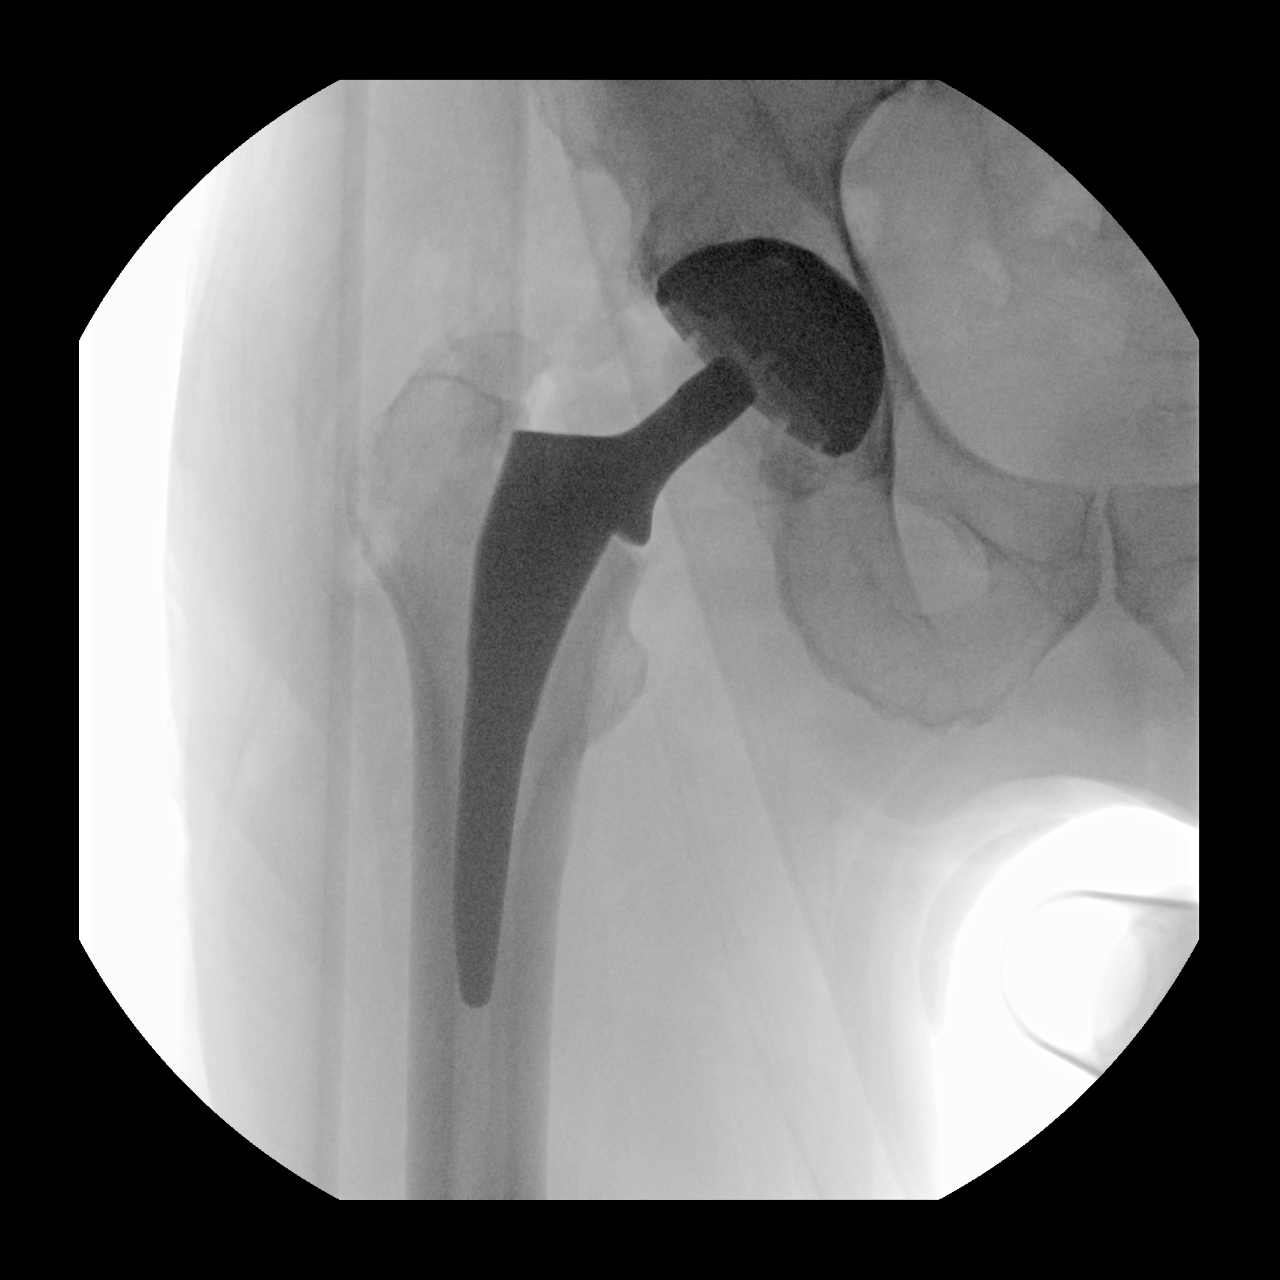

[2 of 2 positions shown; findings below may reference images not displayed]

FINDINGS: Two intraoperative fluoroscopic images demonstrate the right femoral
and acetabular components to be well situated.
IMPRESSION: Status post right total hip arthroplasty.

## 2021-03-06 NOTE — Progress Notes (Signed)
Subjective:   Samuel Matthews is a 80 y.o. male who presents for Medicare Annual/Subsequent preventive examination.  I connected with Lidia Collum today by telephone and verified that I am speaking with the correct person using two identifiers. Location patient: home Location provider: work Persons participating in the virtual visit: patient, Marine scientist.    I discussed the limitations, risks, security and privacy concerns of performing an evaluation and management service by telephone and the availability of in person appointments. I also discussed with the patient that there may be a patient responsible charge related to this service. The patient expressed understanding and verbally consented to this telephonic visit.    Interactive audio and video telecommunications were attempted between this provider and patient, however failed, due to patient having technical difficulties OR patient did not have access to video capability.  We continued and completed visit with audio only.  Some vital signs may be absent or patient reported.   Time Spent with patient on telephone encounter: 20 minutes  Review of Systems     Cardiac Risk Factors include: advanced age (>54men, >10 women)     Objective:    Today's Vitals   03/11/21 1314  Weight: 192 lb (87.1 kg)  Height: 5\' 9"  (1.753 m)  PainSc: 2    Body mass index is 28.35 kg/m.  Advanced Directives 03/11/2021 04/22/2020 04/22/2020 04/22/2020 04/10/2020 03/05/2020 01/18/2018  Does Patient Have a Medical Advance Directive? Yes Yes - Yes No;Yes Yes No  Type of Advance Directive [place];Living will [place];Living will [place];Living will [place];Living will [place];Living will [place];Living will Living will;Healthcare Power of Attorney  Does patient want to make changes to medical advance directive? Yes (MAU/Ambulatory/Procedural  Areas - Information given) No - Patient declined - - - - -  Copy of Healthcare Power of Attorney in Chart? - - - No - copy requested - No - copy requested No - copy requested  Would patient like information on creating a medical advance directive? - - - - - - No - Patient declined    Current Medications (verified) Outpatient Encounter Medications as of 03/11/2021  Medication Sig   HYDROcodone-acetaminophen (NORCO/VICODIN) 5-325 MG tablet Take 1-2 tablets by mouth every 4 (four) hours as needed for severe pain.   methocarbamol (ROBAXIN) 500 MG tablet Take 1 tablet (500 mg total) by mouth every 6 (six) hours as needed for muscle spasms.   rivaroxaban (XARELTO) 10 MG TABS tablet Take 1 tablet (10 mg total) by mouth daily with breakfast. Then take one 81 mg aspirin once a day for three weeks. Then discontinue aspirin.   traMADol (ULTRAM) 50 MG tablet Take 1-2 tablets (50-100 mg total) by mouth every 6 (six) hours as needed for moderate pain.   No facility-administered encounter medications on file as of 03/11/2021.    Allergies (verified) Lidocaine and Sulfa drugs cross reactors   History: Past Medical History:  Diagnosis Date   Actinic keratosis    Basal cell carcinoma 12/07/2016   L nasal dorsum inferior   Basal cell carcinoma 11/25/2015   L postauricular area   Basal cell carcinoma 05/06/2015   R chest, L neck   Basal cell carcinoma 10/30/2014   L ant shoulder   Basal cell carcinoma 02/06/2020   L med posterior shoulder, [facility]   Basal cell carcinoma of nose 2015   ([name])   Cancer [facility])  Hx: of squamous cell left calf   Complication of anesthesia    DR Nicole Kindred removed melanoma used lidocaine next day lightheaded, blood pressure low- ER- adm for 3 days due to hypotensiion - ? 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   right lateral calf   Past Surgical History:  Procedure Laterality Date   CATARACT EXTRACTION Bilateral 10/2014   cataracts     CHEST TUBE INSERTION  1982   COLONOSCOPY  07/2003   diverticulosis, int hem ([facility])   [place]   LASIK     SKIN CANCER EXCISION  1993, 2014   Left calf   TOTAL HIP ARTHROPLASTY Left 02/03/2013   [name], MD   TOTAL HIP ARTHROPLASTY Right 04/22/2020   Procedure: TOTAL HIP ARTHROPLASTY ANTERIOR APPROACH;  Surgeon: [name], MD;  Location: [facility];  Service: Orthopedics;  Laterality: Right;  173min   TRIGGER FINGER RELEASE  2001   Family History  Problem Relation Age of Onset   Coronary artery disease Mother 33   Hypertension Father    CVA Father    Healthy Sister    Healthy Brother    Diabetes Neg Hx    Cancer Neg Hx    Social History   Socioeconomic History   Marital status: Married    Spouse name: Not on file   Number of children: Not on file   Years of education: Not on file   Highest education level: Not on file  Occupational History   Occupation: Retired  Tobacco Use   Smoking status: Never   Smokeless tobacco: Never  Vaping Use   Vaping Use: Never used  Substance and Sexual Activity   Alcohol use: Yes    Comment: Occasional   Drug use: No   Sexual activity: Never  Other Topics Concern   Not on file  Social History Narrative   Caffeine: 2-3 cups coffee/day   Lives  with wife in [place], son in [place]   Occupation: retired, 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, gym twice weekly.    Diet: fruits/vegetables daily, 2x/wk red meat, fish 1x/wk   Social Determinants of Health   Financial Resource Strain: Low Risk    Difficulty of Paying Living Expenses: Not hard at all  Food Insecurity: No Food Insecurity   Worried About Charity fundraiser in the Last Year: Never true   Ran Out of Food in the Last Year: Never true  Transportation Needs: No Transportation Needs   Lack of Transportation (Medical): No   Lack  of Transportation (Non-Medical): No  Physical Activity: Sufficiently Active   Days of Exercise per Week: 3 days   Minutes of Exercise per Session: 150+ min  Stress: No Stress Concern Present   Feeling of Stress : Not at all  Social Connections: Moderately Integrated   Frequency of Communication with Friends and Family: More than three times a week   Frequency of Social Gatherings with Friends and Family: More than three times a week   Attends Religious Services: Never   Marine scientist or Organizations: Yes   Attends Music therapist: More than 4 times per year   Marital Status: Married    Tobacco Counseling Counseling given: Not Answered   Clinical Intake:  Pre-visit preparation completed: Yes  Pain : 0-10 Pain Score: 2  Pain Location: Hand Pain Orientation: Left Pain Descriptors / Indicators: Other (Comment) (Arthritis)     BMI - recorded: 28.35 Nutritional Status: BMI 25 -29 Overweight Nutritional Risks: None Diabetes: No  How often do you need to have someone help you when you read instructions, pamphlets, or other written materials from your doctor or pharmacy?: 1 - Never  Diabetic? No  Interpreter Needed?: No  Information entered by :: Orrin Brigham LPN   Activities of Daily Living In your present  state of health, do you have any difficulty performing the following activities: 03/11/2021 04/22/2020  Hearing? N N  Vision? N N  Difficulty concentrating or making decisions? N N  Walking or climbing stairs? N N  Dressing or bathing? N N  Doing errands, shopping? N N  Preparing Food and eating ? N -  Using the Toilet? N -  In the past six months, have you accidently leaked urine? N -  Do you have problems with loss of bowel control? N -  Managing your Medications? N -  Managing your Finances? N -  Housekeeping or managing your Housekeeping? N -  Some recent data might be hidden    Patient Care Team: Ria Bush, MD as PCP - General (Family Medicine)  Indicate any recent Medical Services you may have received from other than [facility] providers in the past year (date may be approximate).     Assessment:   This is a routine wellness examination for Samuel Matthews.  Hearing/Vision screen Hearing Screening - Comments:: No issues Vision Screening - Comments:: Last exam 08/2020, Dr. Geryl Councilman  Dietary issues and exercise activities discussed: Current Exercise Habits: Home exercise routine, Type of exercise: walking, Time (Minutes): 60 (more than 3 hours), Frequency (Times/Week): 3, Weekly Exercise (Minutes/Week): 180, Intensity: Moderate   Goals Addressed             This Visit's Progress    Patient Stated       Would like to maintain current routine and continue to be active       Depression Screen PHQ 2/9 Scores 03/11/2021 03/05/2020 01/25/2019 12/16/2017 12/10/2016 07/04/2015 04/15/2011  PHQ - 2 Score 0 0 0 0 0 0 0  PHQ- 9 Score - 0 - 0 0 - -    Fall Risk Fall Risk  03/11/2021 03/05/2020 02/08/2019 01/25/2019 12/16/2017  Falls in the past year? 0 0 0 0 No  Comment - - Emmi Telephone Survey: data to providers prior to load - -  Number falls in past yr: 0 0 - - -  Injury with Fall? 0 0 - - -  Risk for fall due to : No Fall Risks No Fall Risks - - -  Follow  up Falls prevention  discussed Falls evaluation completed;Falls prevention discussed - - -    FALL RISK PREVENTION PERTAINING TO THE HOME:  Any stairs in or around the home? Yes  If so, are there any without handrails? No  Home free of loose throw rugs in walkways, pet beds, electrical cords, etc? Yes  Adequate lighting in your home to reduce risk of falls? Yes   ASSISTIVE DEVICES UTILIZED TO PREVENT FALLS:  Life alert? No  Use of a cane, walker or w/c? No  Grab bars in the bathroom? No  Shower chair or bench in shower? Yes  Elevated toilet seat or a handicapped toilet? No   TIMED UP AND GO:  Was the test performed? No , visit completed over the phone.    Cognitive Function: Normal cognitive status assessed by this Nurse Health Advisor. No abnormalities found.   MMSE - Mini Mental State Exam 03/05/2020 12/16/2017 12/10/2016 07/04/2015  Orientation to time 5 5 5 5   Orientation to Place 5 5 5 5   Registration 3 3 3 3   Attention/ Calculation 5 0 0 0  Recall 3 3 3 3   Language- name 2 objects - 0 0 0  Language- repeat 1 1 1 1   Language- follow 3 step command - 3 3 3   Language- read & follow direction - 0 0 0  Write a sentence - 0 0 0  Copy design - 0 0 0  Total score - 20 20 20         Immunizations Immunization History  Administered Date(s) Administered   Fluad Quad(high Dose 65+) 01/25/2019, 03/19/2020   Influenza,inj,Quad PF,6+ Mos 12/13/2012   PFIZER(Purple Top)SARS-COV-2 Vaccination 04/03/2019, 04/26/2019, 02/20/2020   Pneumococcal Conjugate-13 07/04/2015   Pneumococcal Polysaccharide-23 10/04/2007, 04/28/2011   Td 03/16/2008   Zoster Recombinat (Shingrix) 10/15/2019, 12/15/2019   Zoster, Live 09/08/2013    TDAP status: Due, Education has been provided regarding the importance of this vaccine. Advised may receive this vaccine at local pharmacy or Health Dept. Aware to provide a copy of the vaccination record if obtained from local pharmacy or Health Dept. Verbalized acceptance and  understanding.  Flu Vaccine status: Up to date  Pneumococcal vaccine status: Up to date  Covid-19 vaccine status: Information provided on how to obtain vaccines.   Qualifies for Shingles Vaccine? Yes   Zostavax completed Yes   Shingrix Completed?: Yes  Screening Tests Health Maintenance  Topic Date Due   COVID-19 Vaccine (4 - Booster for Pfizer series) 04/16/2020   INFLUENZA VACCINE  10/14/2020   TETANUS/TDAP  03/05/2024 (Originally 03/16/2018)   Pneumonia Vaccine 17+ Years old  Completed   Zoster Vaccines- Shingrix  Completed   HPV VACCINES  Aged Out    Health Maintenance  Health Maintenance Due  Topic Date Due   COVID-19 Vaccine (4 - Booster for Pfizer series) 04/16/2020   INFLUENZA VACCINE  10/14/2020    Colorectal cancer screening: No longer required.   Lung Cancer Screening: (Low Dose CT Chest recommended if Age 36-80 years, 30 pack-year currently smoking OR have quit w/in 15years.) does not qualify.     Additional Screening:  Hepatitis C Screening: does not qualify  Vision Screening: Recommended annual ophthalmology exams for early detection of glaucoma and other disorders of the eye. Is the patient up to date with their annual eye exam?  Yes  Who is the provider or what is the name of the office in which the patient attends annual eye exams? Dr. Geryl Councilman  Dental Screening: Recommended annual  dental exams for proper oral hygiene  Community Resource Referral / Chronic Care Management: CRR required this visit?  No   CCM required this visit?  No      Plan:     I have personally reviewed and noted the following in the patients chart:   Medical and social history Use of alcohol, tobacco or illicit drugs  Current medications and supplements including opioid prescriptions. Patient is not currently taking opioid prescriptions. Functional ability and status Nutritional status Physical activity Advanced directives List of other physicians Hospitalizations,  surgeries, and ER visits in previous 12 months Vitals Screenings to include cognitive, depression, and falls Referrals and appointments  In addition, I have reviewed and discussed with patient certain preventive protocols, quality metrics, and best practice recommendations. A written personalized care plan for preventive services as well as general preventive health recommendations were provided to patient.   Due to this being a telephonic visit, the after visit summary with patients personalized plan was offered to patient via mail or my-chart. Patient would like to access on my-chart.     Loma Messing, LPN [id number]   Nurse Health Advisor  Nurse Notes: none

## 2021-03-07 ENCOUNTER — Ambulatory Visit: Payer: Medicare Other

## 2021-03-11 ENCOUNTER — Ambulatory Visit (INDEPENDENT_AMBULATORY_CARE_PROVIDER_SITE_OTHER): Payer: Medicare Other

## 2021-03-11 VITALS — Ht 69.0 in | Wt 192.0 lb

## 2021-03-11 DIAGNOSIS — Z0000 Encounter for general adult medical examination without abnormal findings: Secondary | ICD-10-CM

## 2021-03-11 NOTE — Patient Instructions (Addendum)
[name] , Thank you for taking time to complete your Medicare Wellness Visit. I appreciate your ongoing commitment to your health goals. Please review the following plan we discussed and let me know if I can assist you in the future.   Screening recommendations/referrals: Colonoscopy: no longer required  Recommended yearly ophthalmology/optometry visit for glaucoma screening and checkup Recommended yearly dental visit for hygiene and checkup  Vaccinations: Influenza vaccine: up to date Pneumococcal vaccine: up to date Tdap vaccine: due, last completed 03/16/08, Discuss with your local pharmacy Shingles vaccine: up to date   Covid-19: Discuss with your local pharmacy   Advanced directives: Please bring a copy of Living Will and/or Healthcare Power of Attorney for your chart.   Conditions/risks identified: see problem list  Next appointment: Follow up in one year for your annual wellness visit.   Preventive Care 63 Years and Older, Male Preventive care refers to lifestyle choices and visits with your health care provider that can promote health and wellness. What does preventive care include? A yearly physical exam. This is also called an annual well check. Dental exams once or twice a year. Routine eye exams. Ask your health care provider how often you should have your eyes checked. Personal lifestyle choices, including: Daily care of your teeth and gums. Regular physical activity. Eating a healthy diet. Avoiding tobacco and drug use. Limiting alcohol use. Practicing safe sex. Taking low doses of aspirin every day. Taking vitamin and mineral supplements as recommended by your health care provider. What happens during an annual well check? The services and screenings done by your health care provider during your annual well check will depend on your age, overall health, lifestyle risk factors, and family history of disease. Counseling  Your health care provider may ask you  questions about your: Alcohol use. Tobacco use. Drug use. Emotional well-being. Home and relationship well-being. Sexual activity. Eating habits. History of falls. Memory and ability to understand (cognition). Work and work Statistician. Screening  You may have the following tests or measurements: Height, weight, and BMI. Blood pressure. Lipid and cholesterol levels. These may be checked every 5 years, or more frequently if you are over 39 years old. Skin check. Lung cancer screening. You may have this screening every year starting at age 32 if you have a 30-pack-year history of smoking and currently smoke or have quit within the past 15 years. Fecal occult blood test (FOBT) of the stool. You may have this test every year starting at age 60. Flexible sigmoidoscopy or colonoscopy. You may have a sigmoidoscopy every 5 years or a colonoscopy every 10 years starting at age 82. Prostate cancer screening. Recommendations will vary depending on your family history and other risks. Hepatitis C blood test. Hepatitis B blood test. Sexually transmitted disease (STD) testing. Diabetes screening. This is done by checking your blood sugar (glucose) after you have not eaten for a while (fasting). You may have this done every 1-3 years. Abdominal aortic aneurysm (AAA) screening. You may need this if you are a current or former smoker. Osteoporosis. You may be screened starting at age 65 if you are at high risk. Talk with your health care provider about your test results, treatment options, and if necessary, the need for more tests. Vaccines  Your health care provider may recommend certain vaccines, such as: Influenza vaccine. This is recommended every year. Tetanus, diphtheria, and acellular pertussis (Tdap, Td) vaccine. You may need a Td booster every 10 years. Zoster vaccine. You may need this  after age 9. Pneumococcal 13-valent conjugate (PCV13) vaccine. One dose is recommended after age  38. Pneumococcal polysaccharide (PPSV23) vaccine. One dose is recommended after age 48. Talk to your health care provider about which screenings and vaccines you need and how often you need them. This information is not intended to replace advice given to you by your health care provider. Make sure you discuss any questions you have with your health care provider. Document Released: 03/29/2015 Document Revised: 11/20/2015 Document Reviewed: 01/01/2015 Elsevier Interactive Patient Education  2017 [place] Prevention in the Home Falls can cause injuries. They can happen to people of all ages. There are many things you can do to make your home safe and to help prevent falls. What can I do on the outside of my home? Regularly fix the edges of walkways and driveways and fix any cracks. Remove anything that might make you trip as you walk through a door, such as a raised step or threshold. Trim any bushes or trees on the path to your home. Use bright outdoor lighting. Clear any walking paths of anything that might make someone trip, such as rocks or tools. Regularly check to see if handrails are loose or broken. Make sure that both sides of any steps have handrails. Any raised decks and porches should have guardrails on the edges. Have any leaves, snow, or ice cleared regularly. Use sand or salt on walking paths during winter. Clean up any spills in your garage right away. This includes oil or grease spills. What can I do in the bathroom? Use night lights. Install grab bars by the toilet and in the tub and shower. Do not use towel bars as grab bars. Use non-skid mats or decals in the tub or shower. If you need to sit down in the shower, use a plastic, non-slip stool. Keep the floor dry. Clean up any water that spills on the floor as soon as it happens. Remove soap buildup in the tub or shower regularly. Attach bath mats securely with double-sided non-slip rug tape. Do not have throw  rugs and other things on the floor that can make you trip. What can I do in the bedroom? Use night lights. Make sure that you have a light by your bed that is easy to reach. Do not use any sheets or blankets that are too big for your bed. They should not hang down onto the floor. Have a firm chair that has side arms. You can use this for support while you get dressed. Do not have throw rugs and other things on the floor that can make you trip. What can I do in the kitchen? Clean up any spills right away. Avoid walking on wet floors. Keep items that you use a lot in easy-to-reach places. If you need to reach something above you, use a strong step stool that has a grab bar. Keep electrical cords out of the way. Do not use floor polish or wax that makes floors slippery. If you must use wax, use non-skid floor wax. Do not have throw rugs and other things on the floor that can make you trip. What can I do with my stairs? Do not leave any items on the stairs. Make sure that there are handrails on both sides of the stairs and use them. Fix handrails that are broken or loose. Make sure that handrails are as long as the stairways. Check any carpeting to make sure that it is firmly attached to the  stairs. Fix any carpet that is loose or worn. Avoid having throw rugs at the top or bottom of the stairs. If you do have throw rugs, attach them to the floor with carpet tape. Make sure that you have a light switch at the top of the stairs and the bottom of the stairs. If you do not have them, ask someone to add them for you. What else can I do to help prevent falls? Wear shoes that: Do not have high heels. Have rubber bottoms. Are comfortable and fit you well. Are closed at the toe. Do not wear sandals. If you use a stepladder: Make sure that it is fully opened. Do not climb a closed stepladder. Make sure that both sides of the stepladder are locked into place. Ask someone to hold it for you, if  possible. Clearly mark and make sure that you can see: Any grab bars or handrails. First and last steps. Where the edge of each step is. Use tools that help you move around (mobility aids) if they are needed. These include: Canes. Walkers. Scooters. Crutches. Turn on the lights when you go into a dark area. Replace any light bulbs as soon as they burn out. Set up your furniture so you have a clear path. Avoid moving your furniture around. If any of your floors are uneven, fix them. If there are any pets around you, be aware of where they are. Review your medicines with your doctor. Some medicines can make you feel dizzy. This can increase your chance of falling. Ask your doctor what other things that you can do to help prevent falls. This information is not intended to replace advice given to you by your health care provider. Make sure you discuss any questions you have with your health care provider. Document Released: 12/27/2008 Document Revised: 08/08/2015 Document Reviewed: 04/06/2014 Elsevier Interactive Patient Education  2017 [employer].

## 2021-03-15 ENCOUNTER — Emergency Department
Admission: EM | Admit: 2021-03-15 | Discharge: 2021-03-15 | Disposition: A | Discharge status: Home / Self Care | Payer: Medicare Other | Attending: Emergency Medicine | Admitting: Emergency Medicine

## 2021-03-15 ENCOUNTER — Encounter: Payer: Self-pay | Admitting: Emergency Medicine

## 2021-03-15 ENCOUNTER — Other Ambulatory Visit: Payer: Self-pay

## 2021-03-15 ENCOUNTER — Ambulatory Visit
Admission: EM | Admit: 2021-03-15 | Discharge: 2021-03-15 | Disposition: A | Discharge status: Other Acute Inpatient Hospital | Payer: Medicare Other

## 2021-03-15 ENCOUNTER — Emergency Department: Payer: Medicare Other

## 2021-03-15 DIAGNOSIS — Z96643 Presence of artificial hip joint, bilateral: Secondary | ICD-10-CM | POA: Diagnosis not present

## 2021-03-15 DIAGNOSIS — Z85828 Personal history of other malignant neoplasm of skin: Secondary | ICD-10-CM | POA: Insufficient documentation

## 2021-03-15 DIAGNOSIS — J36 Peritonsillar abscess: Secondary | ICD-10-CM | POA: Diagnosis not present

## 2021-03-15 DIAGNOSIS — Z20822 Contact with and (suspected) exposure to covid-19: Secondary | ICD-10-CM | POA: Diagnosis not present

## 2021-03-15 DIAGNOSIS — M47812 Spondylosis without myelopathy or radiculopathy, cervical region: Secondary | ICD-10-CM | POA: Diagnosis not present

## 2021-03-15 DIAGNOSIS — J029 Acute pharyngitis, unspecified: Secondary | ICD-10-CM

## 2021-03-15 DIAGNOSIS — I10 Essential (primary) hypertension: Secondary | ICD-10-CM | POA: Diagnosis not present

## 2021-03-15 DIAGNOSIS — J351 Hypertrophy of tonsils: Secondary | ICD-10-CM | POA: Diagnosis not present

## 2021-03-15 DIAGNOSIS — Z7901 Long term (current) use of anticoagulants: Secondary | ICD-10-CM | POA: Insufficient documentation

## 2021-03-15 DIAGNOSIS — R070 Pain in throat: Secondary | ICD-10-CM | POA: Diagnosis not present

## 2021-03-15 DIAGNOSIS — J392 Other diseases of pharynx: Secondary | ICD-10-CM | POA: Diagnosis not present

## 2021-03-15 DIAGNOSIS — R1310 Dysphagia, unspecified: Secondary | ICD-10-CM | POA: Diagnosis not present

## 2021-03-15 LAB — COMPREHENSIVE METABOLIC PANEL
ALT: 15 U/L (ref 0–44)
AST: 19 U/L (ref 15–41)
Albumin: 3.5 g/dL (ref 3.5–5.0)
Alkaline Phosphatase: 35 U/L — ABNORMAL LOW (ref 38–126)
Anion gap: 7 (ref 5–15)
BUN: 18 mg/dL (ref 8–23)
CO2: 27 mmol/L (ref 22–32)
Calcium: 9.1 mg/dL (ref 8.9–10.3)
Chloride: 107 mmol/L (ref 98–111)
Creatinine, Ser: 0.84 mg/dL (ref 0.61–1.24)
GFR, Estimated: >60 mL/min (ref >60)
Glucose, Bld: 108 mg/dL — ABNORMAL HIGH (ref 70–99)
Potassium: 4 mmol/L (ref 3.5–5.1)
Sodium: 141 mmol/L (ref 135–145)
Total Bilirubin: 0.8 mg/dL (ref 0.3–1.2)
Total Protein: 6.5 g/dL (ref 6.5–8.1)

## 2021-03-15 LAB — CBC WITH DIFFERENTIAL/PLATELET
Abs Immature Granulocytes: 0.03 10*3/uL (ref 0.00–0.07)
Basophils Absolute: 0.1 10*3/uL (ref 0.0–0.1)
Basophils Relative: 1 %
Eosinophils Absolute: 0.2 10*3/uL (ref 0.0–0.5)
Eosinophils Relative: 2 %
HCT: 39.1 % (ref 39.0–52.0)
Hemoglobin: 13.5 g/dL (ref 13.0–17.0)
Immature Granulocytes: 0 %
Lymphocytes Relative: 14 %
Lymphs Abs: 1.7 10*3/uL (ref 0.7–4.0)
MCH: 32.3 pg (ref 26.0–34.0)
MCHC: 34.5 g/dL (ref 30.0–36.0)
MCV: 93.5 fL (ref 80.0–100.0)
Monocytes Absolute: 1.1 10*3/uL — ABNORMAL HIGH (ref 0.1–1.0)
Monocytes Relative: 10 %
Neutro Abs: 8.6 10*3/uL — ABNORMAL HIGH (ref 1.7–7.7)
Neutrophils Relative %: 73 %
Platelets: 209 10*3/uL (ref 150–400)
RBC: 4.18 MIL/uL — ABNORMAL LOW (ref 4.22–5.81)
RDW: 12.2 % (ref 11.5–15.5)
WBC: 11.8 10*3/uL — ABNORMAL HIGH (ref 4.0–10.5)
nRBC: 0 % (ref 0.0–0.2)

## 2021-03-15 LAB — RESP PANEL BY RT-PCR (FLU A&B, COVID) ARPGX2
Influenza A by PCR: NEGATIVE
Influenza B by PCR: NEGATIVE
SARS Coronavirus 2 by RT PCR: NEGATIVE

## 2021-03-15 LAB — GROUP A STREP BY PCR: Group A Strep by PCR: NOT DETECTED

## 2021-03-15 MED ORDER — CLINDAMYCIN PALMITATE HCL 75 MG/5ML PO SOLR
450.0000 mg | Freq: Three times a day (TID) | ORAL | 0 refills | Status: AC
Start: 2021-03-15 — End: 2021-03-22

## 2021-03-15 MED ORDER — DEXAMETHASONE 0.5 MG/5ML PO SOLN: 4.0000 mg | Freq: Every day | ORAL | 0 refills | Status: AC

## 2021-03-15 MED ORDER — CLINDAMYCIN PALMITATE HCL 75 MG/5ML PO SOLR: 450.0000 mg | Freq: Three times a day (TID) | ORAL | 0 refills | Status: DC

## 2021-03-15 MED ORDER — DEXAMETHASONE 1 MG/ML PO CONC
10.0000 mg | Freq: Once | ORAL | Status: AC
  Administered 2021-03-15: 10 mg via ORAL
  Filled 2021-03-15: qty 10

## 2021-03-15 MED ORDER — CLINDAMYCIN PALMITATE HCL 75 MG/5ML PO SOLR
450.0000 mg | Freq: Once | ORAL | Status: AC
  Administered 2021-03-15: 450 mg via ORAL
  Filled 2021-03-15: qty 30

## 2021-03-15 MED ORDER — IOHEXOL 300 MG/ML  SOLN
75.0000 mL | Freq: Once | INTRAMUSCULAR | Status: AC | PRN
  Administered 2021-03-15: 75 mL via INTRAVENOUS

## 2021-03-15 MED ORDER — DEXAMETHASONE 0.5 MG/5ML PO SOLN: 4.0000 mg | Freq: Every day | ORAL | 0 refills | Status: DC

## 2021-03-15 NOTE — ED Triage Notes (Signed)
Pt states that he has had a sore throat x3 days that has made it difficult to talk and hurts to breath- pt was seen by an NP and was told he has a severely swollen throat

## 2021-03-15 NOTE — Discharge Instructions (Addendum)
You may continue using over-the-counter medications for any continued pain including: Ibuprofen (up to 800 mg every 8 hours), naproxen (up to 500 mg every 12 hours), or Tylenol (no more than 4 g in a 24-hour period)  While you are taking these antibiotics, please take a medication with active cultures and/or any yogurt that contains active cultures (activity).  You may need to continue these medications/yogurt for a day or 2 after antibiotics finish if any diarrhea occurs.

## 2021-03-15 NOTE — ED Triage Notes (Signed)
Pt here with sore throat difficulty swallowing x 3 days. Pain is on both sides, but swelling is on right side.

## 2021-03-15 NOTE — ED Provider Notes (Signed)
[facility] Emergency Department Provider Note   ____________________________________________   Event Date/Time   First MD Initiated Contact with Patient 03/15/21 [id number]     (approximate)  I have reviewed the triage vital signs and the nursing notes.   HISTORY  Chief Complaint Sore Throat    HPI Samuel Matthews is a 80 y.o. male who presents from urgent care with complaints of sore throat  LOCATION: Throat DURATION: 3 days prior to arrival TIMING: Worsening since onset SEVERITY: Severe QUALITY: Aching pain CONTEXT: Patient states that he began having sore throat approximate 3 days prior to arrival has been worsening since onset and recently progressed to difficulty swallowing and the sensation that his throat is closing up.  Patient was seen at urgent care earlier today and the swelling was severe enough they sent him to our emergency department for further evaluation MODIFYING FACTORS: Any p.o. 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   Orthostatic hypotension 01/18/2018   Overweight with body mass index (BMI) 25.0-29.9 12/16/2017   Advanced care planning/counseling discussion 07/22/2014   History of melanoma in situ    OA (osteoarthritis) of hip 02/03/2013   Pre-op evaluation 12/13/2012   Medicare annual wellness visit, subsequent 04/15/2011    Past Surgical History:  Procedure Laterality Date   CATARACT EXTRACTION Bilateral 10/2014   cataracts     CHEST TUBE INSERTION  1982   COLONOSCOPY  07/2003   diverticulosis, int hem ([place])   [place], 2014   Left calf   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 Authorizing Provider  clindamycin (CLEOCIN) 75 MG/5ML solution Take 30 mLs (450 mg total) by mouth 3 (three) times daily for 7 days. 03/15/21 03/22/21   Naaman Plummer, MD  dexamethasone (DECADRON) 0.5 MG/5ML solution Take 40 mLs (4 mg total) by mouth daily for 5 days. 03/15/21 03/20/21  Naaman Plummer, MD  HYDROcodone-acetaminophen (NORCO/VICODIN) 5-325 MG tablet Take 1-2 tablets by mouth every 4 (four) hours as needed for severe pain. 04/23/20   Edmisten, Ok Anis, PA  methocarbamol (ROBAXIN) 500 MG tablet Take 1 tablet (500 mg total) by mouth every 6 (six) hours as needed for muscle spasms. 04/23/20   Edmisten, Ok Anis, PA  rivaroxaban (XARELTO) 10 MG TABS tablet Take 1 tablet (10 mg total) by mouth daily with breakfast. Then take one 81 mg aspirin once a day for three weeks. Then discontinue aspirin. 04/23/20   Edmisten, Ok Anis, PA  traMADol (ULTRAM) 50 MG tablet Take 1-2 tablets (50-100 mg total) by mouth every 6 (six) hours as needed for moderate pain. 04/23/20   Edmisten, Ok Anis, PA    Allergies Lidocaine and Sulfa drugs cross reactors  Family History  Problem Relation Age of Onset   Coronary artery disease Mother 69   Hypertension Father    CVA Father    Healthy Sister    Healthy Brother    Diabetes Neg Hx    Cancer Neg Hx     Social History Social History   Tobacco Use   Smoking status: Never   Smokeless tobacco: Never  Vaping Use   Vaping Use: Never used  Substance Use Topics   Alcohol use: Yes    Comment: Occasional   Drug use: No    Review of Systems Constitutional: No fever/chills Eyes: No visual changes. ENT: Endorses sore throat and difficulty swallowing Cardiovascular: Denies chest pain. Respiratory: Denies shortness of breath. Gastrointestinal: No abdominal pain.  No nausea, no vomiting.  No diarrhea. Genitourinary: Negative for dysuria. Musculoskeletal: Negative for acute arthralgias Skin: Negative for rash. Neurological: Negative for headaches, weakness/numbness/paresthesias in any extremity Psychiatric: Negative for suicidal ideation/homicidal  ideation ____________________________________________ PHYSICAL EXAM:  VITAL SIGNS: ED Triage Vitals  Enc Vitals Group     BP 03/15/21 1205 (!) 143/88     Pulse Rate 03/15/21 1205 76     Resp 03/15/21 1205 16     Temp 03/15/21 1205 97.7 F (36.5 C)     Temp Source 03/15/21 1205 Oral     SpO2 03/15/21 1205 97 %     Weight --      Height --      Head Circumference --      Peak Flow --      Pain Score 03/15/21 1209 9     Pain Loc --      Pain Edu? --      Excl. in [place]? --    Constitutional: Alert and oriented. Well appearing and in no acute distress. Eyes: Conjunctivae are normal. PERRL. Head: Atraumatic. Nose: No congestion/rhinnorhea. Mouth/Throat: Mucous membranes  are moist. Erythematous posterior oropharynx w/ worsening edema to the R tonsil and hard palate Neck: No stridor.  Anterior cervical lymphadenopathy Cardiovascular: Grossly normal heart sounds.  Good peripheral circulation. Respiratory: Normal respiratory effort.  No retractions. Gastrointestinal: Soft and nontender. No distention. Musculoskeletal: No obvious deformities Neurologic:  Normal speech and language. No gross focal neurologic deficits are appreciated. Skin:  Skin is warm and dry. No rash noted. Psychiatric: Mood and affect are normal. Speech and behavior are normal.  ____________________________________________   LABS (all labs ordered are listed, but only abnormal results are displayed)  Labs Reviewed  CBC WITH DIFFERENTIAL/PLATELET - Abnormal; Notable for the following components:      Result Value   WBC 11.8 (*)    RBC 4.18 (*)    Neutro Abs 8.6 (*)    Monocytes Absolute 1.1 (*)    All other components within normal limits  COMPREHENSIVE METABOLIC PANEL - Abnormal; Notable for the following components:   Glucose, Bld 108 (*)    Alkaline Phosphatase 35 (*)    All other components within normal limits  GROUP A STREP BY PCR  RESP PANEL BY RT-PCR (FLU A&B, COVID) [id number]    ____________________________________________  RADIOLOGY  ED MD interpretation: CT soft tissue of the neck shows asymmetric enlargement of the right palatine tonsil and soft palate compatible with acute pharyngitis as well as a 1.8 x 1.5 cm collection concerning for a developing abscess to the right oropharynx  Official radiology report(s): CT Soft Tissue Neck W Contrast  Result Date: 03/15/2021 CLINICAL DATA:  Dysphagia. EXAM: CT NECK WITH CONTRAST TECHNIQUE: Multidetector CT imaging of the neck was performed using the standard protocol following the bolus administration of intravenous contrast. CONTRAST:  21mL OMNIPAQUE IOHEXOL 300 MG/ML  SOLN COMPARISON:  None. FINDINGS: Pharynx and larynx: The nasopharynx is clear. Asymmetric enlargement of the right palatine tonsil is present. The soft palate is edematous. Right oropharynx is heterogeneous. A peripherally enhancing low-density area measures 18 x 15 mm. Right parapharyngeal fat stranding is noted. Left oropharynx is clear. Tongue base is within normal limits. The airway is patent. Vallecula and epiglottis are within normal limits. Edema extends along the right aryepiglottic fold without additional focal lesion. Vocal cords are midline and symmetric. Trachea is clear. Salivary glands: The left submandibular gland is absent. Right submandibular gland ducts are within normal limits. Parotid glands and ducts are not normal bilaterally. Thyroid: Normal Lymph nodes: Asymmetric right submandibular and level 2 reactive lymph nodes are present. No necrotic nodes are present. Vascular: Atherosclerotic calcifications are present at the carotid bifurcations bilaterally without definite stenosis. Cervical vasculature is otherwise within normal limits. Limited intracranial: Within normal limits for age. Visualized orbits: Bilateral lens replacements are noted. Globes and orbits are otherwise unremarkable. Mastoids and visualized paranasal sinuses: Focal mucosal  thickening is present in the lateral right sphenoid sinus. Mild mucosal thickening is present in the at the anterior ethmoid air cells bilaterally. No fluid levels are present. Mastoid air cells are clear. Skeleton: Multilevel degenerative changes are present in the cervical spine with right greater than left facet spurring and foraminal narrowing. Acquired ankylosis noted at C4-5 with asymmetric right foraminal narrowing. No focal osseous lesions are present. Upper chest: The lung apices are clear. Thoracic inlet is within normal limits. IMPRESSION: 1. Asymmetric enlargement of the right palatine tonsil and soft palate compatible with acute pharyngitis. 2. 18 x 15 mm peripherally enhancing low-density area within the right oropharynx is concerning for a developing abscess. 3. Asymmetric right  submandibular and level 2 reactive lymph nodes. 4. Absent left submandibular gland. 5. Multilevel degenerative changes of the cervical spine. 6. Aortic Atherosclerosis (ICD10-I70.0). Electronically Signed   By: San Morelle M.D.   On: 03/15/2021 16:24    ____________________________________________   PROCEDURES  Procedure(s) performed (including Critical Care):  Procedures   ____________________________________________   INITIAL IMPRESSION / ASSESSMENT AND PLAN / ED COURSE  As part of my medical decision making, I reviewed the following data within the electronic medical record, if available:  Nursing notes reviewed and incorporated, Labs reviewed, EKG interpreted, Old chart reviewed, Radiograph reviewed and Notes from prior ED visits reviewed and incorporated     Patient a 80 year old male who presents for 3 days worsening shortness of breath and sent from urgent care with concerns for possible peritonsillar abscess.  Other differential diagnosis includes but is not limited to retropharyngeal abscess, sepsis, angioedema, or significant airway trauma.  Patient shows no signs of airway compromise on  physical exam nor does he have any evidence of stridor.  Patient does have erythematous hard and soft palate on the right aspect of his posterior oropharynx and CT evidence of likely PTA on the right side as well as significant pharyngitis.  Patient will be placed on Decadron/clindamycin with instructions to follow-up with ENT in the next 3 days if symptoms do not improve.  The patient has been reexamined and is ready to be discharged.  All diagnostic results have been reviewed and discussed with the patient/family.  Care plan has been outlined and the patient/family understands all current diagnoses, results, and treatment plans.  There are no new complaints, changes, or physical findings at this time.  All questions have been addressed and answered.  All medications, if any, that were given while in the emergency department or any that are being prescribed have been reviewed with the patient/family.  All side effects and adverse reactions have been explained.  Patient was instructed to, and agrees to follow-up with their primary care physician as well as return to the emergency department if any new or worsening symptoms develop.     ____________________________________________   FINAL CLINICAL IMPRESSION(S) / ED DIAGNOSES  Final diagnoses:  Peritonsillar abscess  Sore throat     ED Discharge Orders          Ordered    dexamethasone (DECADRON) 0.5 MG/5ML solution  Daily,   Status:  Discontinued        03/15/21 1640    clindamycin (CLEOCIN) 75 MG/5ML solution  3 times daily,   Status:  Discontinued        03/15/21 1640    clindamycin (CLEOCIN) 75 MG/5ML solution  3 times daily        03/15/21 1649    dexamethasone (DECADRON) 0.5 MG/5ML solution  Daily        03/15/21 1649             Note:  This document was prepared using Dragon voice recognition software and may include unintentional dictation errors.    Naaman Plummer, MD 03/15/21 2229

## 2021-03-15 NOTE — ED Provider Notes (Signed)
Samuel Matthews    CSN: [id number] Arrival date & time: 03/15/21  1052      History   Chief Complaint Chief Complaint  Patient presents with   Sore Throat    HPI Samuel Matthews is a 80 y.o. male.  Patient presents with 3-day history of sore throat and difficulty swallowing.  He denies fever, rash, shortness of breath, or other symptoms.  Treatment at home with Tylenol.  His medical history includes basal cell carcinoma, melanoma, diverticulosis, osteoarthritis.  The history is provided by the patient and medical records.   Past Medical History:  Diagnosis Date   Actinic keratosis    Basal cell carcinoma 12/07/2016   L nasal dorsum inferior   Basal cell carcinoma 11/25/2015   L postauricular area   Basal cell carcinoma 05/06/2015   R chest, L neck   Basal cell carcinoma 10/30/2014   L ant shoulder   Basal cell carcinoma 02/06/2020   L med posterior shoulder, EDC   Basal cell carcinoma of nose 2015   ([name])   Cancer [facility])    Hx: of squamous cell left calf   Complication of anesthesia    DR Nicole Kindred removed melanoma used lidocaine next day lightheaded, blood pressure low- ER- adm for 3 days due to hypotensiion - ? 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   right lateral calf     Patient Active Problem List   Diagnosis Date Noted   Primary osteoarthritis of right hip 04/22/2020   Atherosclerosis of aorta ([place]) 01/31/2019   Microhematuria 01/25/2019   Acquired renal cyst of right kidney 01/24/2018   Orthostatic hypotension 01/18/2018   Overweight with body mass index (BMI) 25.0-29.9 12/16/2017   Advanced care planning/counseling discussion 07/22/2014   History of melanoma in situ    OA (osteoarthritis) of hip 02/03/2013   Pre-op evaluation 12/13/2012   Medicare annual wellness visit, subsequent 04/15/2011    Past Surgical History:  Procedure Laterality Date   CATARACT EXTRACTION Bilateral 10/2014   cataracts     CHEST TUBE INSERTION  1982   COLONOSCOPY  07/2003   diverticulosis, int hem ([facility])   [place], 2014   Left calf   TOTAL HIP ARTHROPLASTY Left 02/03/2013   [name], MD   TOTAL HIP ARTHROPLASTY Right 04/22/2020   Procedure: TOTAL HIP ARTHROPLASTY ANTERIOR APPROACH;  Surgeon: [name], MD;  Location: [facility];  Service: Orthopedics;  Laterality: Right;  192min   TRIGGER FINGER RELEASE  2001       Home Medications    Prior to  Admission medications   Medication Sig Start Date End Date Taking? Authorizing Provider  HYDROcodone-acetaminophen (NORCO/VICODIN) 5-325 MG tablet Take 1-2 tablets by mouth every 4 (four) hours as needed for severe pain. 04/23/20   Edmisten, Ok Anis, PA  methocarbamol (ROBAXIN) 500 MG tablet Take 1 tablet (500 mg total) by mouth every 6 (six) hours as needed for muscle spasms. 04/23/20   Edmisten, Ok Anis, PA  rivaroxaban (XARELTO) 10 MG TABS tablet Take 1 tablet (10 mg total) by mouth daily with breakfast. Then take one 81 mg aspirin once a day for three weeks. Then discontinue aspirin. 04/23/20   Edmisten, Ok Anis, PA  traMADol (ULTRAM) 50 MG tablet Take 1-2 tablets (50-100 mg total) by mouth every 6 (six) hours as needed for moderate pain. 04/23/20    Edmisten, Ok Anis, PA    Family History Family History  Problem Relation Age of Onset   Coronary artery disease Mother 38   Hypertension Father    CVA Father    Healthy Sister    Healthy Brother    Diabetes Neg Hx    Cancer Neg Hx     Social History Social History   Tobacco Use   Smoking status: Never   Smokeless tobacco: Never  Vaping Use   Vaping Use: Never used  Substance Use Topics   Alcohol use: Yes    Comment: Occasional   Drug use: No     Allergies   Lidocaine and Sulfa drugs cross reactors   Review of Systems Review of Systems  Constitutional:  Negative for chills and fever.  HENT:  Positive for sore throat and trouble swallowing. Negative for ear pain.   Respiratory:  Negative for cough and shortness of breath.   Cardiovascular:  Negative for chest pain and palpitations.  Skin:  Negative for color change and rash.  All other systems reviewed and are negative.   Physical Exam Triage Vital Signs ED Triage Vitals  Enc Vitals Group     BP      Pulse      Resp      Temp      Temp src      SpO2      Weight      Height      Head Circumference      Peak Flow      Pain Score      Pain Loc      Pain Edu?      Excl. in [place]?    No data found.  Updated Vital Signs BP 120/68    Pulse 81    Temp 98.2 F (36.8 C)    Resp 16    SpO2 97%   Visual Acuity Right Eye Distance:   Left Eye Distance:   Bilateral Distance:    Right Eye Near:   Left Eye Near:    Bilateral Near:     Physical Exam Vitals and nursing note reviewed.  Constitutional:      General: He is not in acute distress.    Appearance: Normal appearance. He is well-developed.  HENT:     Mouth/Throat:     Mouth: Mucous membranes are moist.     Pharynx: Posterior oropharyngeal erythema present.     Tonsils: Tonsillar abscess present.     Comments: Large erythematous area of swelling in posterior pharynx. Eyes:     Conjunctiva/sclera: Conjunctivae normal.  Cardiovascular:      Rate and Rhythm: Normal rate and regular rhythm.  Heart sounds: Normal heart sounds.  Pulmonary:     Effort: Pulmonary effort is normal. No respiratory distress.     Breath sounds: Normal breath sounds.     Comments: Good air movement.  Musculoskeletal:     Cervical back: Neck supple.  Skin:    General: Skin is warm and dry.  Neurological:     Mental Status: He is alert.  Psychiatric:        Mood and Affect: Mood normal.        Behavior: Behavior normal.     UC Treatments / Results  Labs (all labs ordered are listed, but only abnormal results are displayed) Labs Reviewed - No data to display  EKG   Radiology No results found.  Procedures Procedures (including critical care time)  Medications Ordered in UC Medications - No data to display  Initial Impression / Assessment and Plan / UC Course  I have reviewed the triage vital signs and the nursing notes.  Pertinent labs & imaging results that were available during my care of the patient were reviewed by me and considered in my medical decision making (see chart for details).  Peritonsillar abscess.  No respiratory compromise at this time.  Sending patient to the ED for evaluation.  His wife is with him and will accompany him to the ED.   Final Clinical Impressions(s) / UC Diagnoses   Final diagnoses:  Tonsillar abscess     Discharge Instructions      Go to the emergency department for evaluation of a tonsillar abscess.        ED Prescriptions   None    PDMP not reviewed this encounter.   Sharion Balloon, NP 03/15/21 1148

## 2021-03-15 NOTE — ED Notes (Signed)
Patient to CT at this time

## 2021-03-15 NOTE — Discharge Instructions (Addendum)
Go to the emergency department for evaluation of a tonsillar abscess.

## 2021-03-15 NOTE — ED Provider Notes (Signed)
°  Emergency Medicine Provider Triage Evaluation Note  Samuel Matthews , a 80 y.o.male,  was evaluated in triage.  Pt complains of sore throat x 3 days.  He went to urgent care earlier this morning and the nurse practitioner saw him thought that he needed a CT scan to rule out pharyngeal abscess.  Denies fever/chills, chest pain, shortness of breath, congestion, ear pain, or abdominal pain.   Review of Systems  Positive: Sore throat, difficulty swallowing. Negative: Denies fever, chest pain, vomiting  Physical Exam   Vitals:   03/15/21 1205  BP: (!) 143/88  Pulse: 76  Resp: 16  Temp: 97.7 F (36.5 C)  SpO2: 97%   Gen:   Awake, no distress   Resp:  Normal effort  MSK:   Moves extremities without difficulty  Other:  Significant edema posterior to the uvula.  Midline.  Medical Decision Making  Given the patient's initial medical screening exam, the following diagnostic evaluation has been ordered. The patient will be placed in the appropriate treatment space, once one is available, to complete the evaluation and treatment. I have discussed the plan of care with the patient and I have advised the patient that an ED physician or mid-level practitioner will reevaluate their condition after the test results have been received, as the results may give them additional insight into the type of treatment they may need.    Diagnostics: Labs, CT neck, strep PCR, respiratory panel.  Treatments: none immediately   Teodoro Spray, [place] 03/15/21 1221    Lavonia Drafts, MD 03/15/21 1233

## 2021-03-19 DIAGNOSIS — J36 Peritonsillar abscess: Secondary | ICD-10-CM | POA: Diagnosis not present

## 2021-06-24 ENCOUNTER — Encounter: Payer: Self-pay | Admitting: Urology

## 2021-06-24 ENCOUNTER — Ambulatory Visit (INDEPENDENT_AMBULATORY_CARE_PROVIDER_SITE_OTHER): Payer: Medicare Other | Admitting: Family

## 2021-06-24 ENCOUNTER — Encounter: Payer: Self-pay | Admitting: Family

## 2021-06-24 ENCOUNTER — Ambulatory Visit (INDEPENDENT_AMBULATORY_CARE_PROVIDER_SITE_OTHER): Payer: Medicare Other | Admitting: Urology

## 2021-06-24 VITALS — BP 137/71 | HR 94 | Ht 69.0 in | Wt 194.0 lb

## 2021-06-24 VITALS — BP 137/68 | HR 74 | Temp 98.0°F | Resp 16 | Ht 69.0 in | Wt 200.2 lb

## 2021-06-24 DIAGNOSIS — Z87898 Personal history of other specified conditions: Secondary | ICD-10-CM

## 2021-06-24 DIAGNOSIS — N138 Other obstructive and reflux uropathy: Secondary | ICD-10-CM

## 2021-06-24 DIAGNOSIS — N401 Enlarged prostate with lower urinary tract symptoms: Secondary | ICD-10-CM | POA: Diagnosis not present

## 2021-06-24 DIAGNOSIS — R339 Retention of urine, unspecified: Secondary | ICD-10-CM | POA: Insufficient documentation

## 2021-06-24 LAB — BLADDER SCAN AMB NON-IMAGING

## 2021-06-24 MED ORDER — TAMSULOSIN HCL 0.4 MG PO CAPS: 0.4000 mg | ORAL_CAPSULE | Freq: Every day | ORAL | 3 refills | Status: DC

## 2021-06-24 NOTE — Progress Notes (Signed)
? ?Established Patient Office Visit ? ?Subjective:  ?Patient ID: Samuel Matthews, male    DOB: 20-Feb-1941  Age: 81 y.o. MRN: [id number] ? ?CC:  ?Chief Complaint  ?Patient presents with  ? Dysuria  ?  Feels like bladder is full, and unable to empty X 4 days.   ? ? ?HPI ?Samuel Matthews is here today with concerns.  ? ?For the last four days unable to void. States has not peed at all since Friday, just a little dribble. Pressure is very painful and can only get out a little dribble if he presses on his stomach. Up every 20 minutes trying to get the urine out and due to pressure very uncomfortable.  ? ?Was referred to urology maybe five years ago for hematuria, trace however he has had this since a child.  ? ?Pain currently around 8/9-10.  ? ?No fever or chills.  ?This am woke up with left bil abd pain. ? ?Lab Results  ?Component Value Date  ? PSA 2.90 07/04/2015  ? PSA 2.4 04/05/2009  ? ? ?  ? ?Past Medical History:  ?Diagnosis Date  ? Actinic keratosis   ? 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 L nasal dorsum inferior  ? Basal cell carcinoma 11/25/2015  ? L postauricular area  ? Basal cell carcinoma 05/06/2015  ? R chest, L neck  ? Basal cell carcinoma 10/30/2014  ? L ant shoulder  ? Basal cell carcinoma 02/06/2020  ? L med posterior shoulder, EDC  ? Basal cell carcinoma of nose 2015  ? ([name])  ? Cancer [facility])   ? Hx: of squamous cell left calf  ? Complication of anesthesia   ? DR Nicole Kindred removed melanoma used lidocaine next day lightheaded, blood pressure low- ER- adm for 3 days due to hypotensiion - ? lidocaine- melanoma removed at Dr Les Pou office- [place] in [place]   ? Diverticulosis 2005  ? by colonoscopy  ? History of chicken pox   ? 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 Spinal upper back([facility], Dr [name])  ? 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 R post shoulder  ? 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 R posterior shoulder/WLE  ? History of pneumothorax 1982  ? rib  fracture, from a fall  ? History of squamous cell carcinoma of skin 1993  ? Left calf  ? Lower back pain 2012  ? thought HNP, improved with PT  ? 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 Left spinal upper back. Exc 09/18/20  ? Osteoarthritis of left hip   ? Pneumothorax, acute   ? Hx: of  ? 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 right lateral calf  ? ? ?Past Surgical History:  ?Procedure Laterality Date  ? CATARACT EXTRACTION Bilateral 10/2014  ? cataracts    ? CHEST TUBE INSERTION  1982  ? COLONOSCOPY  07/2003  ? diverticulosis, int hem ([facility])  ? [place]  ? LASIK    ? SKIN CANCER EXCISION  1993, 2014  ? Left calf  ? 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 [name], MD  ? TOTAL HIP ARTHROPLASTY Right 04/22/2020  ? Procedure: TOTAL HIP ARTHROPLASTY ANTERIOR APPROACH;  Surgeon: [name], MD;  Location[facility];  Service: Orthopedics;  Laterality: Right;  150mn  ? TRIGGER FINGER RELEASE  2001  ? ? ?Family History  ?Problem Relation Age of Onset  ? Coronary artery disease  Mother 18  ? Hypertension Father   ? CVA Father   ? Healthy Sister   ? Healthy Brother   ? Diabetes Neg Hx   ? Cancer Neg Hx   ? ? ?Social History  ? ?Socioeconomic History  ? Marital status: Married  ?  Spouse name: Not on file  ? Number of children: Not on file  ? Years of education: Not on file  ? Highest education level: Not on file  ?Occupational History  ? Occupation: Retired  ?Tobacco Use  ? Smoking status: Never  ? Smokeless tobacco: Never  ?Vaping Use  ? Vaping Use: Never used  ?Substance and Sexual Activity  ? Alcohol use: Yes  ?  Comment: Occasional  ? Drug use: No  ? Sexual activity: Yes  ?Other Topics Concern  ? Not on file  ?Social History Narrative  ? Caffeine: 2-3 cups coffee/day  ? Lives with wife in [place], son in [place]  ? 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 Edu: 4 yrs college  ? Activity: Plays golf 3d/wk at [facility], gym twice weekly.   ?  Diet: fruits/vegetables daily, 2x/wk red meat, fish 1x/wk  ? ?Social Determinants of Health  ? ?Financial Resource Strain: Low Risk   ? Difficulty of Paying Living Expenses: Not hard at all  ?Food Insecurity: No Food Insecurity  ? Worried About Charity fundraiser in the Last Year: Never true  ? Ran Out of Food in the Last Year: Never true  ?Transportation Needs: No Transportation Needs  ? Lack of Transportation (Medical): No  ? Lack of Transportation (Non-Medical): No  ?Physical Activity: Sufficiently Active  ? Days of Exercise per Week: 3 days  ? Minutes of Exercise per Session: 150+ min  ?Stress: No Stress Concern Present  ? Feeling of Stress : Not at all  ?Social Connections: Moderately Integrated  ? Frequency of Communication with Friends and Family: More than three times a week  ? Frequency of Social Gatherings with Friends and Family: More than three times a week  ? Attends Religious Services: Never  ? Active Member of Clubs or Organizations: Yes  ? Attends Archivist Meetings: More than 4 times per year  ? Marital Status: Married  ?Intimate Partner Violence: Not At Risk  ? Fear of Current or Ex-Partner: No  ? Emotionally Abused: No  ? Physically Abused: No  ? Sexually Abused: No  ? ? ?Outpatient Medications Prior to Visit  ?Medication Sig Dispense Refill  ? HYDROcodone-acetaminophen (NORCO/VICODIN) 5-325 MG tablet Take 1-2 tablets by mouth every 4 (four) hours as needed for severe pain. 42 tablet 0  ? methocarbamol (ROBAXIN) 500 MG tablet Take 1 tablet (500 mg total) by mouth every 6 (six) hours as needed for muscle spasms. 40 tablet 0  ? rivaroxaban (XARELTO) 10 MG TABS tablet Take 1 tablet (10 mg total) by mouth daily with breakfast. Then take one 81 mg aspirin once a day for three weeks. Then discontinue aspirin. 21 tablet 0  ? traMADol (ULTRAM) 50 MG tablet Take 1-2 tablets (50-100 mg total) by mouth every 6 (six) hours as needed for moderate pain. 40 tablet 0  ? ?No facility-administered  medications prior to visit.  ? ? ?Allergies  ?Allergen Reactions  ? Lidocaine Other (See Comments)  ?  Dropped blood pressures severely  ? Sulfa Drugs Cross Reactors Hives  ? ? ?ROS ?Review of Systems  ?Constitutional:  Negative for chills, fatigue,  fever and unexpected weight change.  ?Eyes:  Negative for visual disturbance.  ?Respiratory:  Negative for shortness of breath.   ?Cardiovascular:  Negative for chest pain.  ?Gastrointestinal:  Positive for abdominal pain (lower abdominal pain with bladder). Anal bleeding: suprapubic pelvic pain on palpation. ?Genitourinary:  Positive for decreased urine volume, difficulty urinating (unable to void x five days) and urgency. Negative for dysuria, flank pain, frequency, penile discharge, penile swelling, scrotal swelling and testicular pain.  ?Skin:  Negative for rash.  ?Neurological:  Negative for dizziness and headaches.  ? ?  ?Objective:  ?  ?Physical Exam ?Exam conducted with a chaperone present.  ?Constitutional:   ?   General: He is not in acute distress. ?   Appearance: Normal appearance. He is normal weight. He is not ill-appearing, toxic-appearing or diaphoretic.  ?Pulmonary:  ?   Effort: Pulmonary effort is normal.  ?Abdominal:  ?   General: Abdomen is flat.  ?   Tenderness: There is abdominal tenderness (pelvic suprapubic severe tenderness on palpation with bladder full).  ?Genitourinary: ?   Pubic Area: No rash.   ?   Penis: Normal and circumcised.   ?   Testes: Normal.  ?   Epididymis:  ?   Right: Normal.  ?   Left: Normal.  ?Musculoskeletal:  ?   Comments: No flank pain on percussion palpation ?  ?Neurological:  ?   Mental Status: He is alert.  ? ? ?BP 137/68   Pulse 74   Temp 98 ?F (36.7 ?C)   Resp 16   Ht '5\' 9"'$  (1.753 m)   Wt 200 lb 4 oz (90.8 kg)   SpO2 97%   BMI 29.57 kg/m?  ?Wt Readings from Last 3 Encounters:  ?06/24/21 194 lb (88 kg)  ?06/24/21 200 lb 4 oz (90.8 kg)  ?03/11/21 192 lb (87.1 kg)  ? ? ? ?Health Maintenance Due  ?Topic Date Due  ?  COVID-19 Vaccine (4 - Booster for Pfizer series) 04/16/2020  ? ? ?There are no preventive care reminders to display for this patient. ? ?Lab Results  ?Component Value Date  ? TSH 0.915 01/19/2018  ? ?Lab Results  ?[place]

## 2021-06-24 NOTE — Assessment & Plan Note (Addendum)
Verbal consent obtained prior to performing cath, ?Steroid cleaner applied to perianal area as well as betadine applied prior to administration of catheter.  ?Foley catheter inserted via ureter and yellow urine expressed totally 1280 ml. Once urinary stream depleted, slowly retracted from ureter and slight blood at end of catheter. No penile discharge. ? pt tolerated procedure well, and stated immediate relief at release of urine.  ? ? ?

## 2021-06-24 NOTE — Patient Instructions (Signed)
A referral was placed today for urology.  ?Please let us know if you have not heard back within 1 week about your referral. ? ?

## 2021-06-24 NOTE — Progress Notes (Signed)
? ?06/24/21 ?3:34 PM  ? ?Samuel Matthews ?Jul 31, 1940 ?[id number] ? ?CC: Urinary retention ? ?HPI: ?81 year old very healthy male who presents today with urinary retention.  He was seen by his PCP this morning with lower abdominal pain and inability to void for at least 2 to 3 days and they performed a straight catheterization with over a liter of urine output.  He has been unable to void the rest of the day and was added to clinic as an urgent visit this afternoon.  PVR in clinic is elevated at 550 mL. ? ?He denies significant urinary symptoms prior to his onset of retention over the last few days.  He normally voids with a good stream and denies any sensation of incomplete emptying.  He had a similar episode over a year ago associated with some increased alcohol intake of some weak stream and difficulty voiding, but when he woke up the next morning he was able to void.  He denies any gross hematuria or dysuria.  PSA was last checked in 2017 and was normal at 2.9. ? ?Prostate measures 80 g on CT from 2020 ? ? ?PMH: ?Past Medical History:  ?Diagnosis Date  ? Actinic keratosis   ? 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 L nasal dorsum inferior  ? Basal cell carcinoma 11/25/2015  ? L postauricular area  ? Basal cell carcinoma 05/06/2015  ? R chest, L neck  ? Basal cell carcinoma 10/30/2014  ? L ant shoulder  ? Basal cell carcinoma 02/06/2020  ? L med posterior shoulder, EDC  ? Basal cell carcinoma of nose 2015  ? ([name])  ? Cancer [facility])   ? Hx: of squamous cell left calf  ? Complication of anesthesia   ? DR Nicole Kindred removed melanoma used lidocaine next day lightheaded, blood pressure low- ER- adm for 3 days due to hypotensiion - ? lidocaine- melanoma removed at Dr Les Pou office- [place] in [place]   ? Diverticulosis 2005  ? by colonoscopy  ? History of chicken pox   ? 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 Spinal upper back([facility], Dr [name])  ? 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 R  post shoulder  ? 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 R posterior shoulder/WLE  ? History of pneumothorax 1982  ? rib fracture, from a fall  ? History of squamous cell carcinoma of skin 1993  ? Left calf  ? Lower back pain 2012  ? thought HNP, improved with PT  ? 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 Left spinal upper back. Exc 09/18/20  ? Osteoarthritis of left hip   ? Pneumothorax, acute   ? Hx: of  ? 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 right lateral calf  ? ? ?Surgical History: ?Past Surgical History:  ?Procedure Laterality Date  ? CATARACT EXTRACTION Bilateral 10/2014  ? cataracts    ? CHEST TUBE INSERTION  1982  ? COLONOSCOPY  07/2003  ? diverticulosis, int hem ([facility])  ? [place]  ? LASIK    ? SKIN CANCER EXCISION  1993, 2014  ? Left calf  ? 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 [name], MD  ? TOTAL HIP ARTHROPLASTY Right 04/22/2020  ? Procedure: TOTAL HIP ARTHROPLASTY ANTERIOR APPROACH;  Surgeon: [name], MD;  [facility];  Service: Orthopedics;  Laterality: Right;  157mn  ? TRIGGER FINGER RELEASE  2001  ? ? ? ? ?  Family History: ?Family History  ?Problem Relation Age of Onset  ? Coronary artery disease Mother 6  ? Hypertension Father   ? CVA Father   ? Healthy Sister   ? Healthy Brother   ? Diabetes Neg Hx   ? Cancer Neg Hx   ? ? ?Social History:  reports that he has never smoked. He has never used smokeless tobacco. He reports current alcohol use. He reports that he does not use drugs. ? ?Physical Exam: ?BP 137/71 (BP Location: Left Arm, Patient Position: Sitting, Cuff Size: Large)   Pulse 94   Ht '5\' 9"'$  (1.753 m)   Wt 194 lb (88 kg)   BMI 28.65 kg/m?   ? ?Constitutional:  Alert and oriented, No acute distress. ?Cardiovascular: No clubbing, cyanosis, or edema. ?Respiratory: Normal respiratory effort, no increased work of breathing. ?GI: Abdomen is soft, nontender, nondistended, no abdominal masses ?GU: widely patent meatus ?DRE: 80 g, smooth, no nodules  or masses ? ?Laboratory Data: ?Reviewed ? ?Pertinent Imaging: ?I have personally viewed and interpreted the CT urogram from November 2020 showing no hydronephrosis, empty bladder, and prostate measures 80 g. ? ?Assessment & Plan:   ?81 year old male who developed acute retention over the last few days and was straight catheterized for over a liter by his PCP this morning.  He has been unable to void since that time and was added to clinic as an urgent visit this afternoon, and a Foley catheter was placed with return of 500 mL of yellow urine.  He was prescribed Flomax by PCP, and took the first dose this afternoon. ? 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 ? ?I recommended close follow-up for voiding trial next week, and agree with starting Flomax.  We briefly discussed outlet procedures like HOLEP if he were to have persistent incomplete bladder emptying despite Flomax. ? ?RTC next week for Foley removal and PVR/voiding trial ?Continue Flomax ? ?Nickolas Madrid, MD ?06/24/2021 ? ?[facility] ?[address], [address] ?[place], [place] [address] ?[phone] ? ? ?

## 2021-06-24 NOTE — Assessment & Plan Note (Addendum)
Urine culture ordered pending results.  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 Ordered psa, bmp and cbc as well, also pending.  ?RX for flomax 0.4 mg once daily.  ?Pt advised if prior to seeing urology if again unable to void will need more urgent assessment.  ? ?Total time in office with acute assessment physical exam and insertion of straight cath with collection of urine culture totalled 38 minutes in office.  ?

## 2021-06-25 LAB — URINE CULTURE
MICRO NUMBER:: 13248011
Result:: NO GROWTH
SPECIMEN QUALITY:: ADEQUATE

## 2021-06-25 NOTE — Progress Notes (Signed)
Simple Catheter Placement ? ?Due to urinary retention patient is present today for a foley cath placement.  Patient was cleaned and prepped in a sterile fashion with betadine. A 18FR Coude foley catheter was inserted, urine return was noted 569m, urine was dark yellow in color. The balloon was filled with 10cc of sterile water.  A leg bag was attached for drainage. Patient declined night bag. Patient was given instruction on proper catheter care. Patient tolerated well, no complications were noted. ? ?Performed by: OGordy Clement CItasca ? ?Additional notes/ Follow up: RTC for voiding trial as scheduled.  ? ?

## 2021-07-02 ENCOUNTER — Ambulatory Visit: Payer: BLUE CROSS/BLUE SHIELD | Admitting: Urology

## 2021-07-03 ENCOUNTER — Ambulatory Visit (INDEPENDENT_AMBULATORY_CARE_PROVIDER_SITE_OTHER): Payer: BLUE CROSS/BLUE SHIELD | Admitting: Physician Assistant

## 2021-07-03 ENCOUNTER — Ambulatory Visit (INDEPENDENT_AMBULATORY_CARE_PROVIDER_SITE_OTHER): Payer: Medicare Other | Admitting: Urology

## 2021-07-03 DIAGNOSIS — N138 Other obstructive and reflux uropathy: Secondary | ICD-10-CM

## 2021-07-03 DIAGNOSIS — N401 Enlarged prostate with lower urinary tract symptoms: Secondary | ICD-10-CM | POA: Diagnosis not present

## 2021-07-03 DIAGNOSIS — R339 Retention of urine, unspecified: Secondary | ICD-10-CM | POA: Diagnosis not present

## 2021-07-03 DIAGNOSIS — Z87898 Personal history of other specified conditions: Secondary | ICD-10-CM

## 2021-07-03 LAB — BLADDER SCAN AMB NON-IMAGING

## 2021-07-03 MED ORDER — CEPHALEXIN 250 MG PO CAPS
500.0000 mg | ORAL_CAPSULE | Freq: Once | ORAL | Status: AC
  Administered 2021-07-03: 500 mg via ORAL

## 2021-07-03 NOTE — Patient Instructions (Signed)

## 2021-07-03 NOTE — Progress Notes (Signed)
? ?  07/03/2021 ?3:32 PM  ? ?Samuel Matthews ?12/31/1940 ?[id number] ? ?Reason for visit: Follow up BPH and urinary retention ? ?HPI: ?Healthy 81 year old male who I saw on 06/24/2021 for urinary retention with inability to void and PVR of 550 mL.  He denied any significant urinary symptoms prior to developing retention.  He was started on Flomax, and Foley catheter was placed.  PSA was normal at 2.9 in 2017, and DRE at our last visit showed an 80 g smooth prostate with no nodules or masses.  We reviewed the AUA guidelines again today that do not recommend routine PSA screening in men over age 24, and that PSA will likely be elevated with recent catheter placement. ? ?His Foley catheter was removed this morning, Keflex given for prophylaxis. ? ?He has been voiding spontaneously during the day today, and his stream continues to improve.  Bladder scan to 30 mL, but has not voided in the last few hours, no urge to void at this time. ? ?I recommended continuing the Flomax, and return precautions were discussed extensively regarding worsening urinary symptoms, fevers or gross hematuria.  I also recommended close follow-up in 1 to 2 months for PVR and symptom check ? ?Billey Co, MD ? ?[facility] ?[address], [address] ?[place], [place] [address] ?[phone] ? ?

## 2021-07-03 NOTE — Progress Notes (Signed)
Catheter Removal ? ?Patient is present today for a catheter removal.  68m of water was drained from the balloon. A 18FR foley cath was removed from the bladder no complications were noted . Patient tolerated well. ? ?Performed by: JGaspar ColaCMA  ? ?Follow up/ Additional notes: This  afternoon  ?

## 2021-07-17 ENCOUNTER — Encounter: Payer: Self-pay | Admitting: Family

## 2021-07-17 ENCOUNTER — Ambulatory Visit (INDEPENDENT_AMBULATORY_CARE_PROVIDER_SITE_OTHER): Payer: Medicare Other | Admitting: Family

## 2021-07-17 ENCOUNTER — Ambulatory Visit
Admission: RE | Admit: 2021-07-17 | Discharge: 2021-07-17 | Disposition: A | Discharge status: Home / Self Care | Payer: Medicare Other | Source: Ambulatory Visit | Attending: Family | Admitting: Family

## 2021-07-17 VITALS — BP 118/60 | HR 71 | Temp 98.4°F | Ht 69.0 in | Wt 194.0 lb

## 2021-07-17 DIAGNOSIS — M79651 Pain in right thigh: Secondary | ICD-10-CM | POA: Diagnosis not present

## 2021-07-17 DIAGNOSIS — S60419A Abrasion of unspecified finger, initial encounter: Secondary | ICD-10-CM

## 2021-07-17 DIAGNOSIS — S60511A Abrasion of right hand, initial encounter: Secondary | ICD-10-CM | POA: Diagnosis not present

## 2021-07-17 MED ORDER — MUPIROCIN 2 % EX OINT: 1.0000 "application " | TOPICAL_OINTMENT | Freq: Two times a day (BID) | CUTANEOUS | 0 refills | Status: AC

## 2021-07-17 MED ORDER — METHOCARBAMOL 500 MG PO TABS: ORAL_TABLET | ORAL | 0 refills | Status: DC

## 2021-07-17 NOTE — Patient Instructions (Signed)
Try some tylenol for leg pain ?Also sending in muscle relaxer ?Can try over the counter voltaren gel  ? ?Due to recent changes in healthcare laws, you may see results of your imaging and/or laboratory studies on MyChart before I have had a chance to review them.  I understand that in some cases there may be results that are confusing or concerning to you. Please understand that not all results are received at the same time and often I may need to interpret multiple results in order to provide you with the best plan of care or course of treatment. Therefore, I ask that you please give me 2 business days to thoroughly review all your results before contacting my office for clarification. Should we see a critical lab result, you will be contacted sooner.  ? ?It was a pleasure seeing you today! Please do not hesitate to reach out with any questions and or concerns. ? ?Regards,  ? ?Armarion Greek ?FNP-C ? ?

## 2021-07-17 NOTE — Progress Notes (Signed)
? ?Established Patient Office Visit ? ?Subjective:  ?Patient ID: Samuel Matthews, male    DOB: 01/13/1941  Age: 81 y.o. MRN: [id number] ? ?CC:  ?Chief Complaint  ?Patient presents with  ? Leg Pain  ?  C/o R leg pain starting at mid anterior thigh.  Started about 1 wk ago.  Tried heat therapy and OTC topical pain meds, not helpful.   ? ? ?HPI ?Samuel Matthews is here today with concerns.  ? ?Was wearing a urinary catheter for 8 days and thinks he may have changed the way he was walking while it was there, and so since, when he gets up to the bathroom he feels weakness in the right leg. The more he walks he sees improvement, but if sits down for 20 minutes to half hour it starts up again. He states the tenderness starts at the top of right lower groin, right lower hip and goes anteriorly to about the right mid knee. Tried bengay without much relief. The pain is sharp and stabbing. No sciatic pain.  ? ?No swelling or redness of the leg.  ?Sx started maybe about six days ago.  ?Going up and down stairs will also aggravate the area.  ? ?Past Medical History:  ?Diagnosis Date  ? Actinic keratosis   ? 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 L nasal dorsum inferior  ? Basal cell carcinoma 11/25/2015  ? L postauricular area  ? Basal cell carcinoma 05/06/2015  ? R chest, L neck  ? Basal cell carcinoma 10/30/2014  ? L ant shoulder  ? Basal cell carcinoma 02/06/2020  ? L med posterior shoulder, [facility]  ? Basal cell carcinoma of nose 2015  ? ([name])  ? Cancer [facility])   ? Hx: of squamous cell left calf  ? Complication of anesthesia   ? DR Nicole Kindred removed melanoma used lidocaine next day lightheaded, blood pressure low- ER- adm for 3 days due to hypotensiion - ? lidocaine- melanoma removed at Dr Les Pou office- [place]   ? Diverticulosis 2005  ? by colonoscopy  ? History of chicken pox   ? 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 Spinal upper back([facility], Dr [name])  ? History of melanoma in situ  06/05/2013  ? R post shoulder  ? 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 R posterior shoulder/[facility]  ? History of pneumothorax 1982  ? rib fracture, from a fall  ? History of squamous cell carcinoma of skin 1993  ? Left calf  ? Lower back pain 2012  ? thought HNP, improved with PT  ? 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 Left spinal upper back. Exc 09/18/20  ? Osteoarthritis of left hip   ? Pneumothorax, acute   ? Hx: of  ? 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 right lateral calf  ? ? ?Past Surgical History:  ?Procedure Laterality Date  ? CATARACT EXTRACTION Bilateral 10/2014  ? cataracts    ? CHEST TUBE INSERTION  1982  ? COLONOSCOPY  07/2003  ? diverticulosis, int hem ([place])  ? [place]  ? LASIK    ? SKIN CANCER EXCISION  1993, 2014  ? Left calf  ? 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 [name], MD  ? TOTAL HIP ARTHROPLASTY Right 04/22/2020  ? Procedure: TOTAL HIP ARTHROPLASTY ANTERIOR APPROACH;  Surgeon: [name], MD;  Location: [facility];  Service: Orthopedics;  Laterality: Right;  171mn  ? TRIGGER FINGER RELEASE  2001  ? ? ?Family History  ?Problem Relation Age of Onset  ? Coronary artery disease Mother 873 ? Hypertension Father   ? CVA Father   ? Healthy Sister   ? Healthy Brother   ? Diabetes Neg Hx   ? Cancer Neg Hx   ? ? ?Social History  ? ?Socioeconomic History  ? Marital status: Married  ?  Spouse name: Not on file  ? Number of children: Not on file  ? Years of education: Not on file  ? Highest education level: Not on file  ?Occupational History  ? Occupation: Retired  ?Tobacco Use  ? Smoking status: Never  ? Smokeless tobacco: Never  ?Vaping Use  ? Vaping Use: Never used  ?Substance and Sexual Activity  ? Alcohol use: Yes  ?  Comment: Occasional  ? Drug use: No  ? Sexual activity: Yes  ?Other Topics Concern  ? Not on file  ?Social History Narrative  ? Caffeine: 2-3 cups coffee/day  ? [place] ? 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 Edu: 4 yrs college  ? Activity: [facility].   ? Diet: fruits/vegetables daily, 2x/wk red meat, fish 1x/wk  ? ?Social Determinants of Health  ? ?Financial Resource Strain: Low Risk   ? Difficulty of Paying Living Expenses: Not hard at all  ?Food Insecurity: No Food Insecurity  ? Worried About RCharity fundraiserin the Last Year: Never true  ? Ran Out of Food in the Last Year: Never true  ?Transportation Needs: No Transportation Needs  ? Lack of Transportation (Medical): No  ? Lack of Transportation (Non-Medical): No  ?Physical Activity: Sufficiently Active  ? Days of Exercise per Week: 3 days  ? Minutes of Exercise per Session: 150+ min  ?Stress: No Stress Concern Present  ? Feeling of Stress : Not at all  ?Social Connections: Moderately Integrated  ? Frequency of Communication with Friends and Family: More than three times a week  ? Frequency of Social Gatherings with Friends and Family: More than three times a week  ? Attends Religious Services: Never  ? Active Member of Clubs or Organizations: Yes  ? Attends CArchivistMeetings: More than 4 times per year  ? Marital Status: Married  ?Intimate Partner Violence: Not At Risk  ? Fear of Current or Ex-Partner: No  ? Emotionally Abused: No  ? Physically Abused: No  ? Sexually Abused: No  ? ? ?Outpatient Medications Prior to Visit  ?Medication Sig Dispense Refill  ? Multiple Vitamin (MULTIVITAMIN ADULT PO) Take by mouth.    ? tamsulosin (FLOMAX) 0.4 MG CAPS capsule Take 1 capsule (0.4 mg total) by mouth daily. 30 capsule 3  ? methocarbamol (ROBAXIN) 500 MG tablet Take 500 mg by mouth 4 (four) times daily.    ? ?No facility-administered medications prior to visit.  ? ? ?Allergies  ?Allergen Reactions  ? Lidocaine Other (See Comments)  ?  Dropped blood pressures severely  ? Sulfa Drugs Cross Reactors Hives  ? ? ?ROS ?Review of Systems  ?Constitutional:  Negative for  activity change, appetite change, chills, fatigue and fever.  ?Gastrointestinal:  Negative for abdominal pain.  ?Genitourinary:  Negative for decreased urine volume, difficulty urinating, dysuria, flank pain, frequency, genital sores, hematuria and urgency.  ?Musculoskeletal:  Positive for arthralgias (right upper thigh  pain, worse with movement. tender to touch).  ?Skin:  Positive for wound (laceration right anterior hand yesterday fell on gravel and opened up hand a bit, applying neosporin and cleaning site which has helped).  ?Neurological:  Negative for dizziness and light-headedness.  ?Psychiatric/Behavioral:  Negative for confusion.   ?All other systems reviewed and are negative. ? ?  ?Objective:  ?  ?Physical Exam ?Constitutional:   ?   General: He is not in acute distress. ?   Appearance: Normal appearance. He is normal weight. He is not ill-appearing, toxic-appearing or diaphoretic.  ?Pulmonary:  ?   Effort: Pulmonary effort is normal.  ?Musculoskeletal:  ?   Lumbar back: No tenderness. Negative right straight leg raise test and negative left straight leg raise test.  ?   Right lower leg: Tenderness (right thigh tenderness along muscle, with muscle spasm/tigthness) present.  ?Skin: ?   Findings: Laceration (right anterior hand with laceration, with healing pink skin, slight dried clear drainage., mild surrounding erythema) present.  ?Neurological:  ?   Mental Status: He is alert.  ? ? ?BP 118/60   Pulse 71   Temp 98.4 ?F (36.9 ?C) (Temporal)   Ht '5\' 9"'$  (1.753 m)   Wt 194 lb (88 kg)   SpO2 97%   BMI 28.65 kg/m?  ?Wt Readings from Last 3 Encounters:  ?07/17/21 194 lb (88 kg)  ?06/24/21 194 lb (88 kg)  ?06/24/21 200 lb 4 oz (90.8 kg)  ? ? ? ?Health Maintenance Due  ?Topic Date Due  ? COVID-19 Vaccine (4 - Booster for Pfizer series) 04/16/2020  ? ? ?There are no preventive care reminders to display for this patient. ? ?Lab Results  ?Component Value Date  ? TSH 0.915 01/19/2018  ? ?Lab Results  ?Component  Value Date  ? WBC 11.8 (H) 03/15/2021  ? HGB 13.5 03/15/2021  ? HCT 39.1 03/15/2021  ? MCV 93.5 03/15/2021  ? PLT 209 03/15/2021  ? ?Lab Results  ?Component Value Date  ? NA 141 03/15/2021  ? K 4.0 03/15/2021  ? CO2

## 2021-07-18 DIAGNOSIS — M79651 Pain in right thigh: Secondary | ICD-10-CM | POA: Insufficient documentation

## 2021-07-18 DIAGNOSIS — S60419A Abrasion of unspecified finger, initial encounter: Secondary | ICD-10-CM | POA: Insufficient documentation

## 2021-07-18 NOTE — Assessment & Plan Note (Signed)
venous doppler u/s right leg, thigh ?R/o dvt ? ?If u/s negative: ?Heat/ice to site ?Exercises printed and given to pt, suspected hamstring sprain  ?Tylenol prn  ?voltaren gel ok to try.  ?Robaxin 500 mg prn muscle spasm ?

## 2021-07-18 NOTE — Assessment & Plan Note (Signed)
Mupirocin 2% ointment sent to pharmacy ?Pt to watch for s/s infection ?

## 2021-07-23 NOTE — Progress Notes (Signed)
[facility] did not view their my chart test result that I responded to one week ago.  Please call them and advise them of the below results.

## 2021-08-19 ENCOUNTER — Ambulatory Visit: Payer: Medicare Other | Admitting: Dermatology

## 2021-09-02 ENCOUNTER — Ambulatory Visit (INDEPENDENT_AMBULATORY_CARE_PROVIDER_SITE_OTHER): Payer: Medicare Other | Admitting: Dermatology

## 2021-09-02 DIAGNOSIS — Z1283 Encounter for screening for malignant neoplasm of skin: Secondary | ICD-10-CM | POA: Diagnosis not present

## 2021-09-02 DIAGNOSIS — D229 Melanocytic nevi, unspecified: Secondary | ICD-10-CM | POA: Diagnosis not present

## 2021-09-02 DIAGNOSIS — L821 Other seborrheic keratosis: Secondary | ICD-10-CM | POA: Diagnosis not present

## 2021-09-02 DIAGNOSIS — L814 Other melanin hyperpigmentation: Secondary | ICD-10-CM

## 2021-09-02 DIAGNOSIS — Z86006 Personal history of melanoma in-situ: Secondary | ICD-10-CM | POA: Diagnosis not present

## 2021-09-02 DIAGNOSIS — L570 Actinic keratosis: Secondary | ICD-10-CM | POA: Diagnosis not present

## 2021-09-02 DIAGNOSIS — D1800 Hemangioma unspecified site: Secondary | ICD-10-CM

## 2021-09-02 DIAGNOSIS — D485 Neoplasm of uncertain behavior of skin: Secondary | ICD-10-CM | POA: Diagnosis not present

## 2021-09-02 DIAGNOSIS — L578 Other skin changes due to chronic exposure to nonionizing radiation: Secondary | ICD-10-CM | POA: Diagnosis not present

## 2021-09-02 DIAGNOSIS — Z85828 Personal history of other malignant neoplasm of skin: Secondary | ICD-10-CM

## 2021-09-02 DIAGNOSIS — D489 Neoplasm of uncertain behavior, unspecified: Secondary | ICD-10-CM

## 2021-09-02 NOTE — Patient Instructions (Addendum)
Biopsy Wound Care Instructions  Leave the original bandage on for 24 hours if possible.  If the bandage becomes soaked or soiled before that time, it is OK to remove it and examine the wound.  A small amount of post-operative bleeding is normal.  If excessive bleeding occurs, remove the bandage, place gauze over the site and apply continuous pressure (no peeking) over the area for 30 minutes. If this does not work, please call our clinic as soon as possible or page your doctor if it is after hours.   Once a day, cleanse the wound with soap and water. It is fine to shower. If a thick crust develops you may use a Q-tip dipped into dilute hydrogen peroxide (mix 1:1 with water) to dissolve it.  Hydrogen peroxide can slow the healing process, so use it only as needed.    After washing, apply petroleum jelly (Vaseline) or an antibiotic ointment if your doctor prescribed one for you, followed by a bandage.    For best healing, the wound should be covered with a layer of ointment at all times. If you are not able to keep the area covered with a bandage to hold the ointment in place, this may mean re-applying the ointment several times a day.  Continue this wound care until the wound has healed and is no longer open.   Itching and mild discomfort is normal during the healing process. However, if you develop pain or severe itching, please call our office.   If you have any discomfort, you can take Tylenol (acetaminophen) or ibuprofen as directed on the bottle. (Please do not take these if you have an allergy to them or cannot take them for another reason).  Some redness, tenderness and white or yellow material in the wound is normal healing.  If the area becomes very sore and red, or develops a thick yellow-green material (pus), it may be infected; please notify us.    If you have stitches, return to clinic as directed to have the stitches removed. You will continue wound care for 2-3 days after the stitches  are removed.   Wound healing continues for up to one year following surgery. It is not unusual to experience pain in the scar from time to time during the interval.  If the pain becomes severe or the scar thickens, you should notify the office.    A slight amount of redness in a scar is expected for the first six months.  After six months, the redness will fade and the scar will soften and fade.  The color difference becomes less noticeable with time.  If there are any problems, return for a post-op surgery check at your earliest convenience.  To improve the appearance of the scar, you can use silicone scar gel, cream, or sheets (such as Mederma or Serica) every night for up to one year. These are available over the counter (without a prescription).  Please call our office at [phone] for any questions or concerns.      Cryotherapy Aftercare  Wash gently with soap and water everyday.   Apply Vaseline and Band-Aid daily until healed.   Actinic keratoses are precancerous spots that appear secondary to cumulative UV radiation exposure/sun exposure over time. They are chronic with expected duration over 1 year. A portion of actinic keratoses will progress to squamous cell carcinoma of the skin. It is not possible to reliably predict which spots will progress to skin cancer and so treatment is recommended to   prevent development of skin cancer.  Recommend daily broad spectrum sunscreen SPF 30+ to sun-exposed areas, reapply every 2 hours as needed.  Recommend staying in the shade or wearing long sleeves, sun glasses (UVA+UVB protection) and wide brim hats (4-inch brim around the entire circumference of the hat). Call for new or changing lesions.       Melanoma ABCDEs  Melanoma is the most dangerous type of skin cancer, and is the leading cause of death from skin disease.  You are more likely to develop melanoma if you: Have light-colored skin, light-colored eyes, or red or blond  hair Spend a lot of time in the sun Tan regularly, either outdoors or in a tanning bed Have had blistering sunburns, especially during childhood Have a close family member who has had a melanoma Have atypical moles or large birthmarks  Early detection of melanoma is key since treatment is typically straightforward and cure rates are extremely high if we catch it early.   The first sign of melanoma is often a change in a mole or a new dark spot.  The ABCDE system is a way of remembering the signs of melanoma.  A for asymmetry:  The two halves do not match. B for border:  The edges of the growth are irregular. C for color:  A mixture of colors are present instead of an even brown color. D for diameter:  Melanomas are usually (but not always) greater than 6mm - the size of a pencil eraser. E for evolution:  The spot keeps changing in size, shape, and color.  Please check your skin once per month between visits. You can use a small mirror in front and a large mirror behind you to keep an eye on the back side or your body.   If you see any new or changing lesions before your next follow-up, please call to schedule a visit.  Please continue daily skin protection including broad spectrum sunscreen SPF 30+ to sun-exposed areas, reapplying every 2 hours as needed when you're outdoors.   Staying in the shade or wearing long sleeves, sun glasses (UVA+UVB protection) and wide brim hats (4-inch brim around the entire circumference of the hat) are also recommended for sun protection.    Due to recent changes in healthcare laws, you may see results of your pathology and/or laboratory studies on MyChart before the doctors have had a chance to review them. We understand that in some cases there may be results that are confusing or concerning to you. Please understand that not all results are received at the same time and often the doctors may need to interpret multiple results in order to provide you with  the best plan of care or course of treatment. Therefore, we ask that you please give us 2 business days to thoroughly review all your results before contacting the office for clarification. Should we see a critical lab result, you will be contacted sooner.   If You Need Anything After Your Visit  If you have any questions or concerns for your doctor, please call our main line at [phone] and press option 4 to reach your doctor's medical assistant. If no one answers, please leave a voicemail as directed and we will return your call as soon as possible. Messages left after 4 pm will be answered the following business day.   You may also send us a message via MyChart. We typically respond to MyChart messages within 1-2 business days.  For prescription refills, please ask   your pharmacy to contact our office. Our fax number is [phone].  If you have an urgent issue when the clinic is closed that cannot wait until the next business day, you can page your doctor at the number below.    Please note that while we do our best to be available for urgent issues outside of office hours, we are not available 24/7.   If you have an urgent issue and are unable to reach us, you may choose to seek medical care at your doctor's office, retail clinic, urgent care center, or emergency room.  If you have a medical emergency, please immediately call 911 or go to the emergency department.  Pager Numbers  - Dr. Kowalski: [phone]  - Dr. Moye: [phone]  - Dr. Stewart: [phone]  In the event of inclement weather, please call our main line at [phone] for an update on the status of any delays or closures.  Dermatology Medication Tips: Please keep the boxes that topical medications come in in order to help keep track of the instructions about where and how to use these. Pharmacies typically print the medication instructions only on the boxes and not directly on the medication tubes.   If  your medication is too expensive, please contact our office at [phone] option 4 or send us a message through MyChart.   We are unable to tell what your co-pay for medications will be in advance as this is different depending on your insurance coverage. However, we may be able to find a substitute medication at lower cost or fill out paperwork to get insurance to cover a needed medication.   If a prior authorization is required to get your medication covered by your insurance company, please allow us 1-2 business days to complete this process.  Drug prices often vary depending on where the prescription is filled and some pharmacies may offer cheaper prices.  The website www.goodrx.com contains coupons for medications through different pharmacies. The prices here do not account for what the cost may be with help from insurance (it may be cheaper with your insurance), but the website can give you the price if you did not use any insurance.  - You can print the associated coupon and take it with your prescription to the pharmacy.  - You may also stop by our office during regular business hours and pick up a GoodRx coupon card.  - If you need your prescription sent electronically to a different pharmacy, notify our office through [facility] MyChart or by phone at [phone] option 4.     Si Usted Necesita Algo Despus de Su Visita  Tambin puede enviarnos un mensaje a travs de MyChart. Por lo general respondemos a los mensajes de MyChart en el transcurso de 1 a 2 das hbiles.  Para renovar recetas, por favor pida a su farmacia que se ponga en contacto con nuestra oficina. Nuestro nmero de fax es el [phone].  Si tiene un asunto urgente cuando la clnica est cerrada y que no puede esperar hasta el siguiente da hbil, puede llamar/localizar a su doctor(a) al nmero que aparece a continuacin.   Por favor, tenga en cuenta que aunque hacemos todo lo posible para estar disponibles para  asuntos urgentes fuera del horario de oficina, no estamos disponibles las 24 horas del da, los 7 das de la semana.   Si tiene un problema urgente y no puede comunicarse con nosotros, puede optar por buscar atencin mdica  en el consultorio de su doctor(a),   en una clnica privada, en un centro de atencin urgente o en una sala de emergencias.  Si tiene una emergencia mdica, por favor llame inmediatamente al 911 o vaya a la sala de emergencias.  Nmeros de bper  - Dr. Kowalski: [phone]  - Dra. Moye: [phone]  - Dra. Stewart: [phone]  En caso de inclemencias del tiempo, por favor llame a nuestra lnea principal al [phone] para una actualizacin sobre el estado de cualquier retraso o cierre.  Consejos para la medicacin en dermatologa: Por favor, guarde las cajas en las que vienen los medicamentos de uso tpico para ayudarle a seguir las instrucciones sobre dnde y cmo usarlos. Las farmacias generalmente imprimen las instrucciones del medicamento slo en las cajas y no directamente en los tubos del medicamento.   Si su medicamento es muy caro, por favor, pngase en contacto con nuestra oficina llamando al [phone] y presione la opcin 4 o envenos un mensaje a travs de MyChart.   No podemos decirle cul ser su copago por los medicamentos por adelantado ya que esto es diferente dependiendo de la cobertura de su seguro. Sin embargo, es posible que podamos encontrar un medicamento sustituto a menor costo o llenar un formulario para que el seguro cubra el medicamento que se considera necesario.   Si se requiere una autorizacin previa para que su compaa de seguros cubra su medicamento, por favor permtanos de 1 a 2 das hbiles para completar este proceso.  Los precios de los medicamentos varan con frecuencia dependiendo del lugar de dnde se surte la receta y alguna farmacias pueden ofrecer precios ms baratos.  El sitio web www.goodrx.com tiene cupones para  medicamentos de diferentes farmacias. Los precios aqu no tienen en cuenta lo que podra costar con la ayuda del seguro (puede ser ms barato con su seguro), pero el sitio web puede darle el precio si no utiliz ningn seguro.  - Puede imprimir el cupn correspondiente y llevarlo con su receta a la farmacia.  - Tambin puede pasar por nuestra oficina durante el horario de atencin regular y recoger una tarjeta de cupones de GoodRx.  - Si necesita que su receta se enve electrnicamente a una farmacia diferente, informe a nuestra oficina a travs de MyChart de [facility] o por telfono llamando al [phone] y presione la opcin 4.  

## 2021-09-02 NOTE — Progress Notes (Signed)
Follow-Up Visit   Subjective  Samuel Matthews is a 81 y.o. male who presents for the following: Annual Exam (Hx of melanoma in situ, hx of bcc, hx of scc, hx of isks. ).  The patient presents for Total-Body Skin Exam (TBSE) for skin cancer screening and mole check.  The patient has spots, moles and lesions to be evaluated, some may be new or changing and the patient has concerns that these could be cancer.  The following portions of the chart were reviewed this encounter and updated as appropriate:      Review of Systems: No other skin or systemic complaints except as noted in HPI or Assessment and Plan.   Objective  Well appearing patient in no apparent distress; mood and affect are within normal limits.  All skin waist up examined.  left shoulder 0.5 cm speckled brown papule slightly waxy   Left Temple x 2, right vertex x 1, right preauricular x 2 (5) Erythematous thin papules/macules with gritty scale.    Assessment & Plan  Neoplasm of uncertain behavior left shoulder  Epidermal / dermal shaving  Lesion diameter (cm):  0.6 Informed consent: discussed and consent obtained   Patient was prepped and draped in usual sterile fashion: Area prepped with alcohol. Anesthesia: the lesion was anesthetized in a standard fashion   Anesthetic:  1% lidocaine w/ epinephrine 1-100,000 buffered w/ 8.4% NaHCO3 Instrument used: flexible razor blade   Hemostasis achieved with: pressure, aluminum chloride and electrodesiccation   Outcome: patient tolerated procedure well   Post-procedure details: wound care instructions given   Post-procedure details comment:  Ointment and small bandage applied.   Specimen 1 - Surgical pathology Differential Diagnosis: Sk r/o atypia   Check Margins: No  left shoulder 5 cm from top of shoulder, 11 cm from top of anterior axilla  Sk r/o atypia   At left shoulder 5 cm distal from boney top of shoulder, 11 cm superior to top of anterior axilla  crease, Camera not working today, no prebiopsy photo  Actinic keratosis (5) Left Temple x 2, right vertex x 1, right preauricular x 2  Actinic keratoses are precancerous spots that appear secondary to cumulative UV radiation exposure/sun exposure over time. They are chronic with expected duration over 1 year. A portion of actinic keratoses will progress to squamous cell carcinoma of the skin. It is not possible to reliably predict which spots will progress to skin cancer and so treatment is recommended to prevent development of skin cancer.  Recommend daily broad spectrum sunscreen SPF 30+ to sun-exposed areas, reapply every 2 hours as needed.  Recommend staying in the shade or wearing long sleeves, sun glasses (UVA+UVB protection) and wide brim hats (4-inch brim around the entire circumference of the hat). Call for new or changing lesions.  Destruction of lesion - Left Temple x 2, right vertex x 1, right preauricular x 2  Destruction method: cryotherapy   Informed consent: discussed and consent obtained   Lesion destroyed using liquid nitrogen: Yes   Region frozen until ice ball extended beyond lesion: Yes   Outcome: patient tolerated procedure well with no complications   Post-procedure details: wound care instructions given   Additional details:  Prior to procedure, discussed risks of blister formation, small wound, skin dyspigmentation, or rare scar following cryotherapy. Recommend Vaseline ointment to treated areas while healing.   Lentigines - Scattered tan macules - Due to sun exposure - Benign-appearing, observe - Recommend daily broad spectrum sunscreen SPF 30+ to sun-exposed  areas, reapply every 2 hours as needed. - Call for any changes  Seborrheic Keratoses - Stuck-on, waxy, tan-brown papules and/or plaques  - Benign-appearing - Discussed benign etiology and prognosis. - Observe - Call for any changes  Melanocytic Nevi - Tan-brown and/or pink-flesh-colored symmetric  macules and papules, including left lower back - Benign appearing on exam today - Observation - Call clinic for new or changing moles - Recommend daily use of broad spectrum spf 30+ sunscreen to sun-exposed areas.   Hemangiomas - Red papules - Discussed benign nature - Observe - Call for any changes  Actinic Damage - Chronic condition, secondary to cumulative UV/sun exposure - diffuse scaly erythematous macules with underlying dyspigmentation - Recommend daily broad spectrum sunscreen SPF 30+ to sun-exposed areas, reapply every 2 hours as needed.  - Staying in the shade or wearing long sleeves, sun glasses (UVA+UVB protection) and wide brim hats (4-inch brim around the entire circumference of the hat) are also recommended for sun protection.  - Call for new or changing lesions.  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  - Call if any new or changing lesions are noted between office visits  Skin cancer screening performed today. Return in about 6  months (around 03/04/2022) for TBSE. I, Ruthell Rummage, CMA, am acting as scribe for Brendolyn Patty, MD.  Documentation: I have reviewed the above documentation for accuracy and completeness, and I agree with the above.  Brendolyn Patty MD

## 2021-09-04 ENCOUNTER — Ambulatory Visit (INDEPENDENT_AMBULATORY_CARE_PROVIDER_SITE_OTHER): Payer: Medicare Other | Admitting: Urology

## 2021-09-04 ENCOUNTER — Encounter: Payer: Self-pay | Admitting: Urology

## 2021-09-04 ENCOUNTER — Telehealth: Payer: Self-pay

## 2021-09-04 VITALS — BP 125/75 | HR 71 | Ht 71.0 in | Wt 190.0 lb

## 2021-09-04 DIAGNOSIS — Z87898 Personal history of other specified conditions: Secondary | ICD-10-CM

## 2021-09-04 DIAGNOSIS — R339 Retention of urine, unspecified: Secondary | ICD-10-CM | POA: Diagnosis not present

## 2021-09-04 DIAGNOSIS — N401 Enlarged prostate with lower urinary tract symptoms: Secondary | ICD-10-CM | POA: Diagnosis not present

## 2021-09-04 DIAGNOSIS — N138 Other obstructive and reflux uropathy: Secondary | ICD-10-CM | POA: Diagnosis not present

## 2021-09-04 LAB — BLADDER SCAN AMB NON-IMAGING

## 2021-09-04 MED ORDER — TAMSULOSIN HCL 0.4 MG PO CAPS: 0.4000 mg | ORAL_CAPSULE | Freq: Every day | ORAL | 11 refills | Status: DC

## 2021-09-04 NOTE — Telephone Encounter (Signed)
Advised pt of bx result/sh ?

## 2021-09-04 NOTE — Patient Instructions (Signed)

## 2021-09-04 NOTE — Telephone Encounter (Signed)
-----   Message from Brendolyn Patty, MD sent at 09/03/2021  6:59 PM EDT ----- Skin , left shoulder PIGMENTED SEBORRHEIC KERATOSIS  Benign SK - please call patient

## 2021-10-22 DIAGNOSIS — Z961 Presence of intraocular lens: Secondary | ICD-10-CM | POA: Diagnosis not present

## 2022-03-03 ENCOUNTER — Ambulatory Visit (INDEPENDENT_AMBULATORY_CARE_PROVIDER_SITE_OTHER): Payer: Medicare Other | Admitting: Dermatology

## 2022-03-03 DIAGNOSIS — L570 Actinic keratosis: Secondary | ICD-10-CM | POA: Diagnosis not present

## 2022-03-03 DIAGNOSIS — L578 Other skin changes due to chronic exposure to nonionizing radiation: Secondary | ICD-10-CM

## 2022-03-03 DIAGNOSIS — Z1283 Encounter for screening for malignant neoplasm of skin: Secondary | ICD-10-CM | POA: Diagnosis not present

## 2022-03-03 DIAGNOSIS — Z86006 Personal history of melanoma in-situ: Secondary | ICD-10-CM | POA: Diagnosis not present

## 2022-03-03 DIAGNOSIS — L820 Inflamed seborrheic keratosis: Secondary | ICD-10-CM | POA: Diagnosis not present

## 2022-03-03 DIAGNOSIS — D229 Melanocytic nevi, unspecified: Secondary | ICD-10-CM

## 2022-03-03 DIAGNOSIS — L814 Other melanin hyperpigmentation: Secondary | ICD-10-CM

## 2022-03-03 DIAGNOSIS — Z85828 Personal history of other malignant neoplasm of skin: Secondary | ICD-10-CM

## 2022-03-03 DIAGNOSIS — L821 Other seborrheic keratosis: Secondary | ICD-10-CM

## 2022-03-03 NOTE — Progress Notes (Signed)
Follow-Up Visit   Subjective  Samuel Matthews is a 81 y.o. male who presents for the following: Follow-up.  The patient presents for 6 month Total-Body Skin Exam (TBSE) for skin cancer screening and mole check.  The patient has spots, moles and lesions to be evaluated, some may be new or changing. He has a history of melanoma in situ x 3, BCC and SCC.   The following portions of the chart were reviewed this encounter and updated as appropriate:       Review of Systems:  No other skin or systemic complaints except as noted in HPI or Assessment and Plan.  Objective  Well appearing patient in no apparent distress; mood and affect are within normal limits.  A focused examination was performed including all skin waist up, lower legs. Relevant physical exam findings are noted in the Assessment and Plan.  spinal upper back, R post shoulder x 2, L spinal upper back Well healed scar with no evidence of recurrence.   Right Postauricular x 1; Left Frontal Scalp x 1 (2) Erythematous stuck-on, waxy papule  Left Forehead x 1; Left Vertex x 1 (2) Pink scaly macules.    Assessment & Plan  Skin cancer screening performed today.  Actinic Damage - chronic, secondary to cumulative UV radiation exposure/sun exposure over time - diffuse scaly erythematous macules with underlying dyspigmentation - Recommend daily broad spectrum sunscreen SPF 30+ to sun-exposed areas, reapply every 2 hours as needed.  - Recommend staying in the shade or wearing long sleeves, sun glasses (UVA+UVB protection) and wide brim hats (4-inch brim around the entire circumference of the hat). - Call for new or changing lesions.  Lentigines - Scattered tan macules - Due to sun exposure - Benign-appearing, observe - Recommend daily broad spectrum sunscreen SPF 30+ to sun-exposed areas, reapply every 2 hours as needed. - Call for any changes  Seborrheic Keratoses - Stuck-on, waxy, tan-brown papules and/or plaques  -  Benign-appearing - Discussed benign etiology and prognosis. - Observe - Call for any changes  Melanocytic Nevi - Tan-brown and/or pink-flesh-colored symmetric macules and papules, including left lower back pink brown papule - Benign appearing on exam today - Observation - Call clinic for new or changing moles - Recommend daily use of broad spectrum spf 30+ sunscreen to sun-exposed areas.   History of Basal Cell Carcinoma of the Skin - No evidence of recurrence today, multiple (see history) - Recommend regular full body skin exams - Recommend daily broad spectrum sunscreen SPF 30+ to sun-exposed areas, reapply every 2 hours as needed.  - Call if any new or changing lesions are noted between office visits  History of Squamous Cell Carcinoma of the Skin - No evidence of recurrence today - Recommend regular full body skin exams - Recommend daily broad spectrum sunscreen SPF 30+ to sun-exposed areas, reapply every 2 hours as needed.  - Call if any new or changing lesions are noted between office visits  Hemangiomas - Red papules - Discussed benign nature - Observe - Call for any changes  History of melanoma in situ spinal upper back, R post shoulder x 2, L spinal upper back  Clear. Observe for recurrence. Call clinic for new or changing lesions.  Recommend regular skin exams, daily broad-spectrum spf 30+ sunscreen use, and photoprotection.    Inflamed seborrheic keratosis (2) Right Postauricular x 1; Left Frontal Scalp x 1  Symptomatic, irritating, patient would like treated.  Destruction of lesion - Right Postauricular x 1; Left Frontal  Scalp x 1  Destruction method: cryotherapy   Informed consent: discussed and consent obtained   Lesion destroyed using liquid nitrogen: Yes   Region frozen until ice ball extended beyond lesion: Yes   Outcome: patient tolerated procedure well with no complications   Post-procedure details: wound care instructions given   Additional details:   Prior to procedure, discussed risks of blister formation, small wound, skin dyspigmentation, or rare scar following cryotherapy. Recommend Vaseline ointment to treated areas while healing.   AK (actinic keratosis) (2) Left Forehead x 1; Left Vertex x 1  Actinic keratoses are precancerous spots that appear secondary to cumulative UV radiation exposure/sun exposure over time. They are chronic with expected duration over 1 year. A portion of actinic keratoses will progress to squamous cell carcinoma of the skin. It is not possible to reliably predict which spots will progress to skin cancer and so treatment is recommended to prevent development of skin cancer.  Recommend daily broad spectrum sunscreen SPF 30+ to sun-exposed areas, reapply every 2 hours as needed.  Recommend staying in the shade or wearing long sleeves, sun glasses (UVA+UVB protection) and wide brim hats (4-inch brim around the entire circumference of the hat). Call for new or changing lesions.  Destruction of lesion - Left Forehead x 1; Left Vertex x 1  Destruction method: cryotherapy   Informed consent: discussed and consent obtained   Lesion destroyed using liquid nitrogen: Yes   Region frozen until ice ball extended beyond lesion: Yes   Outcome: patient tolerated procedure well with no complications   Post-procedure details: wound care instructions given   Additional details:  Prior to procedure, discussed risks of blister formation, small wound, skin dyspigmentation, or rare scar following cryotherapy. Recommend Vaseline ointment to treated areas while healing.    Return in about 6 months (around 09/02/2022) for TBSE, Hx melanoma, Hx BCC, Hx SCC.  IJamesetta Orleans, CMA, am acting as scribe for Brendolyn Patty, MD .  Documentation: I have reviewed the above documentation for accuracy and completeness, and I agree with the above.  Brendolyn Patty MD

## 2022-03-03 NOTE — Patient Instructions (Addendum)
Cryotherapy Aftercare  Wash gently with soap and water everyday.   Apply Vaseline and Band-Aid daily until healed.   Melanoma ABCDEs  Melanoma is the most dangerous type of skin cancer, and is the leading cause of death from skin disease.  You are more likely to develop melanoma if you: Have light-colored skin, light-colored eyes, or red or blond hair Spend a lot of time in the sun Tan regularly, either outdoors or in a tanning bed Have had blistering sunburns, especially during childhood Have a close family member who has had a melanoma Have atypical moles or large birthmarks  Early detection of melanoma is key since treatment is typically straightforward and cure rates are extremely high if we catch it early.   The first sign of melanoma is often a change in a mole or a new dark spot.  The ABCDE system is a way of remembering the signs of melanoma.  A for asymmetry:  The two halves do not match. B for border:  The edges of the growth are irregular. C for color:  A mixture of colors are present instead of an even brown color. D for diameter:  Melanomas are usually (but not always) greater than 6mm - the size of a pencil eraser. E for evolution:  The spot keeps changing in size, shape, and color.  Please check your skin once per month between visits. You can use a small mirror in front and a large mirror behind you to keep an eye on the back side or your body.   If you see any new or changing lesions before your next follow-up, please call to schedule a visit.  Please continue daily skin protection including broad spectrum sunscreen SPF 30+ to sun-exposed areas, reapplying every 2 hours as needed when you're outdoors.   Staying in the shade or wearing long sleeves, sun glasses (UVA+UVB protection) and wide brim hats (4-inch brim around the entire circumference of the hat) are also recommended for sun protection.      Due to recent changes in healthcare laws, you may see results of  your pathology and/or laboratory studies on MyChart before the doctors have had a chance to review them. We understand that in some cases there may be results that are confusing or concerning to you. Please understand that not all results are received at the same time and often the doctors may need to interpret multiple results in order to provide you with the best plan of care or course of treatment. Therefore, we ask that you please give us 2 business days to thoroughly review all your results before contacting the office for clarification. Should we see a critical lab result, you will be contacted sooner.   If You Need Anything After Your Visit  If you have any questions or concerns for your doctor, please call our main line at [phone] and press option 4 to reach your doctor's medical assistant. If no one answers, please leave a voicemail as directed and we will return your call as soon as possible. Messages left after 4 pm will be answered the following business day.   You may also send us a message via MyChart. We typically respond to MyChart messages within 1-2 business days.  For prescription refills, please ask your pharmacy to contact our office. Our fax number is [phone].  If you have an urgent issue when the clinic is closed that cannot wait until the next business day, you can page your doctor at the number   below.    Please note that while we do our best to be available for urgent issues outside of office hours, we are not available 24/7.   If you have an urgent issue and are unable to reach us, you may choose to seek medical care at your doctor's office, retail clinic, urgent care center, or emergency room.  If you have a medical emergency, please immediately call 911 or go to the emergency department.  Pager Numbers  - Dr. Kowalski: [phone]  - Dr. Moye: [phone]  - Dr. Stewart: [phone]  In the event of inclement weather, please call our main line at  [phone] for an update on the status of any delays or closures.  Dermatology Medication Tips: Please keep the boxes that topical medications come in in order to help keep track of the instructions about where and how to use these. Pharmacies typically print the medication instructions only on the boxes and not directly on the medication tubes.   If your medication is too expensive, please contact our office at [phone] option 4 or send us a message through MyChart.   We are unable to tell what your co-pay for medications will be in advance as this is different depending on your insurance coverage. However, we may be able to find a substitute medication at lower cost or fill out paperwork to get insurance to cover a needed medication.   If a prior authorization is required to get your medication covered by your insurance company, please allow us 1-2 business days to complete this process.  Drug prices often vary depending on where the prescription is filled and some pharmacies may offer cheaper prices.  The website www.goodrx.com contains coupons for medications through different pharmacies. The prices here do not account for what the cost may be with help from insurance (it may be cheaper with your insurance), but the website can give you the price if you did not use any insurance.  - You can print the associated coupon and take it with your prescription to the pharmacy.  - You may also stop by our office during regular business hours and pick up a GoodRx coupon card.  - If you need your prescription sent electronically to a different pharmacy, notify our office through [facility] MyChart or by phone at [phone] option 4.     Si Usted Necesita Algo Despus de Su Visita  Tambin puede enviarnos un mensaje a travs de MyChart. Por lo general respondemos a los mensajes de MyChart en el transcurso de 1 a 2 das hbiles.  Para renovar recetas, por favor pida a su farmacia que se  ponga en contacto con nuestra oficina. Nuestro nmero de fax es el [phone].  Si tiene un asunto urgente cuando la clnica est cerrada y que no puede esperar hasta el siguiente da hbil, puede llamar/localizar a su doctor(a) al nmero que aparece a continuacin.   Por favor, tenga en cuenta que aunque hacemos todo lo posible para estar disponibles para asuntos urgentes fuera del horario de oficina, no estamos disponibles las 24 horas del da, los 7 das de la semana.   Si tiene un problema urgente y no puede comunicarse con nosotros, puede optar por buscar atencin mdica  en el consultorio de su doctor(a), en una clnica privada, en un centro de atencin urgente o en una sala de emergencias.  Si tiene una emergencia mdica, por favor llame inmediatamente al 911 o vaya a la sala de emergencias.  Nmeros de bper  -   Dr. Kowalski: [phone]  - Dra. Moye: [phone]  - Dra. Stewart: [phone]  En caso de inclemencias del tiempo, por favor llame a nuestra lnea principal al [phone] para una actualizacin sobre el estado de cualquier retraso o cierre.  Consejos para la medicacin en dermatologa: Por favor, guarde las cajas en las que vienen los medicamentos de uso tpico para ayudarle a seguir las instrucciones sobre dnde y cmo usarlos. Las farmacias generalmente imprimen las instrucciones del medicamento slo en las cajas y no directamente en los tubos del medicamento.   Si su medicamento es muy caro, por favor, pngase en contacto con nuestra oficina llamando al [phone] y presione la opcin 4 o envenos un mensaje a travs de MyChart.   No podemos decirle cul ser su copago por los medicamentos por adelantado ya que esto es diferente dependiendo de la cobertura de su seguro. Sin embargo, es posible que podamos encontrar un medicamento sustituto a menor costo o llenar un formulario para que el seguro cubra el medicamento que se considera necesario.   Si se requiere  una autorizacin previa para que su compaa de seguros cubra su medicamento, por favor permtanos de 1 a 2 das hbiles para completar este proceso.  Los precios de los medicamentos varan con frecuencia dependiendo del lugar de dnde se surte la receta y alguna farmacias pueden ofrecer precios ms baratos.  El sitio web www.goodrx.com tiene cupones para medicamentos de diferentes farmacias. Los precios aqu no tienen en cuenta lo que podra costar con la ayuda del seguro (puede ser ms barato con su seguro), pero el sitio web puede darle el precio si no utiliz ningn seguro.  - Puede imprimir el cupn correspondiente y llevarlo con su receta a la farmacia.  - Tambin puede pasar por nuestra oficina durante el horario de atencin regular y recoger una tarjeta de cupones de GoodRx.  - Si necesita que su receta se enve electrnicamente a una farmacia diferente, informe a nuestra oficina a travs de MyChart de [facility] o por telfono llamando al [phone] y presione la opcin 4.  

## 2022-03-18 ENCOUNTER — Ambulatory Visit: Payer: Medicare Other

## 2022-03-31 ENCOUNTER — Ambulatory Visit (INDEPENDENT_AMBULATORY_CARE_PROVIDER_SITE_OTHER): Payer: Medicare Other

## 2022-03-31 VITALS — BP 125/73 | Wt 192.0 lb

## 2022-03-31 DIAGNOSIS — Z0000 Encounter for general adult medical examination without abnormal findings: Secondary | ICD-10-CM

## 2022-03-31 NOTE — Progress Notes (Signed)
I connected with  Samuel Matthews on 03/31/22 by a audio enabled telemedicine application and verified that I am speaking with the correct person using two identifiers.  Patient Location: Home  Provider Location: Office/Clinic  I discussed the limitations of evaluation and management by telemedicine. The patient expressed understanding and agreed to proceed.  Subjective:   Samuel Matthews is a 82 y.o. male who presents for Medicare Annual/Subsequent preventive examination.  Review of Systems     Cardiac Risk Factors include: advanced age (>67mn, >>103women);male gender     Objective:    Today's Vitals   03/31/22 1032  BP: 125/73  Weight: 192 lb (87.1 kg)   Body mass index is 26.78 kg/m.     03/31/2022   10:35 AM 03/15/2021   12:10 PM 03/11/2021    1:20 PM 04/22/2020    6:00 PM 04/22/2020    5:00 PM 04/22/2020   12:01 PM 04/10/2020    9:52 AM  Advanced Directives  Does Patient Have a Medical Advance Directive? Yes Yes Yes Yes  Yes No;Yes  Type of AParamedicof AMorrisLiving will HWaverlyLiving will HAtqasukLiving will HHighland ParkLiving will HWaynesburgLiving will HGraysvilleLiving will HClovisLiving will  Does patient want to make changes to medical advance directive?   Yes (MAU/Ambulatory/Procedural Areas - Information given) No - Patient declined     Copy of HBridgeportin Chart? No - copy requested     No - copy requested     Current Medications (verified) Outpatient Encounter Medications as of 03/31/2022  Medication Sig   Multiple Vitamin (MULTIVITAMIN ADULT PO) Take by mouth.   tamsulosin (FLOMAX) 0.4 MG CAPS capsule Take 1 capsule (0.4 mg total) by mouth daily.   No facility-administered encounter medications on file as of 03/31/2022.    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   [name], MD   TOTAL HIP ARTHROPLASTY Right 04/22/2020   Procedure: TOTAL HIP ARTHROPLASTY ANTERIOR APPROACH;  Surgeon: [name], MD;  Location: [facility];  Service: Orthopedics;  Laterality: Right;   18mn   TRIGGER FINGER RELEASE  2001   Family History  Problem Relation Age of Onset   Coronary artery disease Mother 876  Hypertension Father    CVA Father    Healthy Sister    Healthy Brother    Diabetes Neg Hx    Cancer Neg Hx    Social History   Socioeconomic History   Marital status: Married    Spouse name: Not on file   Number of children: Not on file   Years of education: Not on file   Highest education level: Not on file  Occupational History   Occupation: Retired  Tobacco Use   Smoking status: Never    Passive exposure: Never   Smokeless tobacco: Never  Vaping Use   Vaping Use: Never used  Substance and Sexual Activity   Alcohol use: Yes    Comment: Occasional   Drug use: No   Sexual activity: Yes  Other Topics Concern   Not on file  Social History Narrative   Caffeine: 2-3 cups coffee/day   [place]  Occupation: retired, 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 gym twice weekly.    Diet: fruits/vegetables daily, 2x/wk red meat, fish 1x/wk   Social Determinants of Health   Financial Resource Strain: Low Risk  (03/31/2022)   Overall Financial Resource Strain (CARDIA)    Difficulty of Paying Living Expenses: Not hard at all  Food Insecurity: No Food Insecurity (03/31/2022)   Hunger Vital Sign    Worried About Running Out of Food in the Last Year: Never true    Ran Out of Food in the Last Year: Never true  Transportation Needs: No Transportation Needs (03/31/2022)   PRAPARE - THydrologist(Medical): No    Lack of Transportation (Non-Medical): No  Physical Activity: Sufficiently Active (03/31/2022)   Exercise Vital Sign    Days of Exercise per Week: 4 days    Minutes of Exercise per Session: 150+ min  Stress: No Stress Concern Present (03/31/2022)   [place]   Feeling of Stress : Not at all  Social Connections: Moderately Integrated (03/31/2022)   Social Connection and Isolation Panel [NHANES]    Frequency of Communication with Friends and Family: More than three times a week    Frequency of Social Gatherings with Friends and Family: More than three times a week    Attends Religious Services: Never    AMarine scientistor Organizations: Yes    Attends CArchivistMeetings: 1 to 4 times per  year    Marital Status: Married    Tobacco Counseling Counseling given: Not Answered   Clinical Intake:  Pre-visit preparation completed: Yes  Pain : No/denies pain     BMI - recorded: 26.78 Nutritional Status: BMI 25 -29 Overweight Nutritional Risks: None Diabetes: No  How often do you need to have someone help you when you read instructions, pamphlets, or other written materials from your doctor or pharmacy?: 1 - Never  Diabetic?no  Interpreter Needed?: No  Information entered by :: Charlott Rakes, LPN   Activities of Daily Living    03/31/2022   10:36 AM  In your present state of health, do you have any difficulty performing the following activities:  Hearing? 0  Vision? 0  Difficulty concentrating or making decisions? 0  Walking or climbing stairs? 0  Dressing or bathing? 0  Doing errands, shopping? 0  Preparing Food and eating ? N  Using the Toilet? N  In the past six months, have you accidently leaked urine? N  Do you have problems with loss of bowel control? N  Managing your Medications? N  Managing your Finances? N  Housekeeping or managing your Housekeeping? N    Patient Care Team: Ria Bush, MD as PCP - General (Family Medicine)  Indicate any recent Medical Services you may have received from other than [facility] providers in the past year (date may be approximate).     Assessment:   This is a routine wellness examination for Kynd[place].  Hearing/Vision  screen Hearing Screening - Comments:: Pt denies any hearing issues  Vision Screening - Comments:: [facility] Geryl Councilman for annual eye exams   Dietary issues and exercise activities discussed: Current Exercise Habits: Home exercise routine, Type of exercise: walking;Other - see comments (golfing and gym), Time (Minutes): > 60, Frequency (Times/Week): 4, Weekly Exercise (Minutes/Week): 0   Goals Addressed             This Visit's Progress    Patient Stated       Patient Stated       Stay healthy and active        Depression Screen    03/31/2022   10:34 AM 03/11/2021    1:23 PM 03/05/2020    1:20 PM 01/25/2019    3:13 PM 12/16/2017   10:00 AM 12/10/2016   11:29 AM 07/04/2015    9:23 AM  PHQ 2/9 Scores  PHQ - 2 Score 0 0 0 0 0 0 0  PHQ- 9 Score   0  0 0     Fall Risk    03/31/2022   10:36 AM 03/11/2021    1:22 PM 03/05/2020    1:18 PM 02/08/2019   11:20 AM 01/25/2019    3:13 PM  Fall Risk   Falls in the past year? 0 0 0 0 0  Comment    Emmi Telephone Survey: data to providers prior to load   Number falls in past yr: 0 0 0    Injury with Fall? 0 0 0    Risk for fall due to :  No Fall Risks No Fall Risks    Follow up Falls prevention discussed Falls prevention discussed Falls evaluation completed;Falls prevention discussed      FALL RISK PREVENTION PERTAINING TO THE HOME:  Any stairs in or around the home? Yes  If so, are there any without handrails? No  Home free of loose throw rugs in walkways, pet beds, electrical cords,  etc? Yes  Adequate lighting in your home to reduce risk of falls? Yes   ASSISTIVE DEVICES UTILIZED TO PREVENT FALLS:  Life alert? No  Use of a cane, walker or w/c? No  Grab bars in the bathroom? Yes  Shower chair or bench in shower? Yes  Elevated toilet seat or a handicapped toilet? No   TIMED UP AND GO:  Was the test performed? No .   Cognitive Function:    03/05/2020    1:23 PM 12/16/2017   10:00 AM 12/10/2016    11:28 AM 07/04/2015    9:30 AM  MMSE - Mini Mental State Exam  Orientation to time '5 5 5 5  '$ Orientation to Place '5 5 5 5  '$ Registration '3 3 3 3  '$ Attention/ Calculation 5 0 0 0  Recall '3 3 3 3  '$ Language- name 2 objects  0 0 0  Language- repeat '1 1 1 1  '$ Language- follow 3 step command  '3 3 3  '$ Language- read & follow direction  0 0 0  Write a sentence  0 0 0  Copy design  0 0 0  Total score  '20 20 20        '$ 03/31/2022   10:37 AM  6CIT Screen  What Year? 0 points  What month? 0 points  What time? 0 points  Count back from 20 0 points  Months in reverse 0 points  Repeat phrase 0 points  Total Score 0 points    Immunizations Immunization History  Administered Date(s) Administered   Fluad Quad(high Dose 65+) 01/25/2019, 03/19/2020   Influenza,inj,Quad PF,6+ Mos 12/13/2012   PFIZER(Purple Top)SARS-COV-2 Vaccination 04/03/2019, 04/26/2019, 02/20/2020   Pneumococcal Conjugate-13 07/04/2015   Pneumococcal Polysaccharide-23 10/04/2007, 04/28/2011   Td 03/16/2008   Zoster Recombinat (Shingrix) 10/15/2019, 12/15/2019   Zoster, Live 09/08/2013    TDAP status: Due, Education has been provided regarding the importance of this vaccine. Advised may receive this vaccine at local pharmacy or Health Dept. Aware to provide a copy of the vaccination record if obtained from local pharmacy or Health Dept. Verbalized acceptance and understanding.  Flu Vaccine status: Due, Education has been provided regarding the importance of this vaccine. Advised may receive this vaccine at local pharmacy or Health Dept. Aware to provide a copy of the vaccination record if obtained from local pharmacy or Health Dept. Verbalized acceptance and understanding.  Pneumococcal vaccine status: Up to date  Covid-19 vaccine status: Completed vaccines  Qualifies for Shingles Vaccine? Yes   Zostavax completed Yes   Shingrix Completed?: Yes  Screening Tests Health Maintenance  Topic Date Due   DTaP/Tdap/Td (2 -  Tdap) 03/16/2018   INFLUENZA VACCINE  10/14/2021   COVID-19 Vaccine (4 - 2023-24 season) 11/14/2021   Medicare Annual Wellness (AWV)  04/01/2023   Pneumonia Vaccine 42+ Years old  Completed   Zoster Vaccines- Shingrix  Completed   HPV VACCINES  Aged Out    Health Maintenance  Health Maintenance Due  Topic Date Due   DTaP/Tdap/Td (2 - Tdap) 03/16/2018   INFLUENZA VACCINE  10/14/2021   COVID-19 Vaccine (4 - 2023-24 season) 11/14/2021    Colorectal cancer screening: No longer required.    Additional Screening:   Vision Screening: Recommended annual ophthalmology exams for early detection of glaucoma and other disorders of the eye. Is the patient up to date with their annual eye exam?  Yes  Who is the provider or what is the name of the office in which the patient attends  annual eye exams? Dr Geryl Councilman  If pt is not established with a provider, would they like to be referred to a provider to establish care? No .   Dental Screening: Recommended annual dental exams for proper oral hygiene  Community Resource Referral / Chronic Care Management: CRR required this visit?  No   CCM required this visit?  No      Plan:     I have personally reviewed and noted the following in the patient's chart:   Medical and social history Use of alcohol, tobacco or illicit drugs  Current medications and supplements including opioid prescriptions. Patient is not currently taking opioid prescriptions. Functional ability and status Nutritional status Physical activity Advanced directives List of other physicians Hospitalizations, surgeries, and ER visits in previous 12 months Vitals Screenings to include cognitive, depression, and falls Referrals and appointments  In addition, I have reviewed and discussed with patient certain preventive protocols, quality metrics, and best practice recommendations. A written personalized care plan for preventive services as well as general preventive health  recommendations were provided to patient.     Willette Brace, LPN   0/51/1021   Nurse Notes: none

## 2022-03-31 NOTE — Patient Instructions (Signed)
Samuel Matthews , Thank you for taking time to come for your Medicare Wellness Visit. I appreciate your ongoing commitment to your health goals. Please review the following plan we discussed and let me know if I can assist you in the future.   These are the goals we discussed:  Goals      Increase physical activity     Starting 12/16/2017, I will continue to play golf 4-5 hours 3 days per week.      Patient Stated     03/05/2020, I will continue to play golf 2 days a week and walk for about 10,000-11,000 steps.     Patient Stated     Would like to maintain current routine and continue to be active     Patient Stated     Patient Stated     Stay healthy and active         This is a list of the screening recommended for you and due dates:  Health Maintenance  Topic Date Due   DTaP/Tdap/Td vaccine (2 - Tdap) 03/16/2018   Flu Shot  10/14/2021   COVID-19 Vaccine (4 - 2023-24 season) 11/14/2021   Medicare Annual Wellness Visit  04/01/2023   Pneumonia Vaccine  Completed   Zoster (Shingles) Vaccine  Completed   HPV Vaccine  Aged Out    Advanced directives: Please bring a copy of your health care power of attorney and living will to the office at your convenience.  Conditions/risks identified: stay active and healthy   Next appointment: Follow up in one year for your annual wellness visit.   Preventive Care 61 Years and Older, Male  Preventive care refers to lifestyle choices and visits with your health care provider that can promote health and wellness. What does preventive care include? A yearly physical exam. This is also called an annual well check. Dental exams once or twice a year. Routine eye exams. Ask your health care provider how often you should have your eyes checked. Personal lifestyle choices, including: Daily care of your teeth and gums. Regular physical activity. Eating a healthy diet. Avoiding tobacco and drug use. Limiting alcohol use. Practicing safe  sex. Taking low doses of aspirin every day. Taking vitamin and mineral supplements as recommended by your health care provider. What happens during an annual well check? The services and screenings done by your health care provider during your annual well check will depend on your age, overall health, lifestyle risk factors, and family history of disease. Counseling  Your health care provider may ask you questions about your: Alcohol use. Tobacco use. Drug use. Emotional well-being. Home and relationship well-being. Sexual activity. Eating habits. History of falls. Memory and ability to understand (cognition). Work and work Statistician. Screening  You may have the following tests or measurements: Height, weight, and BMI. Blood pressure. Lipid and cholesterol levels. These may be checked every 5 years, or more frequently if you are over 86 years old. Skin check. Lung cancer screening. You may have this screening every year starting at age 39 if you have a 30-pack-year history of smoking and currently smoke or have quit within the past 15 years. Fecal occult blood test (FOBT) of the stool. You may have this test every year starting at age 32. Flexible sigmoidoscopy or colonoscopy. You may have a sigmoidoscopy every 5 years or a colonoscopy every 10 years starting at age 40. Prostate cancer screening. Recommendations will vary depending on your family history and other risks. Hepatitis C blood test.  Hepatitis B blood test. Sexually transmitted disease (STD) testing. Diabetes screening. This is done by checking your blood sugar (glucose) after you have not eaten for a while (fasting). You may have this done every 1-3 years. Abdominal aortic aneurysm (AAA) screening. You may need this if you are a current or former smoker. Osteoporosis. You may be screened starting at age 79 if you are at high risk. Talk with your health care provider about your test results, treatment options, and if  necessary, the need for more tests. Vaccines  Your health care provider may recommend certain vaccines, such as: Influenza vaccine. This is recommended every year. Tetanus, diphtheria, and acellular pertussis (Tdap, Td) vaccine. You may need a Td booster every 10 years. Zoster vaccine. You may need this after age 33. Pneumococcal 13-valent conjugate (PCV13) vaccine. One dose is recommended after age 37. Pneumococcal polysaccharide (PPSV23) vaccine. One dose is recommended after age 38. Talk to your health care provider about which screenings and vaccines you need and how often you need them. This information is not intended to replace advice given to you by your health care provider. Make sure you discuss any questions you have with your health care provider. Document Released: 03/29/2015 Document Revised: 11/20/2015 Document Reviewed: 01/01/2015 Elsevier Interactive Patient Education  2017 [place] Prevention in the Home Falls can cause injuries. They can happen to people of all ages. There are many things you can do to make your home safe and to help prevent falls. What can I do on the outside of my home? Regularly fix the edges of walkways and driveways and fix any cracks. Remove anything that might make you trip as you walk through a door, such as a raised step or threshold. Trim any bushes or trees on the path to your home. Use bright outdoor lighting. Clear any walking paths of anything that might make someone trip, such as rocks or tools. Regularly check to see if handrails are loose or broken. Make sure that both sides of any steps have handrails. Any raised decks and porches should have guardrails on the edges. Have any leaves, snow, or ice cleared regularly. Use sand or salt on walking paths during winter. Clean up any spills in your garage right away. This includes oil or grease spills. What can I do in the bathroom? Use night lights. Install grab bars by the toilet  and in the tub and shower. Do not use towel bars as grab bars. Use non-skid mats or decals in the tub or shower. If you need to sit down in the shower, use a plastic, non-slip stool. Keep the floor dry. Clean up any water that spills on the floor as soon as it happens. Remove soap buildup in the tub or shower regularly. Attach bath mats securely with double-sided non-slip rug tape. Do not have throw rugs and other things on the floor that can make you trip. What can I do in the bedroom? Use night lights. Make sure that you have a light by your bed that is easy to reach. Do not use any sheets or blankets that are too big for your bed. They should not hang down onto the floor. Have a firm chair that has side arms. You can use this for support while you get dressed. Do not have throw rugs and other things on the floor that can make you trip. What can I do in the kitchen? Clean up any spills right away. Avoid walking on wet floors.  Keep items that you use a lot in easy-to-reach places. If you need to reach something above you, use a strong step stool that has a grab bar. Keep electrical cords out of the way. Do not use floor polish or wax that makes floors slippery. If you must use wax, use non-skid floor wax. Do not have throw rugs and other things on the floor that can make you trip. What can I do with my stairs? Do not leave any items on the stairs. Make sure that there are handrails on both sides of the stairs and use them. Fix handrails that are broken or loose. Make sure that handrails are as long as the stairways. Check any carpeting to make sure that it is firmly attached to the stairs. Fix any carpet that is loose or worn. Avoid having throw rugs at the top or bottom of the stairs. If you do have throw rugs, attach them to the floor with carpet tape. Make sure that you have a light switch at the top of the stairs and the bottom of the stairs. If you do not have them, ask someone to add  them for you. What else can I do to help prevent falls? Wear shoes that: Do not have high heels. Have rubber bottoms. Are comfortable and fit you well. Are closed at the toe. Do not wear sandals. If you use a stepladder: Make sure that it is fully opened. Do not climb a closed stepladder. Make sure that both sides of the stepladder are locked into place. Ask someone to hold it for you, if possible. Clearly mark and make sure that you can see: Any grab bars or handrails. First and last steps. Where the edge of each step is. Use tools that help you move around (mobility aids) if they are needed. These include: Canes. Walkers. Scooters. Crutches. Turn on the lights when you go into a dark area. Replace any light bulbs as soon as they burn out. Set up your furniture so you have a clear path. Avoid moving your furniture around. If any of your floors are uneven, fix them. If there are any pets around you, be aware of where they are. Review your medicines with your doctor. Some medicines can make you feel dizzy. This can increase your chance of falling. Ask your doctor what other things that you can do to help prevent falls. This information is not intended to replace advice given to you by your health care provider. Make sure you discuss any questions you have with your health care provider. Document Released: 12/27/2008 Document Revised: 08/08/2015 Document Reviewed: 04/06/2014 Elsevier Interactive Patient Education  2017 [employer].

## 2022-06-03 ENCOUNTER — Encounter: Payer: Self-pay | Admitting: Dermatology

## 2022-06-03 ENCOUNTER — Ambulatory Visit (INDEPENDENT_AMBULATORY_CARE_PROVIDER_SITE_OTHER): Payer: Medicare Other | Admitting: Dermatology

## 2022-06-03 VITALS — BP 137/65 | HR 71

## 2022-06-03 DIAGNOSIS — L02415 Cutaneous abscess of right lower limb: Secondary | ICD-10-CM

## 2022-06-03 DIAGNOSIS — L0291 Cutaneous abscess, unspecified: Secondary | ICD-10-CM

## 2022-06-03 DIAGNOSIS — L820 Inflamed seborrheic keratosis: Secondary | ICD-10-CM

## 2022-06-03 DIAGNOSIS — L729 Follicular cyst of the skin and subcutaneous tissue, unspecified: Secondary | ICD-10-CM | POA: Diagnosis not present

## 2022-06-03 MED ORDER — DOXYCYCLINE MONOHYDRATE 100 MG PO CAPS: 100.0000 mg | ORAL_CAPSULE | Freq: Two times a day (BID) | ORAL | 0 refills | Status: AC

## 2022-06-03 MED ORDER — MUPIROCIN 2 % EX OINT: 1.0000 | TOPICAL_OINTMENT | Freq: Two times a day (BID) | CUTANEOUS | 0 refills | Status: DC

## 2022-06-03 NOTE — Patient Instructions (Addendum)
 Pre-Operative Instructions  You are scheduled for a surgical procedure at [facility]. We recommend you read the following instructions. If you have any questions or concerns, please call the office at [phone].  Shower and wash the entire body with soap and water the day of your surgery paying special attention to cleansing at and around the planned surgery site.  Avoid aspirin or aspirin containing products at least fourteen (14) days prior to your surgical procedure and for at least one week (7 Days) after your surgical procedure. If you take aspirin on a regular basis for heart disease or history of stroke or for any other reason, we may recommend you continue taking aspirin but please notify us if you take this on a regular basis. Aspirin can cause more bleeding to occur during surgery as well as prolonged bleeding and bruising after surgery.   Avoid other nonsteroidal pain medications at least one week prior to surgery and at least one week prior to your surgery. These include medications such as Ibuprofen (Motrin, Advil and Nuprin), Naprosyn, Voltaren, Relafen, etc. If medications are used for therapeutic reasons, please inform us as they can cause increased bleeding or prolonged bleeding during and bruising after surgical procedures.   Please advise us if you are taking any "blood thinner" medications such as Coumadin or Dipyridamole or Plavix or similar medications. These cause increased bleeding and prolonged bleeding during procedures and bruising after surgical procedures. We may have to consider discontinuing these medications briefly prior to and shortly after your surgery if safe to do so.   Please inform us of all medications you are currently taking. All medications that are taken regularly should be taken the day of surgery as you always do. Nevertheless, we need to be informed of what medications you are taking prior to surgery to know whether they will affect the  procedure or cause any complications.   Please inform us of any medication allergies. Also inform us of whether you have allergies to Latex or rubber products or whether you have had any adverse reaction to Lidocaine or Epinephrine.  Please inform us of any prosthetic or artificial body parts such as artificial heart valve, joint replacements, etc., or similar condition that might require preoperative antibiotics.   We recommend avoidance of alcohol at least two weeks prior to surgery and continued avoidance for at least two weeks after surgery.   We recommend discontinuation of tobacco smoking at least two weeks prior to surgery and continued abstinence for at least two weeks after surgery.  Do not plan strenuous exercise, strenuous work or strenuous lifting for approximately four weeks after your surgery.   We request if you are unable to make your scheduled surgical appointment, please call us at least a week in advance or as soon as you are aware of a problem so that we can cancel or reschedule the appointment.   You MAY TAKE TYLENOL (acetaminophen) for pain as it is not a blood thinner.   PLEASE PLAN TO BE IN TOWN FOR TWO WEEKS FOLLOWING SURGERY, THIS IS IMPORTANT SO YOU CAN BE CHECKED FOR DRESSING CHANGES, SUTURE REMOVAL AND TO MONITOR FOR POSSIBLE COMPLICATIONS.    Due to recent changes in healthcare laws, you may see results of your pathology and/or laboratory studies on MyChart before the doctors have had a chance to review them. We understand that in some cases there may be results that are confusing or concerning to you. Please understand that not all results are   received at the same time and often the doctors may need to interpret multiple results in order to provide you with the best plan of care or course of treatment. Therefore, we ask that you please give us 2 business days to thoroughly review all your results before contacting the office for clarification. Should we see a  critical lab result, you will be contacted sooner.   If You Need Anything After Your Visit  If you have any questions or concerns for your doctor, please call our main line at [phone] and press option 4 to reach your doctor's medical assistant. If no one answers, please leave a voicemail as directed and we will return your call as soon as possible. Messages left after 4 pm will be answered the following business day.   You may also send us a message via MyChart. We typically respond to MyChart messages within 1-2 business days.  For prescription refills, please ask your pharmacy to contact our office. Our fax number is [phone].  If you have an urgent issue when the clinic is closed that cannot wait until the next business day, you can page your doctor at the number below.    Please note that while we do our best to be available for urgent issues outside of office hours, we are not available 24/7.   If you have an urgent issue and are unable to reach us, you may choose to seek medical care at your doctor's office, retail clinic, urgent care center, or emergency room.  If you have a medical emergency, please immediately call 911 or go to the emergency department.  Pager Numbers  - Dr. Kowalski: [phone]  - Dr. Moye: [phone]  - Dr. Stewart: [phone]  In the event of inclement weather, please call our main line at [phone] for an update on the status of any delays or closures.  Dermatology Medication Tips: Please keep the boxes that topical medications come in in order to help keep track of the instructions about where and how to use these. Pharmacies typically print the medication instructions only on the boxes and not directly on the medication tubes.   If your medication is too expensive, please contact our office at [phone] option 4 or send us a message through MyChart.   We are unable to tell what your co-pay for medications will be in advance as  this is different depending on your insurance coverage. However, we may be able to find a substitute medication at lower cost or fill out paperwork to get insurance to cover a needed medication.   If a prior authorization is required to get your medication covered by your insurance company, please allow us 1-2 business days to complete this process.  Drug prices often vary depending on where the prescription is filled and some pharmacies may offer cheaper prices.  The website www.goodrx.com contains coupons for medications through different pharmacies. The prices here do not account for what the cost may be with help from insurance (it may be cheaper with your insurance), but the website can give you the price if you did not use any insurance.  - You can print the associated coupon and take it with your prescription to the pharmacy.  - You may also stop by our office during regular business hours and pick up a GoodRx coupon card.  - If you need your prescription sent electronically to a different pharmacy, notify our office through [facility] MyChart or by phone at [phone] option 4.       Si Usted Necesita Algo Despus de Su Visita  Tambin puede enviarnos un mensaje a travs de MyChart. Por lo general respondemos a los mensajes de MyChart en el transcurso de 1 a 2 das hbiles.  Para renovar recetas, por favor pida a su farmacia que se ponga en contacto con nuestra oficina. Nuestro nmero de fax es el [phone].  Si tiene un asunto urgente cuando la clnica est cerrada y que no puede esperar hasta el siguiente da hbil, puede llamar/localizar a su doctor(a) al nmero que aparece a continuacin.   Por favor, tenga en cuenta que aunque hacemos todo lo posible para estar disponibles para asuntos urgentes fuera del horario de oficina, no estamos disponibles las 24 horas del da, los 7 das de la semana.   Si tiene un problema urgente y no puede comunicarse con nosotros, puede optar por  buscar atencin mdica  en el consultorio de su doctor(a), en una clnica privada, en un centro de atencin urgente o en una sala de emergencias.  Si tiene una emergencia mdica, por favor llame inmediatamente al 911 o vaya a la sala de emergencias.  Nmeros de bper  - Dr. Kowalski: [phone]  - Dra. Moye: [phone]  - Dra. Stewart: [phone]  En caso de inclemencias del tiempo, por favor llame a nuestra lnea principal al [phone] para una actualizacin sobre el estado de cualquier retraso o cierre.  Consejos para la medicacin en dermatologa: Por favor, guarde las cajas en las que vienen los medicamentos de uso tpico para ayudarle a seguir las instrucciones sobre dnde y cmo usarlos. Las farmacias generalmente imprimen las instrucciones del medicamento slo en las cajas y no directamente en los tubos del medicamento.   Si su medicamento es muy caro, por favor, pngase en contacto con nuestra oficina llamando al [phone] y presione la opcin 4 o envenos un mensaje a travs de MyChart.   No podemos decirle cul ser su copago por los medicamentos por adelantado ya que esto es diferente dependiendo de la cobertura de su seguro. Sin embargo, es posible que podamos encontrar un medicamento sustituto a menor costo o llenar un formulario para que el seguro cubra el medicamento que se considera necesario.   Si se requiere una autorizacin previa para que su compaa de seguros cubra su medicamento, por favor permtanos de 1 a 2 das hbiles para completar este proceso.  Los precios de los medicamentos varan con frecuencia dependiendo del lugar de dnde se surte la receta y alguna farmacias pueden ofrecer precios ms baratos.  El sitio web www.goodrx.com tiene cupones para medicamentos de diferentes farmacias. Los precios aqu no tienen en cuenta lo que podra costar con la ayuda del seguro (puede ser ms barato con su seguro), pero el sitio web puede darle el precio si no  utiliz ningn seguro.  - Puede imprimir el cupn correspondiente y llevarlo con su receta a la farmacia.  - Tambin puede pasar por nuestra oficina durante el horario de atencin regular y recoger una tarjeta de cupones de GoodRx.  - Si necesita que su receta se enve electrnicamente a una farmacia diferente, informe a nuestra oficina a travs de MyChart de [place] o por telfono llamando al [phone] y presione la opcin 4.  

## 2022-06-03 NOTE — Progress Notes (Signed)
   Follow-Up Visit   Subjective  Samuel Matthews is a 82 y.o. male who presents for the following: Spots Left anterior thigh x 2, for 1 month, itchy when first appeared, knot on L knee ~1 month, feels like a bb under skin, no symptoms, sore spot on R knee x 2 wks  The patient has spots, moles and lesions to be evaluated, some may be new or changing and the patient has concerns that these could be cancer.   The following portions of the chart were reviewed this encounter and updated as appropriate: medications, allergies, medical history  Review of Systems:  No other skin or systemic complaints except as noted in HPI or Assessment and Plan.  Objective  Well appearing patient in no apparent distress; mood and affect are within normal limits.  A focused examination was performed of the following areas: Left and right leg.    Assessment & Plan   INFLAMED SEBORRHEIC KERATOSIS  Symptomatic, irritating, patient would like treated.  Benign-appearing.  Call clinic for new or changing lesions.   Prior to procedure, discussed risks of blister formation, small wound, skin dyspigmentation, or rare scar following treatment. Recommend Vaseline ointment to treated areas while healing.  Destruction Procedure Note Destruction method: cryotherapy   Informed consent: discussed and consent obtained   Lesion destroyed using liquid nitrogen: Yes   Cryotherapy cycles:  1 Outcome: patient tolerated procedure well with no complications   Post-procedure details: wound care instructions given   Locations: L ant lat thigh # of Lesions Treated: 2   Cyst vs Other Exam: 1.0cm soft compressible sub q nodule L lat knee    Cyst vs other with symptoms and/or recent change.  Discussed surgical excision to remove, including resulting scar and possible recurrence.  Patient will schedule for surgery. Pre-op information given.     Abscess R lower knee firm erythematous nodule Start warm compresses bid Start  Mupirocin oint bid Start Doxycycline 100mg  1 po bid  x 14 days with food and drink  Doxycycline should be taken with food to prevent nausea. Do not lay down for 30 minutes after taking. Be cautious with sun exposure and use good sun protection while on this medication. Pregnant women should not take this medication.    Return for surgery 06/24/22 at 4:00  I, Sonya Hupman, RMA, am acting as scribe for Brendolyn Patty, MD .   Documentation: I have reviewed the above documentation for accuracy and completeness, and I agree with the above.  Brendolyn Patty, MD

## 2022-06-24 ENCOUNTER — Ambulatory Visit (INDEPENDENT_AMBULATORY_CARE_PROVIDER_SITE_OTHER): Payer: Medicare Other | Admitting: Dermatology

## 2022-06-24 VITALS — BP 118/62 | HR 59

## 2022-06-24 DIAGNOSIS — D485 Neoplasm of uncertain behavior of skin: Secondary | ICD-10-CM

## 2022-06-24 DIAGNOSIS — I8390 Asymptomatic varicose veins of unspecified lower extremity: Secondary | ICD-10-CM | POA: Diagnosis not present

## 2022-06-24 NOTE — Patient Instructions (Signed)
Wound Care Instructions for After Surgery  On the day following your surgery, you should begin doing daily dressing changes until your sutures are removed:  Remove the bandage.  Cleanse the wound gently with soap and water.   Make sure you then dry the skin surrounding the wound completely or the tape will not stick to the skin. Do not use cotton balls on the wound.  After the wound is clean and dry, apply the ointment (either prescription antibiotic prescribed by your doctor or plain Vaseline if nothing was prescribed) gently with a Q-tip.  If you are using a bandaid to cover:  Apply a bandaid large enough to cover the entire wound.  If you do not have a bandaid large enough to cover the wound OR if you are sensitive to bandaid adhesive:  Cut a non-stick pad (such as Telfa) to fit the size of the wound.   Cover the wound with the non-stick pad.  If the wound is draining, you may want to add a small amount of gauze on top of the non-stick pad for a little added compression to the area.  Use tape to seal the area completely.  For the next 1-2 weeks:  Be sure to keep the wound moist with ointment 24/7 to ensure best healing. If you are unable to cover the wound with a bandage to hold the ointment in place, you may need to reapply the ointment several times a day.  Do not bend over or lift heavy items to reduce the chance of elevated blood pressure to the wound.  Do not participate in particularly strenuous activities.  Below is a list of dressing supplies you might need.   Cotton-tipped applicators - Q-tips  Gauze pads (2x2 and/or 4x4) - All-Purpose Sponges  New and clean tube of petroleum jelly (Vaseline) OR prescription antibiotic ointment if prescribed  Either a bandaid large enough to cover the entire wound OR non-stick dressing material (Telfa) and Tape (Paper or Hypafix)  FOR ADULT SURGERY PATIENTS: If you need something for pain relief, you may take 1 extra strength  Tylenol (acetaminophen) and 2 ibuprofen (200 mg) together every 4 hours as needed. (Do not take these medications if you are allergic to them or if you know you cannot take them for any other reason). Typically you may only need pain medication for 1-3 days.   Comments on the Post-Operative Period Slight swelling and redness often appear around the wound. This is normal and will disappear within several days following the surgery. The healing wound will drain a brownish-red-yellow discharge during healing. This is a normal phase of wound healing. As the wound begins to heal, the drainage may increase in amount. Again, this drainage is normal. Notify us if the drainage becomes persistently bloody, excessively swollen, or intensely painful or develops a foul odor or red streaks.  The healing wound will also typically be itchy. This is normal. If you have severe or persistent pain, Notify us if the discomfort is severe or persistent. Avoid alcoholic beverages when taking pain medicine.  In Case of Wound Hemorrhage A wound hemorrhage is when the bandage suddenly becomes soaked with bright red blood and flows profusely. If this happens, sit down or lie down with your head elevated. If the wound has a dressing on it, do not remove the dressing. Apply pressure to the existing gauze. If the wound is not covered, use a gauze pad to apply pressure and continue applying the pressure for 20 minutes without   peeking. DO NOT COVER THE WOUND WITH A LARGE TOWEL OR WASH CLOTH. Release your hand from the wound site but do not remove the dressing. If the bleeding has stopped, gently clean around the wound. Leave the dressing in place for 24 hours if possible. This wait time allows the blood vessels to close off so that you do not spark a new round of bleeding by disrupting the newly clotted blood vessels with an immediate dressing change. If the bleeding does not subside, continue to hold pressure for 40 minutes. If bleeding  continues, page your physician, contact an After Hours clinic or go to the Emergency Room. ?  

## 2022-06-24 NOTE — Progress Notes (Signed)
   Follow-Up Visit   Subjective  Samuel Matthews is a 82 y.o. male who presents for the following: Procedure (Cyst vs other of the left lateral knee. Patient presents for excision. ).   The following portions of the chart were reviewed this encounter and updated as appropriate:       Review of Systems:  No other skin or systemic complaints except as noted in HPI or Assessment and Plan.  Objective  Well appearing patient in no apparent distress; mood and affect are within normal limits.  A focused examination was performed including L knee. Relevant physical exam findings are noted in the Assessment and Plan.  left lateral knee 1.1 CM soft compressible subcutaneous nodule    Assessment & Plan  Neoplasm of uncertain behavior of skin left lateral knee  Skin excision  Lesion length (cm):  1.1 Lesion width (cm):  1.1 Total excision diameter (cm):  1.1 Informed consent: discussed and consent obtained   Timeout: patient name, date of birth, surgical site, and procedure verified   Procedure prep:  Patient was prepped and draped in usual sterile fashion Prep type:  Povidone-iodine Anesthesia: the lesion was anesthetized in a standard fashion   Anesthetic:  1% lidocaine w/ epinephrine 1-100,000 buffered w/ 8.4% NaHCO3 (Total 3cc lidocaine w/epi) Instrument used: #15 blade   Hemostasis achieved with: pressure and electrodesiccation   Outcome: patient tolerated procedure well with no complications   Additional details:  Soft violaceous nodule removed  Skin repair Complexity:  Intermediate Final length (cm):  1.2 Informed consent: discussed and consent obtained   Timeout: patient name, date of birth, surgical site, and procedure verified   Reason for type of repair: reduce tension to allow closure, reduce the risk of dehiscence, infection, and necrosis, reduce subcutaneous dead space and avoid a hematoma, preserve normal anatomical and functional relationships and enhance both  functionality and cosmetic results   Undermining: edges undermined   Subcutaneous layers (deep stitches):  Suture size:  4-0 Suture type: Vicryl (polyglactin 910)   Stitches:  Buried vertical mattress Fine/surface layer approximation (top stitches):  Suture size:  4-0 Suture type: nylon   Stitches: simple interrupted   Suture removal (days):  7 Hemostasis achieved with: pressure Outcome: patient tolerated procedure well with no complications   Post-procedure details: sterile dressing applied and wound care instructions given   Dressing type: pressure dressing (Mupirocin)    Specimen 1 - Surgical pathology Differential Diagnosis: hidrocystoma vs varicosity vs other cyst Check Margins: No 1.1 CM soft compressible subcutaneous nodule   Return in about 8 days (around 07/02/2022) for suture removal.  Documentation: I have reviewed the above documentation for accuracy and completeness, and I agree with the above.  Willeen Niece MD

## 2022-06-25 ENCOUNTER — Telehealth: Payer: Self-pay

## 2022-06-25 NOTE — Telephone Encounter (Signed)
LVM for patient checking on him after yesterday's surgery. Butch Penny., RMA

## 2022-06-30 ENCOUNTER — Telehealth: Payer: Self-pay

## 2022-06-30 NOTE — Telephone Encounter (Signed)
Advised pt of bx result/sh ?

## 2022-06-30 NOTE — Telephone Encounter (Signed)
-----   Message from Willeen Niece, MD sent at 06/30/2022  5:40 PM EDT ----- Skin (M), left lateral knee VARIX  Benign dilated vein (varicosity) - please call patient

## 2022-07-02 ENCOUNTER — Ambulatory Visit (INDEPENDENT_AMBULATORY_CARE_PROVIDER_SITE_OTHER): Payer: Medicare Other

## 2022-07-02 DIAGNOSIS — Z4802 Encounter for removal of sutures: Secondary | ICD-10-CM

## 2022-07-02 NOTE — Progress Notes (Signed)
Encounter for Removal of Sutures - Incision site at the left lateral knee is clean, dry and intact - Wound cleansed, sutures removed, wound cleansed and steri strips applied.  - Discussed pathology results showing benign dilated vein, patient advised.  - Patient advised to keep steri-strips dry until they fall off. - Scars remodel for a full year. - Once steri-strips fall off, patient can apply over-the-counter silicone scar cream each night to help with scar remodeling if desired. - Patient advised to call with any concerns or if they notice any new or changing lesions.  Dorathy Daft, RMA

## 2022-08-25 ENCOUNTER — Ambulatory Visit (INDEPENDENT_AMBULATORY_CARE_PROVIDER_SITE_OTHER): Payer: Medicare Other | Admitting: Dermatology

## 2022-08-25 ENCOUNTER — Encounter: Payer: Self-pay | Admitting: Dermatology

## 2022-08-25 VITALS — BP 120/61 | HR 63

## 2022-08-25 DIAGNOSIS — Z1283 Encounter for screening for malignant neoplasm of skin: Secondary | ICD-10-CM

## 2022-08-25 DIAGNOSIS — D225 Melanocytic nevi of trunk: Secondary | ICD-10-CM

## 2022-08-25 DIAGNOSIS — L821 Other seborrheic keratosis: Secondary | ICD-10-CM

## 2022-08-25 DIAGNOSIS — X32XXXA Exposure to sunlight, initial encounter: Secondary | ICD-10-CM

## 2022-08-25 DIAGNOSIS — L814 Other melanin hyperpigmentation: Secondary | ICD-10-CM | POA: Diagnosis not present

## 2022-08-25 DIAGNOSIS — D489 Neoplasm of uncertain behavior, unspecified: Secondary | ICD-10-CM

## 2022-08-25 DIAGNOSIS — Z86006 Personal history of melanoma in-situ: Secondary | ICD-10-CM

## 2022-08-25 DIAGNOSIS — Z872 Personal history of diseases of the skin and subcutaneous tissue: Secondary | ICD-10-CM

## 2022-08-25 DIAGNOSIS — L820 Inflamed seborrheic keratosis: Secondary | ICD-10-CM

## 2022-08-25 DIAGNOSIS — Z85828 Personal history of other malignant neoplasm of skin: Secondary | ICD-10-CM | POA: Diagnosis not present

## 2022-08-25 DIAGNOSIS — L570 Actinic keratosis: Secondary | ICD-10-CM

## 2022-08-25 DIAGNOSIS — D229 Melanocytic nevi, unspecified: Secondary | ICD-10-CM

## 2022-08-25 DIAGNOSIS — W908XXA Exposure to other nonionizing radiation, initial encounter: Secondary | ICD-10-CM | POA: Diagnosis not present

## 2022-08-25 DIAGNOSIS — C44729 Squamous cell carcinoma of skin of left lower limb, including hip: Secondary | ICD-10-CM | POA: Diagnosis not present

## 2022-08-25 DIAGNOSIS — L578 Other skin changes due to chronic exposure to nonionizing radiation: Secondary | ICD-10-CM | POA: Diagnosis not present

## 2022-08-25 NOTE — Patient Instructions (Addendum)
Biopsy Wound Care Instructions  Leave the original bandage on for 24 hours if possible.  If the bandage becomes soaked or soiled before that time, it is OK to remove it and examine the wound.  A small amount of post-operative bleeding is normal.  If excessive bleeding occurs, remove the bandage, place gauze over the site and apply continuous pressure (no peeking) over the area for 30 minutes. If this does not work, please call our clinic as soon as possible or page your doctor if it is after hours.   Once a day, cleanse the wound with soap and water. It is fine to shower. If a thick crust develops you may use a Q-tip dipped into dilute hydrogen peroxide (mix 1:1 with water) to dissolve it.  Hydrogen peroxide can slow the healing process, so use it only as needed.    After washing, apply petroleum jelly (Vaseline) or an antibiotic ointment if your doctor prescribed one for you, followed by a bandage.    For best healing, the wound should be covered with a layer of ointment at all times. If you are not able to keep the area covered with a bandage to hold the ointment in place, this may mean re-applying the ointment several times a day.  Continue this wound care until the wound has healed and is no longer open.   Itching and mild discomfort is normal during the healing process. However, if you develop pain or severe itching, please call our office.   If you have any discomfort, you can take Tylenol (acetaminophen) or ibuprofen as directed on the bottle. (Please do not take these if you have an allergy to them or cannot take them for another reason).  Some redness, tenderness and white or yellow material in the wound is normal healing.  If the area becomes very sore and red, or develops a thick yellow-green material (pus), it may be infected; please notify us.    If you have stitches, return to clinic as directed to have the stitches removed. You will continue wound care for 2-3 days after the stitches  are removed.   Wound healing continues for up to one year following surgery. It is not unusual to experience pain in the scar from time to time during the interval.  If the pain becomes severe or the scar thickens, you should notify the office.    A slight amount of redness in a scar is expected for the first six months.  After six months, the redness will fade and the scar will soften and fade.  The color difference becomes less noticeable with time.  If there are any problems, return for a post-op surgery check at your earliest convenience.  To improve the appearance of the scar, you can use silicone scar gel, cream, or sheets (such as Mederma or Serica) every night for up to one year. These are available over the counter (without a prescription).  Please call our office at [phone] for any questions or concerns.    Electrodesiccation and Curettage ("Scrape and Burn") Wound Care Instructions  Leave the original bandage on for 24 hours if possible.  If the bandage becomes soaked or soiled before that time, it is OK to remove it and examine the wound.  A small amount of post-operative bleeding is normal.  If excessive bleeding occurs, remove the bandage, place gauze over the site and apply continuous pressure (no peeking) over the area for 30 minutes. If this does not work, please call   our clinic as soon as possible or page your doctor if it is after hours.   Once a day, cleanse the wound with soap and water. It is fine to shower. If a thick crust develops you may use a Q-tip dipped into dilute hydrogen peroxide (mix 1:1 with water) to dissolve it.  Hydrogen peroxide can slow the healing process, so use it only as needed.    After washing, apply petroleum jelly (Vaseline) or an antibiotic ointment if your doctor prescribed one for you, followed by a bandage.    For best healing, the wound should be covered with a layer of ointment at all times. If you are not able to keep the area covered  with a bandage to hold the ointment in place, this may mean re-applying the ointment several times a day.  Continue this wound care until the wound has healed and is no longer open. It may take several weeks for the wound to heal and close.  Itching and mild discomfort is normal during the healing process.  If you have any discomfort, you can take Tylenol (acetaminophen) or ibuprofen as directed on the bottle. (Please do not take these if you have an allergy to them or cannot take them for another reason).  Some redness, tenderness and white or yellow material in the wound is normal healing.  If the area becomes very sore and red, or develops a thick yellow-green material (pus), it may be infected; please notify us.    Wound healing continues for up to one year following surgery. It is not unusual to experience pain in the scar from time to time during the interval.  If the pain becomes severe or the scar thickens, you should notify the office.    A slight amount of redness in a scar is expected for the first six months.  After six months, the redness will fade and the scar will soften and fade.  The color difference becomes less noticeable with time.  If there are any problems, return for a post-op surgery check at your earliest convenience.  To improve the appearance of the scar, you can use silicone scar gel, cream, or sheets (such as Mederma or Serica) every night for up to one year. These are available over the counter (without a prescription).  Please call our office at [phone] for any questions or concerns.   Actinic keratoses are precancerous spots that appear secondary to cumulative UV radiation exposure/sun exposure over time. They are chronic with expected duration over 1 year. A portion of actinic keratoses will progress to squamous cell carcinoma of the skin. It is not possible to reliably predict which spots will progress to skin cancer and so treatment is recommended to prevent  development of skin cancer.  Recommend daily broad spectrum sunscreen SPF 30+ to sun-exposed areas, reapply every 2 hours as needed.  Recommend staying in the shade or wearing long sleeves, sun glasses (UVA+UVB protection) and wide brim hats (4-inch brim around the entire circumference of the hat). Call for new or changing lesions.   Cryotherapy Aftercare  Wash gently with soap and water everyday.   Apply Vaseline and Band-Aid daily until healed.   Melanoma ABCDEs  Melanoma is the most dangerous type of skin cancer, and is the leading cause of death from skin disease.  You are more likely to develop melanoma if you: Have light-colored skin, light-colored eyes, or red or blond hair Spend a lot of time in the sun Tan regularly, either   outdoors or in a tanning bed Have had blistering sunburns, especially during childhood Have a close family member who has had a melanoma Have atypical moles or large birthmarks  Early detection of melanoma is key since treatment is typically straightforward and cure rates are extremely high if we catch it early.   The first sign of melanoma is often a change in a mole or a new dark spot.  The ABCDE system is a way of remembering the signs of melanoma.  A for asymmetry:  The two halves do not match. B for border:  The edges of the growth are irregular. C for color:  A mixture of colors are present instead of an even brown color. D for diameter:  Melanomas are usually (but not always) greater than 6mm - the size of a pencil eraser. E for evolution:  The spot keeps changing in size, shape, and color.  Please check your skin once per month between visits. You can use a small mirror in front and a large mirror behind you to keep an eye on the back side or your body.   If you see any new or changing lesions before your next follow-up, please call to schedule a visit.  Please continue daily skin protection including broad spectrum sunscreen SPF 30+ to  sun-exposed areas, reapplying every 2 hours as needed when you're outdoors.   Staying in the shade or wearing long sleeves, sun glasses (UVA+UVB protection) and wide brim hats (4-inch brim around the entire circumference of the hat) are also recommended for sun protection.    Due to recent changes in healthcare laws, you may see results of your pathology and/or laboratory studies on MyChart before the doctors have had a chance to review them. We understand that in some cases there may be results that are confusing or concerning to you. Please understand that not all results are received at the same time and often the doctors may need to interpret multiple results in order to provide you with the best plan of care or course of treatment. Therefore, we ask that you please give us 2 business days to thoroughly review all your results before contacting the office for clarification. Should we see a critical lab result, you will be contacted sooner.   If You Need Anything After Your Visit  If you have any questions or concerns for your doctor, please call our main line at [phone] and press option 4 to reach your doctor's medical assistant. If no one answers, please leave a voicemail as directed and we will return your call as soon as possible. Messages left after 4 pm will be answered the following business day.   You may also send us a message via MyChart. We typically respond to MyChart messages within 1-2 business days.  For prescription refills, please ask your pharmacy to contact our office. Our fax number is [phone].  If you have an urgent issue when the clinic is closed that cannot wait until the next business day, you can page your doctor at the number below.    Please note that while we do our best to be available for urgent issues outside of office hours, we are not available 24/7.   If you have an urgent issue and are unable to reach us, you may choose to seek medical care at your  doctor's office, retail clinic, urgent care center, or emergency room.  If you have a medical emergency, please immediately call 911 or go to the   emergency department.  Pager Numbers  - Dr. Kowalski: [phone]  - Dr. Moye: [phone]  - Dr. Stewart: [phone]  In the event of inclement weather, please call our main line at [phone] for an update on the status of any delays or closures.  Dermatology Medication Tips: Please keep the boxes that topical medications come in in order to help keep track of the instructions about where and how to use these. Pharmacies typically print the medication instructions only on the boxes and not directly on the medication tubes.   If your medication is too expensive, please contact our office at [phone] option 4 or send us a message through MyChart.   We are unable to tell what your co-pay for medications will be in advance as this is different depending on your insurance coverage. However, we may be able to find a substitute medication at lower cost or fill out paperwork to get insurance to cover a needed medication.   If a prior authorization is required to get your medication covered by your insurance company, please allow us 1-2 business days to complete this process.  Drug prices often vary depending on where the prescription is filled and some pharmacies may offer cheaper prices.  The website www.goodrx.com contains coupons for medications through different pharmacies. The prices here do not account for what the cost may be with help from insurance (it may be cheaper with your insurance), but the website can give you the price if you did not use any insurance.  - You can print the associated coupon and take it with your prescription to the pharmacy.  - You may also stop by our office during regular business hours and pick up a GoodRx coupon card.  - If you need your prescription sent electronically to a different pharmacy, notify  our office through [facility] MyChart or by phone at [phone] option 4.     Si Usted Necesita Algo Despus de Su Visita  Tambin puede enviarnos un mensaje a travs de MyChart. Por lo general respondemos a los mensajes de MyChart en el transcurso de 1 a 2 das hbiles.  Para renovar recetas, por favor pida a su farmacia que se ponga en contacto con nuestra oficina. Nuestro nmero de fax es el [phone].  Si tiene un asunto urgente cuando la clnica est cerrada y que no puede esperar hasta el siguiente da hbil, puede llamar/localizar a su doctor(a) al nmero que aparece a continuacin.   Por favor, tenga en cuenta que aunque hacemos todo lo posible para estar disponibles para asuntos urgentes fuera del horario de oficina, no estamos disponibles las 24 horas del da, los 7 das de la semana.   Si tiene un problema urgente y no puede comunicarse con nosotros, puede optar por buscar atencin mdica  en el consultorio de su doctor(a), en una clnica privada, en un centro de atencin urgente o en una sala de emergencias.  Si tiene una emergencia mdica, por favor llame inmediatamente al 911 o vaya a la sala de emergencias.  Nmeros de bper  - Dr. Kowalski: [phone]  - Dra. Moye: [phone]  - Dra. Stewart: [phone]  En caso de inclemencias del tiempo, por favor llame a nuestra lnea principal al [phone] para una actualizacin sobre el estado de cualquier retraso o cierre.  Consejos para la medicacin en dermatologa: Por favor, guarde las cajas en las que vienen los medicamentos de uso tpico para ayudarle a seguir las instrucciones sobre dnde y cmo usarlos. Las farmacias   generalmente imprimen las instrucciones del medicamento slo en las cajas y no directamente en los tubos del medicamento.   Si su medicamento es muy caro, por favor, pngase en contacto con nuestra oficina llamando al [phone] y presione la opcin 4 o envenos un mensaje a travs de  MyChart.   No podemos decirle cul ser su copago por los medicamentos por adelantado ya que esto es diferente dependiendo de la cobertura de su seguro. Sin embargo, es posible que podamos encontrar un medicamento sustituto a menor costo o llenar un formulario para que el seguro cubra el medicamento que se considera necesario.   Si se requiere una autorizacin previa para que su compaa de seguros cubra su medicamento, por favor permtanos de 1 a 2 das hbiles para completar este proceso.  Los precios de los medicamentos varan con frecuencia dependiendo del lugar de dnde se surte la receta y alguna farmacias pueden ofrecer precios ms baratos.  El sitio web www.goodrx.com tiene cupones para medicamentos de diferentes farmacias. Los precios aqu no tienen en cuenta lo que podra costar con la ayuda del seguro (puede ser ms barato con su seguro), pero el sitio web puede darle el precio si no utiliz ningn seguro.  - Puede imprimir el cupn correspondiente y llevarlo con su receta a la farmacia.  - Tambin puede pasar por nuestra oficina durante el horario de atencin regular y recoger una tarjeta de cupones de GoodRx.  - Si necesita que su receta se enve electrnicamente a una farmacia diferente, informe a nuestra oficina a travs de MyChart de [facility] o por telfono llamando al [phone] y presione la opcin 4.  

## 2022-08-25 NOTE — Progress Notes (Signed)
Follow-Up Visit   Subjective  Samuel Matthews is a 82 y.o. male who presents for the following: Skin Cancer Screening and Full Body Skin Exam Has new bump below knee on right knee and new bump above left knee- growing Hx of scc, hx of bcc, hx of mmis, hx of aks  The patient presents for Total-Body Skin Exam (TBSE) for skin cancer screening and mole check. The patient has spots, moles and lesions to be evaluated, some may be new or changing and the patient has concerns that these could be cancer.    The following portions of the chart were reviewed this encounter and updated as appropriate: medications, allergies, medical history  Review of Systems:  No other skin or systemic complaints except as noted in HPI or Assessment and Plan.  Objective  Well appearing patient in no apparent distress; mood and affect are within normal limits.  A full examination was performed including scalp, head, eyes, ears, nose, lips, neck, chest, axillae, abdomen, back, buttocks, bilateral upper extremities, bilateral lower extremities, hands, feet, fingers, toes, fingernails, and toenails. All findings within normal limits unless otherwise noted below.   Relevant physical exam findings are noted in the Assessment and Plan.  left upper knee 10 mm pink scaly papule        right inferior knee x 1 Erythematous stuck-on, waxy papule  crown scalp x 7, Right temple x2, left temple x 1 (10) Erythematous thin papules/macules with gritty scale.     Assessment & Plan   LENTIGINES, SEBORRHEIC KERATOSES, HEMANGIOMAS - Benign normal skin lesions - Benign-appearing - Call for any changes  MELANOCYTIC NEVI - Tan-brown and/or pink-flesh-colored symmetric macules and papules - Benign appearing on exam today - Observation - Call clinic for new or changing moles - Recommend daily use of broad spectrum spf 30+ sunscreen to sun-exposed areas.  Right lower back benign nevus    ACTINIC DAMAGE - Chronic  condition, secondary to cumulative UV/sun exposure - diffuse scaly erythematous macules with underlying dyspigmentation - Recommend daily broad spectrum sunscreen SPF 30+ to sun-exposed areas, reapply every 2 hours as needed.  - Staying in the shade or wearing long sleeves, sun glasses (UVA+UVB protection) and wide brim hats (4-inch brim around the entire circumference of the hat) are also recommended for sun protection.  - Call for new or changing lesions.  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  - Call if any new or changing lesions are noted between office visits   SKIN CANCER SCREENING PERFORMED TODAY.   Neoplasm of uncertain behavior left upper knee  Epidermal / dermal shaving  Lesion diameter (cm):  1  Informed consent: discussed and consent obtained   Patient was prepped and draped in usual sterile fashion: Area prepped with alcohol. Anesthesia: the lesion was anesthetized in a standard fashion   Anesthetic:  1% lidocaine w/ epinephrine 1-100,000 buffered w/ 8.4% NaHCO3 Instrument used: flexible razor blade   Hemostasis achieved  with: pressure, aluminum chloride and electrodesiccation   Outcome: patient tolerated procedure well    Destruction of lesion  Destruction method: electrodesiccation and curettage   Informed consent: discussed and consent obtained   Curettage performed in three different directions: Yes   Electrodesiccation performed over the curetted area: Yes   Final wound size (cm):  1.1 Hemostasis achieved with:  pressure, aluminum chloride and electrodesiccation Outcome: patient tolerated procedure well with no complications   Post-procedure details: wound care instructions given   Additional details:  Mupirocin ointment and Bandaid applied   Specimen 1 - Surgical pathology Differential Diagnosis: Hypertrophic ak vs scc  Check Margins: No  Hypertrophic ak vs scc  Inflamed seborrheic keratosis right inferior knee x 1  Inflamed isk vs hypertrophic ak at right inferior knee   Symptomatic, irritating, patient would like treated.  Recheck on f/up  Destruction of lesion - right inferior knee x 1  Destruction method: cryotherapy   Informed consent: discussed and consent obtained   Lesion destroyed using liquid nitrogen: Yes   Region frozen until ice ball extended beyond lesion: Yes   Outcome: patient tolerated procedure well with no complications   Post-procedure details: wound care instructions given   Additional details:  Prior to procedure, discussed risks of blister formation, small wound, skin dyspigmentation, or rare scar following cryotherapy. Recommend Vaseline ointment to treated areas while healing.   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 They are chronic with expected duration over 1 year. A portion of actinic keratoses will progress to squamous cell carcinoma of the skin. It is not  possible to reliably predict which spots will progress to skin cancer and so treatment is recommended to prevent development of skin cancer.  Recommend daily broad spectrum sunscreen SPF 30+ to sun-exposed areas, reapply every 2 hours as needed.  Recommend staying in the shade or wearing long sleeves, sun glasses (UVA+UVB protection) and wide brim hats (4-inch brim around the entire circumference of the hat). Call for new or changing lesions.  Destruction of lesion - crown scalp x 7, Right temple x2, left temple x 1  Destruction method: cryotherapy   Informed consent: discussed and consent obtained   Lesion destroyed using liquid nitrogen: Yes   Region frozen until ice ball extended beyond lesion: Yes   Outcome: patient tolerated procedure well with no complications   Post-procedure details: wound care instructions given   Additional details:  Prior to procedure, discussed risks of blister formation, small wound, skin dyspigmentation, or rare scar following cryotherapy. Recommend Vaseline ointment to treated areas while healing.    Return in about 6 months (around 02/24/2023) for TBSE.  I, Asher Muir, CMA, am acting as scribe for Willeen Niece, MD.   Documentation: I have reviewed the above documentation for accuracy and completeness, and I agree with the above.  Willeen Niece, MD

## 2022-09-01 ENCOUNTER — Telehealth: Payer: Self-pay

## 2022-09-01 NOTE — Telephone Encounter (Signed)
Advised patient biopsy of the left upper knee was SCC and has already been treated with EDC. Will recheck at follow-up appt.

## 2022-09-01 NOTE — Telephone Encounter (Signed)
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

## 2022-09-03 ENCOUNTER — Ambulatory Visit (INDEPENDENT_AMBULATORY_CARE_PROVIDER_SITE_OTHER): Payer: Medicare Other | Admitting: Urology

## 2022-09-03 ENCOUNTER — Encounter: Payer: Self-pay | Admitting: Urology

## 2022-09-03 VITALS — BP 121/66 | HR 89 | Ht 71.0 in | Wt 195.8 lb

## 2022-09-03 DIAGNOSIS — N138 Other obstructive and reflux uropathy: Secondary | ICD-10-CM

## 2022-09-03 DIAGNOSIS — R339 Retention of urine, unspecified: Secondary | ICD-10-CM | POA: Diagnosis not present

## 2022-09-03 DIAGNOSIS — N401 Enlarged prostate with lower urinary tract symptoms: Secondary | ICD-10-CM

## 2022-09-03 LAB — BLADDER SCAN AMB NON-IMAGING

## 2022-09-03 MED ORDER — TAMSULOSIN HCL 0.4 MG PO CAPS: 0.4000 mg | ORAL_CAPSULE | Freq: Every day | ORAL | 3 refills | Status: DC

## 2022-09-03 NOTE — Progress Notes (Signed)
   09/03/2022 12:54 PM   Amanda Cockayne Feb 26, 1941 [id number]  Reason for visit: Follow up BPH and urinary retention  HPI: Healthy 82 year old male who I saw on 06/24/2021 for urinary retention with inability to void and PVR of 550 mL.  He denied any significant urinary symptoms prior to developing retention.  He was started on Flomax, and Foley catheter was placed.  PSA was normal at 2.9 in 2017, and DRE April 2023 showed an 80 g smooth prostate with no nodules or masses.  He passed a voiding trial in follow-up.  He does have a history of similar symptoms with difficulty emptying when he has a few alcoholic beverages.  He has been doing well since that time on the Flomax, and continues to urinate with a strong stream and really denies any urinary complaints today.  PVR today is normal at 7ml.  Risk and benefits of continuing Flomax discussed, and I think it would be reasonable for him to stop the Flomax and see if he has any worsening in urinary symptoms.  He would like to continue the Flomax for now, but may consider stopping it in the future.  Return precautions were discussed at length  Flomax refilled, can be filled by PCP moving forward.  Okay to trial off Flomax if patient desires Return precautions discussed, follow-up with urology as needed  Sondra Come, MD  [facility] [address], [address] [place], [place] [address] [phone]

## 2023-02-01 ENCOUNTER — Inpatient Hospital Stay: Payer: Medicare Other | Admitting: Family Medicine

## 2023-02-23 ENCOUNTER — Encounter: Payer: Self-pay | Admitting: Dermatology

## 2023-02-23 ENCOUNTER — Ambulatory Visit (INDEPENDENT_AMBULATORY_CARE_PROVIDER_SITE_OTHER): Payer: Medicare Other | Admitting: Dermatology

## 2023-02-23 DIAGNOSIS — L814 Other melanin hyperpigmentation: Secondary | ICD-10-CM

## 2023-02-23 DIAGNOSIS — Z86006 Personal history of melanoma in-situ: Secondary | ICD-10-CM

## 2023-02-23 DIAGNOSIS — Z1283 Encounter for screening for malignant neoplasm of skin: Secondary | ICD-10-CM | POA: Diagnosis not present

## 2023-02-23 DIAGNOSIS — L820 Inflamed seborrheic keratosis: Secondary | ICD-10-CM | POA: Diagnosis not present

## 2023-02-23 DIAGNOSIS — L821 Other seborrheic keratosis: Secondary | ICD-10-CM

## 2023-02-23 DIAGNOSIS — S0001XA Abrasion of scalp, initial encounter: Secondary | ICD-10-CM | POA: Diagnosis not present

## 2023-02-23 DIAGNOSIS — Z85828 Personal history of other malignant neoplasm of skin: Secondary | ICD-10-CM | POA: Diagnosis not present

## 2023-02-23 DIAGNOSIS — D229 Melanocytic nevi, unspecified: Secondary | ICD-10-CM

## 2023-02-23 DIAGNOSIS — D1801 Hemangioma of skin and subcutaneous tissue: Secondary | ICD-10-CM

## 2023-02-23 DIAGNOSIS — L578 Other skin changes due to chronic exposure to nonionizing radiation: Secondary | ICD-10-CM

## 2023-02-23 DIAGNOSIS — W908XXA Exposure to other nonionizing radiation, initial encounter: Secondary | ICD-10-CM | POA: Diagnosis not present

## 2023-02-23 DIAGNOSIS — L720 Epidermal cyst: Secondary | ICD-10-CM | POA: Diagnosis not present

## 2023-02-23 DIAGNOSIS — L729 Follicular cyst of the skin and subcutaneous tissue, unspecified: Secondary | ICD-10-CM

## 2023-02-23 DIAGNOSIS — L570 Actinic keratosis: Secondary | ICD-10-CM | POA: Diagnosis not present

## 2023-02-23 NOTE — Patient Instructions (Addendum)
Cryotherapy Aftercare  Wash gently with soap and water everyday.   Apply Vaseline jelly daily until healed.    Recommend daily broad spectrum sunscreen SPF 30+ to sun-exposed areas, reapply every 2 hours as needed. Call for new or changing lesions.  Staying in the shade or wearing long sleeves, sun glasses (UVA+UVB protection) and wide brim hats (4-inch brim around the entire circumference of the hat) are also recommended for sun protection.      Melanoma ABCDEs  Melanoma is the most dangerous type of skin cancer, and is the leading cause of death from skin disease.  You are more likely to develop melanoma if you: Have light-colored skin, light-colored eyes, or red or blond hair Spend a lot of time in the sun Tan regularly, either outdoors or in a tanning bed Have had blistering sunburns, especially during childhood Have a close family member who has had a melanoma Have atypical moles or large birthmarks  Early detection of melanoma is key since treatment is typically straightforward and cure rates are extremely high if we catch it early.   The first sign of melanoma is often a change in a mole or a new dark spot.  The ABCDE system is a way of remembering the signs of melanoma.  A for asymmetry:  The two halves do not match. B for border:  The edges of the growth are irregular. C for color:  A mixture of colors are present instead of an even brown color. D for diameter:  Melanomas are usually (but not always) greater than 6mm - the size of a pencil eraser. E for evolution:  The spot keeps changing in size, shape, and color.  Please check your skin once per month between visits. You can use a small mirror in front and a large mirror behind you to keep an eye on the back side or your body.   If you see any new or changing lesions before your next follow-up, please call to schedule a visit.  Please continue daily skin protection including broad spectrum sunscreen SPF 30+ to  sun-exposed areas, reapplying every 2 hours as needed when you're outdoors.   Staying in the shade or wearing long sleeves, sun glasses (UVA+UVB protection) and wide brim hats (4-inch brim around the entire circumference of the hat) are also recommended for sun protection.      Due to recent changes in healthcare laws, you may see results of your pathology and/or laboratory studies on MyChart before the doctors have had a chance to review them. We understand that in some cases there may be results that are confusing or concerning to you. Please understand that not all results are received at the same time and often the doctors may need to interpret multiple results in order to provide you with the best plan of care or course of treatment. Therefore, we ask that you please give [place] 2 business days to thoroughly review all your results before contacting the office for clarification. Should we see a critical lab result, you will be contacted sooner.   If You Need Anything After Your Visit  If you have any questions or concerns for your doctor, please call our main line at [phone] and press option 4 to reach your doctor's medical assistant. If no one answers, please leave a voicemail as directed and we will return your call as soon as possible. Messages left after 4 pm will be answered the following business day.   You may also send [place]  a message via MyChart. We typically respond to MyChart messages within 1-2 business days.  For prescription refills, please ask your pharmacy to contact our office. Our fax number is [phone].  If you have an urgent issue when the clinic is closed that cannot wait until the next business day, you can page your doctor at the number below.    Please note that while we do our best to be available for urgent issues outside of office hours, we are not available 24/7.   If you have an urgent issue and are unable to reach [place], you may choose to seek medical care at  your doctor's office, retail clinic, urgent care center, or emergency room.  If you have a medical emergency, please immediately call 911 or go to the emergency department.  Pager Numbers  - Dr. Gwen Pounds: [phone]  - Dr. Roseanne Reno: [phone]  - Dr. Katrinka Blazing: [phone]   In the event of inclement weather, please call our main line at [phone] for an update on the status of any delays or closures.  Dermatology Medication Tips: Please keep the boxes that topical medications come in in order to help keep track of the instructions about where and how to use these. Pharmacies typically print the medication instructions only on the boxes and not directly on the medication tubes.   If your medication is too expensive, please contact our office at [phone] option 4 or send [place] a message through MyChart.   We are unable to tell what your co-pay for medications will be in advance as this is different depending on your insurance coverage. However, we may be able to find a substitute medication at lower cost or fill out paperwork to get insurance to cover a needed medication.   If a prior authorization is required to get your medication covered by your insurance company, please allow [place] 1-2 business days to complete this process.  Drug prices often vary depending on where the prescription is filled and some pharmacies may offer cheaper prices.  The website www.goodrx.com contains coupons for medications through different pharmacies. The prices here do not account for what the cost may be with help from insurance (it may be cheaper with your insurance), but the website can give you the price if you did not use any insurance.  - You can print the associated coupon and take it with your prescription to the pharmacy.  - You may also stop by our office during regular business hours and pick up a GoodRx coupon card.  - If you need your prescription sent electronically to a different pharmacy,  notify our office through [facility] or by phone at [phone] option 4.     Si Usted Necesita Algo Despus de Su Visita  Tambin puede enviarnos un mensaje a travs de Clinical cytogeneticist. Por lo general respondemos a los mensajes de MyChart en el transcurso de 1 a 2 das hbiles.  Para renovar recetas, por favor pida a su farmacia que se ponga en contacto con nuestra oficina. Annie Sable de fax es [place] [phone].  Si tiene un asunto urgente cuando la clnica est cerrada y que no puede esperar hasta el siguiente da hbil, puede llamar/localizar a su doctor(a) al nmero que aparece a continuacin.   Por favor, tenga en cuenta que aunque hacemos todo lo posible para estar disponibles para asuntos urgentes fuera del horario de [place], no estamos disponibles las 24 horas del da, los 7 [address]  [address] de la [place].   Si tiene un  problema urgente y no puede comunicarse con nosotros, puede optar por buscar atencin mdica  en el consultorio de su doctor(a), en una clnica privada, en un centro de atencin urgente o en una sala de emergencias.  Si tiene Engineer, drilling, por favor llame inmediatamente al 911 o vaya a la sala de emergencias.  Nmeros de bper  - Dr. Gwen Pounds: [phone]  - Dra. Roseanne Reno: [phone]  - Dr. Katrinka Blazing: [phone]   En caso de inclemencias del tiempo, por favor llame a Lacy Duverney principal al [phone] para una actualizacin sobre el [place] de cualquier retraso o cierre.  Consejos para la medicacin en dermatologa: Por favor, guarde las cajas en las que vienen los medicamentos de uso tpico para ayudarle a seguir las instrucciones sobre dnde y cmo usarlos. Las farmacias generalmente imprimen las instrucciones del medicamento slo en las cajas y no directamente en los tubos del [place].   Si su medicamento es muy caro, por favor, pngase en contacto con Rolm Gala llamando al [phone] y presione la opcin 4 o envenos un mensaje a travs de  Clinical cytogeneticist.   No podemos decirle cul ser su copago por los medicamentos por adelantado ya que esto es diferente dependiendo de la cobertura de su seguro. Sin embargo, es posible que podamos encontrar un medicamento sustituto a Audiological scientist un formulario para que el seguro cubra el medicamento que se considera necesario.   Si se requiere una autorizacin previa para que su compaa de seguros [place] su medicamento, por favor permtanos de 1 a 2 das hbiles para completar [address]  Los precios de los medicamentos varan con frecuencia dependiendo del Environmental consultant de dnde se surte la receta y alguna farmacias pueden ofrecer precios ms baratos.  El sitio web www.goodrx.com tiene cupones para medicamentos de Health and safety inspector. Los precios aqu no tienen en cuenta lo que podra costar con la ayuda del seguro (puede ser ms barato con su seguro), pero el sitio web puede darle el precio si no utiliz Tourist information centre manager.  - Puede imprimir el cupn correspondiente y llevarlo con su receta a la farmacia.  - Tambin puede pasar por nuestra oficina durante el horario de atencin regular y Education officer, museum una tarjeta de cupones de GoodRx.  - Si necesita que su receta se enve electrnicamente a una farmacia diferente, informe a nuestra oficina a travs de MyChart de [place] o por telfono llamando al [phone] y presione la opcin 4.

## 2023-02-23 NOTE — Progress Notes (Signed)
Follow-Up Visit   Subjective  Samuel Matthews is a 82 y.o. male who presents for the following: Skin Cancer Screening and Full Body Skin Exam. Hx of MIS x4 sites. Hx of BCCs, Hx of SCCs.   Bump on back of neck, left shoulder discoloration. Spots on scalp. Hits head a lot on golf cart. Itchy spot on back at R shoulder. New spot L medial shoulder he wants checked.  The patient presents for Total-Body Skin Exam (TBSE) for skin cancer screening and mole check. The patient has spots, moles and lesions to be evaluated, some may be new or changing and the patient may have concern these could be cancer.    The following portions of the chart were reviewed this encounter and updated as appropriate: medications, allergies, medical history  Review of Systems:  No other skin or systemic complaints except as noted in HPI or Assessment and Plan.  Objective  Well appearing patient in no apparent distress; mood and affect are within normal limits.  A full examination was performed including scalp, head, eyes, ears, nose, lips, neck, chest, axillae, abdomen, back, buttocks, bilateral upper extremities, bilateral lower extremities, hands, feet, fingers, toes, fingernails, and toenails. All findings within normal limits unless otherwise noted below.   Relevant physical exam findings are noted in the Assessment and Plan.  Right Posterior Shoulder x2 (2) Erythematous keratotic or waxy stuck-on papule  scalp x7 (7) Erythematous hyperkeratotic papules with gritty scale.     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 Spinal upper back (Mohs, Dr. Adriana Simas) 12/12/2013. Right posterior shoulder 01/17/2018. Left spinal upper back 09/18/2020. - No evidence of recurrence today - Recommend regular full body skin exams - Recommend daily broad spectrum sunscreen SPF 30+ to sun-exposed areas, reapply every 2 hours as needed.  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 Right lateral calf 07/04/2018. Left upper knee 08/25/2022.  - No evidence of recurrence today - No lymphadenopathy - Recommend regular full body skin exams - Recommend daily broad spectrum sunscreen SPF 30+ to sun-exposed areas, reapply every 2 hours as needed.  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 - No evidence of recurrence today - Recommend regular full body skin exams - Recommend daily broad spectrum sunscreen SPF 30+ to sun-exposed areas, reapply every 2 hours as needed.  - Call if any new or changing lesions are noted between office visits    SKIN CANCER SCREENING PERFORMED TODAY.  ACTINIC DAMAGE. Face, chest, back, arms. - Chronic condition, secondary to cumulative UV/sun exposure - diffuse scaly erythematous macules with underlying dyspigmentation - Recommend daily broad spectrum sunscreen SPF 30+ to sun-exposed areas, reapply every 2 hours as needed.  - Staying in the shade or wearing long sleeves, sun glasses (UVA+UVB protection) and wide brim hats (4-inch brim around the entire circumference of the hat) are also recommended for sun protection.  - Call for new or changing lesions.  LENTIGINES, SEBORRHEIC KERATOSES, HEMANGIOMAS - Benign normal skin lesions. Neck, back, arms, face. - Benign-appearing - Call for any changes  SEBORRHEIC KERATOSIS - Stuck-on, waxy, tan papule at left medial shoulder - Benign-appearing - Discussed benign etiology and prognosis. - Observe - Call for any changes  MELANOCYTIC NEVI - Tan-brown and/or pink-flesh-colored symmetric  macules and papules - Benign appearing on exam today - Observation - Call clinic for new or changing moles - Recommend daily use of broad spectrum spf 30+ sunscreen to sun-exposed areas.   EPIDERMAL INCLUSION CYST Exam: Subcutaneous  firm nodule at left posterior neck  Benign-appearing. Exam most consistent with an epidermal inclusion cyst. Discussed that a cyst is a benign growth that can grow over time and sometimes get irritated or inflamed. Recommend observation if it is not bothersome. Discussed option of surgical excision to remove it if it is growing, symptomatic, or other changes noted. Please call for new or changing lesions so they can be evaluated.   Excoriation  Exam: crusted excoriated lesion at mid crown scalp, pt recently hit head on golf cart  Treatment: apply Vaseline Jelly twice a day until healed, RTC if doesn't heal    Inflamed seborrheic keratosis (2) Right Posterior Shoulder x2  Symptomatic, irritating, patient would like treated.  Destruction of lesion - Right Posterior Shoulder x2 (2)  Destruction method: cryotherapy   Informed consent: discussed and consent obtained   Lesion destroyed using liquid nitrogen: Yes   Region frozen until ice ball extended beyond lesion: Yes   Outcome: patient tolerated procedure well with no complications   Post-procedure details: wound care instructions given   Additional details:  Prior to procedure, discussed risks of blister formation, small wound, skin dyspigmentation, or rare scar following cryotherapy. Recommend Vaseline ointment to treated areas while healing.   Hypertrophic actinic keratosis (7) scalp x7  Actinic keratoses are precancerous spots that appear secondary to cumulative UV radiation exposure/sun exposure over time. They are chronic with expected duration over 1 year. A portion of actinic keratoses will progress to squamous cell carcinoma of the skin. It is not possible to reliably predict which spots will progress to skin cancer and so treatment is recommended to prevent development of skin cancer.  Recommend daily broad spectrum sunscreen SPF 30+ to sun-exposed areas, reapply every 2 hours as needed.  Recommend staying in the shade or  wearing long sleeves, sun glasses (UVA+UVB protection) and wide brim hats (4-inch brim around the entire circumference of the hat). Call for new or changing lesions.  Destruction of lesion - scalp x7 (7)  Destruction method: cryotherapy   Informed consent: discussed and consent obtained   Lesion destroyed using liquid nitrogen: Yes   Region frozen until ice ball extended beyond lesion: Yes   Outcome: patient tolerated procedure well with no complications   Post-procedure details: wound care instructions given   Additional details:  Prior to procedure, discussed risks of blister formation, small wound, skin dyspigmentation, or rare scar following cryotherapy. Recommend Vaseline ointment to treated areas while healing.    Return in about 6 months (around 08/24/2023) for TBSE, HxMIS, HxSCC, HxBCC.  I, Lawson Radar, CMA, am acting as scribe for Willeen Niece, MD.   Documentation: I have reviewed the above documentation for accuracy and completeness, and I agree with the above.  Willeen Niece, MD

## 2023-04-01 DIAGNOSIS — Z961 Presence of intraocular lens: Secondary | ICD-10-CM | POA: Diagnosis not present

## 2023-04-02 ENCOUNTER — Ambulatory Visit (INDEPENDENT_AMBULATORY_CARE_PROVIDER_SITE_OTHER): Payer: Medicare Other

## 2023-04-02 VITALS — BP 122/74 | Ht 71.0 in | Wt 196.2 lb

## 2023-04-02 DIAGNOSIS — Z0000 Encounter for general adult medical examination without abnormal findings: Secondary | ICD-10-CM

## 2023-04-02 NOTE — Progress Notes (Signed)
Subjective:   Samuel Matthews is a 83 y.o. male who presents for Medicare Annual/Subsequent preventive examination.  Visit Complete: In person  Patient Medicare AWV questionnaire was completed by the patient on 03/29/23; I have confirmed that all information answered by patient is correct and no changes since this date.  Cardiac Risk Factors include: advanced age (>52men, >28 women);male gender    Objective:    Today's Vitals   04/02/23 1046  BP: 122/74  Weight: 196 lb 3.2 oz (89 kg)  Height: 5\' 11"  (1.803 m)  PainSc: 0-No pain   Body mass index is 27.36 kg/m.     04/02/2023   11:12 AM 03/31/2022   10:35 AM 03/15/2021   12:10 PM 03/11/2021    1:20 PM 04/22/2020    6:00 PM 04/22/2020    5:00 PM 04/22/2020   12:01 PM  Advanced Directives  Does Patient Have a Medical Advance Directive? Yes Yes Yes Yes Yes  Yes  Type of Estate agent of [place];Living will Healthcare Power of [place];Living will Healthcare Power of [place];Living will Healthcare Power of [place];Living will Healthcare Power of [place];Living will Healthcare Power of [place];Living will Healthcare Power of [place];Living will  Does patient want to make changes to medical advance directive?    Yes (MAU/Ambulatory/Procedural Areas - Information given) No - Patient declined    Copy of Healthcare Power of Attorney in Chart? No - copy requested No - copy requested     No - copy requested    Current Medications (verified) Outpatient Encounter Medications as of 04/02/2023  Medication Sig   Multiple Vitamin (MULTIVITAMIN ADULT PO) Take by mouth.   mupirocin ointment (BACTROBAN) 2 % Apply 1 Application topically 2 (two) times daily. Bid to R knee until healed   tamsulosin (FLOMAX) 0.4 MG CAPS capsule Take 1 capsule (0.4 mg total) by mouth daily.   No facility-administered encounter medications on file as of 04/02/2023.    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/03/2013   Loanne Drilling, MD   TOTAL HIP ARTHROPLASTY Right 04/22/2020   Procedure: TOTAL HIP ARTHROPLASTY ANTERIOR APPROACH;  Surgeon: Ollen Gross, MD;  Location: [facility];  Service: Orthopedics;  Laterality: Right;    TRIGGER FINGER RELEASE  2001   Family History  Problem Relation Age of Onset   Coronary artery disease Mother 14   Hypertension Father    CVA Father    Healthy Sister    Healthy Brother    Diabetes Neg Hx    Cancer Neg Hx    Social History   Socioeconomic History   Marital status: Married    Spouse name: Not on file   Number of children: Not on file   Years of education: Not on file   Highest education level: Not on file  Occupational History   Occupation: Retired  Tobacco Use   Smoking status: Never    Passive exposure: Never   Smokeless tobacco: Never  Vaping Use   Vaping status: Never Used  Substance and Sexual Activity   Alcohol use: Yes    Comment: Occasional   Drug use: No   Sexual activity: Yes  Other Topics Concern   Not on file  Social History Narrative   Caffeine: 2-3 cups coffee/day   Lives with wife in [place], son in [place]   Occupation: retired, Production designer, theatre/television/film with [employer], now working at Systems analyst course   Edu: 4 yrs college   Activity: Emergency planning/management officer 3d/wk at [employer], gym twice weekly.    Diet: fruits/vegetables daily, 2x/wk red meat, fish 1x/wk   Social Drivers of Health   Financial Resource Strain: Low Risk  (04/02/2023)   Overall Financial Resource Strain (CARDIA)    Difficulty of Paying Living Expenses: Not hard at all  Food Insecurity: No Food Insecurity (04/02/2023)   Hunger Vital Sign    Worried About Running Out of Food in the Last Year: Never true    Ran Out of Food in the Last Year: Never true  Transportation Needs: No Transportation Needs (04/02/2023)   PRAPARE - Administrator, Civil Service (Medical): No    Lack of Transportation (Non-Medical): No  Physical Activity: Sufficiently Active (04/02/2023)   Exercise Vital Sign    Days of Exercise per Week: 4 days    Minutes of Exercise per Session: 150+ min  Stress: No Stress Concern  Present (04/02/2023)   [employer] of Occupational Health - Occupational Stress Questionnaire    Feeling of Stress : Not at all  Social Connections: Moderately Integrated (04/02/2023)   Social Connection and Isolation Panel [NHANES]    Frequency of Communication with Friends and Family: More than three times a week    Frequency of Social Gatherings with Friends and Family: More than three times a week    Attends Religious Services: Never  Active Member of Clubs or Organizations: Yes    Attends Banker Meetings: 1 to 4 times per year    Marital Status: Married    Tobacco Counseling Counseling given: Not Answered  Clinical Intake:  Pre-visit preparation completed: Yes  Pain : No/denies pain Pain Score: 0-No pain   BMI - recorded: 27.36 Nutritional Status: BMI 25 -29 Overweight Nutritional Risks: None Diabetes: No  How often do you need to have someone help you when you read instructions, pamphlets, or other written materials from your doctor or pharmacy?: 1 - Never  Interpreter Needed?: No  Comments: Lives with wife Information entered by :: B.Karlisa Gaubert,LPN   Activities of Daily Living    03/29/2023    3:25 PM  In your present state of health, do you have any difficulty performing the following activities:  Hearing? 0  Vision? 0  Difficulty concentrating or making decisions? 0  Walking or climbing stairs? 0  Dressing or bathing? 0  Doing errands, shopping? 0  Preparing Food and eating ? N  Using the Toilet? N  In the past six months, have you accidently leaked urine? N  Do you have problems with loss of bowel control? N  Managing your Medications? N  Managing your Finances? N  Housekeeping or managing your Housekeeping? N    Patient Care Team: Eustaquio Boyden, MD as PCP - General (Family Medicine)  Indicate any recent Medical Services you may have received from other than [facility] providers in the past year (date may be approximate).      Assessment:   This is a routine wellness examination for [place].  Hearing/Vision screen Hearing Screening - Comments:: Pt says his hearing is good Vision Screening - Comments:: Pt says his vision is 20/20  [facility]   Goals Addressed             This Visit's Progress    COMPLETED: Increase physical activity   On track    Starting 12/16/2017, I will continue to play golf 4-5 hours 3 days per week.      COMPLETED: Patient Stated   On track    03/05/2020, I will continue to play golf 2 days a week and walk for about 10,000-11,000 steps.     COMPLETED: Patient Stated   On track    Would like to maintain current routine and continue to be active     Patient Stated   On track    04/02/23-Stay healthy and active        Depression Screen    04/02/2023   11:09 AM 03/31/2022   10:34 AM 03/11/2021    1:23 PM 03/05/2020    1:20 PM 01/25/2019    3:13 PM 12/16/2017   10:00 AM 12/10/2016   11:29 AM  PHQ 2/9 Scores  PHQ - 2 Score 0 0 0 0 0 0 0  PHQ- 9 Score    0  0 0    Fall Risk    03/29/2023    3:25 PM 03/31/2022   10:36 AM 03/11/2021    1:22 PM 03/05/2020    1:18 PM 02/08/2019   11:20 AM  Fall Risk   Falls in the past year? 1 0 0 0 0  Comment     Emmi Telephone Survey: data to providers prior to load  Number falls in past yr:  0 0 0   Injury with Fall? 0 0 0 0   Risk for fall due to : No Fall Risks  No  Fall Risks No Fall Risks   Follow up Falls prevention discussed;Education provided Falls prevention discussed Falls prevention discussed Falls evaluation completed;Falls prevention discussed     MEDICARE RISK AT HOME: Medicare Risk at Home Any stairs in or around the home?: (Patient-Rptd) Yes If so, are there any without handrails?: (Patient-Rptd) Yes Home free of loose throw rugs in walkways, pet beds, electrical cords, etc?: (Patient-Rptd) No Adequate lighting in your home to reduce risk of falls?: (Patient-Rptd) Yes Life alert?: (Patient-Rptd) No Use of a cane,  walker or w/c?: (Patient-Rptd) No Grab bars in the bathroom?: (Patient-Rptd) No Shower chair or bench in shower?: (Patient-Rptd) Yes Elevated toilet seat or a handicapped toilet?: (Patient-Rptd) No  TIMED UP AND GO:  Was the test performed?  Yes  Length of time to ambulate 10 feet: 10 sec Gait steady and fast without use of assistive device    Cognitive Function:    03/05/2020    1:23 PM 12/16/2017   10:00 AM 12/10/2016   11:28 AM 07/04/2015    9:30 AM  MMSE - Mini Mental State Exam  Orientation to time 5 5 5 5   Orientation to Place 5 5 5 5   Registration 3 3 3 3   Attention/ Calculation 5 0 0 0  Recall 3 3 3 3   Language- name 2 objects  0 0 0  Language- repeat 1 1 1 1   Language- follow 3 step command  3 3 3   Language- read & follow direction  0 0 0  Write a sentence  0 0 0  Copy design  0 0 0  Total score  20 20 20         04/02/2023   11:14 AM 03/31/2022   10:37 AM  6CIT Screen  What Year? 0 points 0 points  What month? 0 points 0 points  What time? 0 points 0 points  Count back from 20 0 points 0 points  Months in reverse 0 points 0 points  Repeat phrase 0 points 0 points  Total Score 0 points 0 points    Immunizations Immunization History  Administered Date(s) Administered   Fluad Quad(high Dose 65+) 01/25/2019, 03/19/2020   Influenza,inj,Quad PF,6+ Mos 12/13/2012   PFIZER(Purple Top)SARS-COV-2 Vaccination 04/03/2019, 04/26/2019, 02/20/2020   Pneumococcal Conjugate-13 07/04/2015   Pneumococcal Polysaccharide-23 10/04/2007, 04/28/2011   Td 03/16/2008   Zoster Recombinant(Shingrix) 10/15/2019, 12/15/2019   Zoster, Live 09/08/2013    TDAP status: Up to date  Flu Vaccine status: Declined, Education has been provided regarding the importance of this vaccine but patient still declined. Advised may receive this vaccine at local pharmacy or Health Dept. Aware to provide a copy of the vaccination record if obtained from local pharmacy or Health Dept. Verbalized  acceptance and understanding.  Pneumococcal vaccine status: Up to date  Covid-19 vaccine status: Completed vaccines  Qualifies for Shingles Vaccine? Yes   Zostavax completed Yes   Shingrix Completed?: Yes  Screening Tests Health Maintenance  Topic Date Due   DTaP/Tdap/Td (2 - Tdap) 03/16/2018   INFLUENZA VACCINE  10/15/2022   COVID-19 Vaccine (4 - 2024-25 season) 11/15/2022   Medicare Annual Wellness (AWV)  04/01/2024   Pneumonia Vaccine 22+ Years old  Completed   Zoster Vaccines- Shingrix  Completed   HPV VACCINES  Aged Out    Health Maintenance  Health Maintenance Due  Topic Date Due   DTaP/Tdap/Td (2 - Tdap) 03/16/2018   INFLUENZA VACCINE  10/15/2022   COVID-19 Vaccine (4 - 2024-25 season) 11/15/2022    Colorectal  cancer screening: No longer required.   Lung Cancer Screening: (Low Dose CT Chest recommended if Age 55-80 years, 20 pack-year currently smoking OR have quit w/in 15years.) does not qualify.   Lung Cancer Screening Referral: no  Additional Screening:  Hepatitis C Screening: does not qualify; Completed no  Vision Screening: Recommended annual ophthalmology exams for early detection of glaucoma and other disorders of the eye. Is the patient up to date with their annual eye exam?  Yes  Who is the provider or what is the name of the office in which the patient attends annual eye exams? Dr Jean Rosenthal If pt is not established with a provider, would they like to be referred to a provider to establish care? No .   Dental Screening: Recommended annual dental exams for proper oral hygiene  Diabetic Foot Exam: n/a  Community Resource Referral / Chronic Care Management: CRR required this visit?  No   CCM required this visit?  Appt scheduled with PCP for PE    Plan:     I have personally reviewed and noted the following in the patient's chart:   Medical and social history Use of alcohol, tobacco or illicit drugs  Current medications and supplements  including opioid prescriptions. Patient is not currently taking opioid prescriptions. Functional ability and status Nutritional status Physical activity Advanced directives List of other physicians Hospitalizations, surgeries, and ER visits in previous 12 months Vitals Screenings to include cognitive, depression, and falls Referrals and appointments  In addition, I have reviewed and discussed with patient certain preventive protocols, quality metrics, and best practice recommendations. A written personalized care plan for preventive services as well as general preventive health recommendations were provided to patient.    Sue Lush, LPN   1/61/0960   After Visit Summary: (MyChart) Due to this being a telephonic visit, the after visit summary with patients personalized plan was offered to patient via MyChart   Nurse Notes: The patient states he is doing well and has no concerns or questions at this time.

## 2023-04-02 NOTE — Patient Instructions (Addendum)
[name] , Thank you for taking time to come for your Medicare Wellness Visit. I appreciate your ongoing commitment to your health goals. Please review the following plan we discussed and let me know if I can assist you in the future.   Referrals/Orders/Follow-Ups/Clinician Recommendations: none  This is a list of the screening recommended for you and due dates:  Health Maintenance  Topic Date Due   DTaP/Tdap/Td vaccine (2 - Tdap) 03/16/2018   Flu Shot  10/15/2022   COVID-19 Vaccine (4 - 2024-25 season) 11/15/2022   Medicare Annual Wellness Visit  04/01/2024   Pneumonia Vaccine  Completed   Zoster (Shingles) Vaccine  Completed   HPV Vaccine  Aged Out    Advanced directives: (Copy Requested) Please bring a copy of your health care power of attorney and living will to the office to be added to your chart at your convenience.  Next Medicare Annual Wellness Visit scheduled for next year: Yes 04/04/2024 @10 :50am in person

## 2023-05-29 ENCOUNTER — Other Ambulatory Visit: Payer: Self-pay | Admitting: Family Medicine

## 2023-05-29 DIAGNOSIS — Z131 Encounter for screening for diabetes mellitus: Secondary | ICD-10-CM

## 2023-05-29 DIAGNOSIS — Z13220 Encounter for screening for lipoid disorders: Secondary | ICD-10-CM

## 2023-06-01 ENCOUNTER — Other Ambulatory Visit (INDEPENDENT_AMBULATORY_CARE_PROVIDER_SITE_OTHER)

## 2023-06-01 DIAGNOSIS — Z131 Encounter for screening for diabetes mellitus: Secondary | ICD-10-CM | POA: Diagnosis not present

## 2023-06-01 DIAGNOSIS — Z136 Encounter for screening for cardiovascular disorders: Secondary | ICD-10-CM | POA: Diagnosis not present

## 2023-06-01 DIAGNOSIS — Z13220 Encounter for screening for lipoid disorders: Secondary | ICD-10-CM | POA: Diagnosis not present

## 2023-06-01 LAB — LIPID PANEL
Cholesterol: 163 mg/dL (ref 0–200)
HDL: 59.8 mg/dL (ref >39.00)
LDL Cholesterol: 95 mg/dL (ref 0–99)
NonHDL: 103.61
Total CHOL/HDL Ratio: 3
Triglycerides: 45 mg/dL (ref 0.0–149.0)
VLDL: 9 mg/dL (ref 0.0–40.0)

## 2023-06-01 LAB — BASIC METABOLIC PANEL
BUN: 16 mg/dL (ref 6–23)
CO2: 28 meq/L (ref 19–32)
Calcium: 9.5 mg/dL (ref 8.4–10.5)
Chloride: 106 meq/L (ref 96–112)
Creatinine, Ser: 1.01 mg/dL (ref 0.40–1.50)
GFR: 67.96 mL/min (ref >60.00)
Glucose, Bld: 100 mg/dL — ABNORMAL HIGH (ref 70–99)
Potassium: 4.1 meq/L (ref 3.5–5.1)
Sodium: 140 meq/L (ref 135–145)

## 2023-06-09 ENCOUNTER — Ambulatory Visit (INDEPENDENT_AMBULATORY_CARE_PROVIDER_SITE_OTHER): Payer: Medicare Other | Admitting: Family Medicine

## 2023-06-09 ENCOUNTER — Encounter: Payer: Self-pay | Admitting: Family Medicine

## 2023-06-09 VITALS — BP 138/70 | HR 63 | Temp 97.9°F | Ht 68.5 in | Wt 200.1 lb

## 2023-06-09 DIAGNOSIS — Z23 Encounter for immunization: Secondary | ICD-10-CM | POA: Diagnosis not present

## 2023-06-09 DIAGNOSIS — Z7189 Other specified counseling: Secondary | ICD-10-CM | POA: Diagnosis not present

## 2023-06-09 DIAGNOSIS — Z86006 Personal history of melanoma in-situ: Secondary | ICD-10-CM

## 2023-06-09 DIAGNOSIS — N401 Enlarged prostate with lower urinary tract symptoms: Secondary | ICD-10-CM

## 2023-06-09 DIAGNOSIS — R338 Other retention of urine: Secondary | ICD-10-CM

## 2023-06-09 DIAGNOSIS — N400 Enlarged prostate without lower urinary tract symptoms: Secondary | ICD-10-CM | POA: Insufficient documentation

## 2023-06-09 NOTE — Assessment & Plan Note (Addendum)
 H/o melanoma x4 - regularly sees dermatology (Dr Willeen Niece)

## 2023-06-09 NOTE — Patient Instructions (Addendum)
 Prevnar-20 today  Good to see o[facility] today Return as needed or in 1 year for next physical

## 2023-06-09 NOTE — Assessment & Plan Note (Signed)
 Previously discussed.

## 2023-06-09 NOTE — Progress Notes (Signed)
 Ph: [phone] Fax: [phone]   Patient ID: Samuel Matthews, male    DOB: 1940-10-07, 83 y.o.   MRN: [id number]  This visit was conducted in person.  BP 138/70   Pulse 63   Temp 97.9 F (36.6 C) (Oral)   Ht 5' 8.5" (1.74 m)   Wt 200 lb 2 oz (90.8 kg)   SpO2 98%   BMI 29.99 kg/m    CC: AMW f/u visit  Subjective:   HPI: Samuel Matthews is a 83 y.o. male presenting on 06/09/2023 for Annual Exam (MCR prt 2 [AWV- 04/02/23].)   I last saw patient 2022.  Saw health advisor 03/2023 for medicare wellness visit. Note reviewed.   Continues playing golf, is a starter at [employer] - works 2 days a week.  Regularly active at [employer] once a week - rower, leg press, arm machine.   No results found.  Flowsheet Row Clinical Support from [facility]/02/2023 in [facility] [place]  PHQ-2 Total Score 0          03/29/2023    3:25 PM 03/31/2022   10:36 AM 03/11/2021    1:22 PM 03/05/2020    1:18 PM 02/08/2019   11:20 AM  Fall Risk   Falls in the past year? 1 0 0 0 0  Comment     Emmi Telephone Survey: data to providers prior to load  Number falls in past yr:  0 0 0   Injury with Fall? 0 0 0 0   Risk for fall due to : No Fall Risks  No Fall Risks No Fall Risks   Follow up Falls prevention discussed;Education provided Falls prevention discussed Falls prevention discussed Falls evaluation completed;Falls prevention discussed   Tripped while putting air in tires [place], hit left forehead with abrasion - overall recovered well. This was ~08/2022  Saw Dr Richardo Hanks urology for BPH - suggested PCP fill flomax in future, f/u with uro PRN. He diced to trial off flomax - and is doing well off of this.   Preventative: COLONOSCOPY Date: 07/2003 diverticulosis, int hem ([facility]). cologuard normal 2017. Latest iFOB normal 2020.  Prostate - Age out.  Lung cancer screening - never smoker  Flu shot yearly  COVID vaccine - x3 Prevnar 2017, pneumovax 2013, prevnar-20 today.   Td 2010   zostavax 2015 Shingrix - 10/2019, 12/2019 Advanced directive - received and scanned 07/2014. Samuel Matthews wife is HCPOA, does not want prolonged life support (07/2014). Ok for reversible conditions. Seat belt use discussed.  Sunscreen use discussed. No changing moles on skin. Sees derm Q39mo Samuel Matthews). H/o melanoma x3 on back as well as other skin cancers. 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 Interim medical history since our last visit reviewed. Allergies and medications reviewed and updated. Outpatient Medications Prior to Visit  Medication Sig Dispense Refill   Multiple Vitamin (MULTIVITAMIN ADULT PO) Take by mouth.     mupirocin ointment (BACTROBAN) 2 %  Apply 1 Application topically 2 (two) times daily. Bid to R knee until healed 22 g 0   tamsulosin (FLOMAX) 0.4 MG CAPS capsule Take 1 capsule (0.4 mg total) by mouth daily. 90 capsule 3   No facility-administered medications prior to visit.     Per HPI unless specifically indicated in ROS section below Review of Systems  Objective:  BP 138/70   Pulse 63   Temp 97.9 F (36.6 C) (Oral)   Ht 5' 8.5" (1.74 m)   Wt 200 lb 2 oz (90.8 kg)   SpO2 98%   BMI 29.99 kg/m   Wt Readings from Last 3 Encounters:  06/09/23 200 lb 2 oz (90.8 kg)  04/02/23 196 lb 3.2 oz (89 kg)  09/03/22 195 lb 12.8 oz (88.8 kg)      Physical Exam Vitals and nursing note reviewed.  Constitutional:      General: He is not in acute distress.     Appearance: Normal appearance. He is well-developed. He is not ill-appearing.  HENT:     Head: Normocephalic and atraumatic.     Right Ear: Hearing, tympanic membrane, ear canal and external ear normal.     Left Ear: Hearing, tympanic membrane, ear canal and external ear normal.     Mouth/Throat:     Mouth: Mucous membranes are moist.     Pharynx: Oropharynx is clear. No oropharyngeal exudate or posterior oropharyngeal erythema.  Eyes:     General: No scleral icterus.    Extraocular Movements: Extraocular movements intact.     Conjunctiva/sclera: Conjunctivae normal.     Pupils: Pupils are equal, round, and reactive to light.  Neck:     Thyroid: No thyroid mass or thyromegaly.     Vascular: No carotid bruit.  Cardiovascular:     Rate and Rhythm: Normal rate and regular rhythm.     Pulses: Normal pulses.          Radial pulses are 2+ on the right side and 2+ on the left side.     Heart sounds: Normal heart sounds. No murmur heard. Pulmonary:     Effort: Pulmonary effort is normal. No respiratory distress.     Breath sounds: Normal breath sounds. No wheezing, rhonchi or rales.  Abdominal:     General: Bowel sounds are normal. There is no distension.     Palpations: Abdomen is soft. There is no mass.     Tenderness: There is no abdominal tenderness. There is no guarding or rebound.     Hernia: No hernia is present.  Musculoskeletal:        General: Normal range of motion.     Cervical back: Normal range of motion and neck supple.     Right lower leg: No edema.     Left lower leg: No edema.  Lymphadenopathy:     Cervical: No cervical adenopathy.  Skin:    General: Skin is warm and dry.     Findings: No rash.  Neurological:     General: No focal deficit present.     Mental Status: He is alert and oriented to person, place, and time.  Psychiatric:        Mood and Affect: Mood normal.        Behavior: Behavior normal.        Thought Content: Thought content normal.         Judgment: Judgment normal.       Results for orders placed or performed in visit on 06/01/23  Basic metabolic panel   Collection Time: 06/01/23  8:52 AM  Result Value Ref Range   Sodium 140 135 - 145 mEq/L   Potassium 4.1 3.5 - 5.1 mEq/L   Chloride 106 96 - 112 mEq/L   CO2 28 19 - 32 mEq/L   Glucose, Bld 100 (H) 70 - 99 mg/dL   BUN 16 6 - 23 mg/dL   Creatinine, Ser 1.61 0.40 - 1.50 mg/dL   GFR 09.60 >45.40 mL/min   Calcium 9.5 8.4 - 10.5 mg/dL  Lipid panel   Collection Time: 06/01/23  8:52 AM  Result Value Ref Range   Cholesterol 163 0 - 200 mg/dL   Triglycerides 98.1 0.0 - 149.0 mg/dL   HDL 19.14 >78.29 mg/dL   VLDL 9.0 0.0 - 56.2 mg/dL   LDL Cholesterol 95 0 - 99 mg/dL   Total CHOL/HDL Ratio 3    NonHDL 103.61     Assessment & Plan:   Problem List Items Addressed This Visit     Advanced care planning/counseling discussion - Primary (Chronic)   Previously discussed      History of melanoma in situ   H/o melanoma x4 - regularly sees dermatology (Dr Willeen Niece)      BPH (benign prostatic hyperplasia)   H/o retention treated with flomax followed by urology. Recently stopped flomax and doing well off this.  Uro f/u left PRN.       Other Visit Diagnoses       Need for vaccination against Streptococcus pneumoniae       Relevant Orders   Pneumococcal conjugate vaccine 20-valent (Completed)        No orders of the defined types were placed in this encounter.   Orders Placed This Encounter  Procedures   Pneumococcal conjugate vaccine 20-valent    Patient Instructions  Prevnar-20 today  Good to see [facility] today Return as needed or in 1 year for next physical   Follow up plan: Return in about 1 year (around 06/08/2024) for medicare wellness visit.  Eustaquio Boyden, MD

## 2023-06-09 NOTE — Assessment & Plan Note (Signed)
 H/o retention treated with flomax followed by urology. Recently stopped flomax and doing well off this.  Uro f/u left PRN.

## 2023-08-01 ENCOUNTER — Ambulatory Visit
Admission: EM | Admit: 2023-08-01 | Discharge: 2023-08-01 | Disposition: A | Discharge status: Home / Self Care | Attending: Emergency Medicine | Admitting: Emergency Medicine

## 2023-08-01 DIAGNOSIS — Z203 Contact with and (suspected) exposure to rabies: Secondary | ICD-10-CM

## 2023-08-01 DIAGNOSIS — S61259A Open bite of unspecified finger without damage to nail, initial encounter: Secondary | ICD-10-CM | POA: Diagnosis not present

## 2023-08-01 DIAGNOSIS — Z23 Encounter for immunization: Secondary | ICD-10-CM | POA: Diagnosis not present

## 2023-08-01 DIAGNOSIS — S61230A Puncture wound without foreign body of right index finger without damage to nail, initial encounter: Secondary | ICD-10-CM | POA: Diagnosis not present

## 2023-08-01 DIAGNOSIS — W5501XA Bitten by cat, initial encounter: Secondary | ICD-10-CM

## 2023-08-01 DIAGNOSIS — S61239A Puncture wound without foreign body of unspecified finger without damage to nail, initial encounter: Secondary | ICD-10-CM

## 2023-08-01 DIAGNOSIS — L03011 Cellulitis of right finger: Secondary | ICD-10-CM

## 2023-08-01 DIAGNOSIS — Z023 Encounter for examination for recruitment to armed forces: Secondary | ICD-10-CM | POA: Diagnosis not present

## 2023-08-01 MED ORDER — RABIES VACCINE, PCEC IM SUSR
1.0000 mL | Freq: Once | INTRAMUSCULAR | Status: AC
  Administered 2023-08-01: 1 mL via INTRAMUSCULAR

## 2023-08-01 MED ORDER — TETANUS-DIPHTH-ACELL PERTUSSIS 5-2.5-18.5 LF-MCG/0.5 IM SUSY
0.5000 mL | PREFILLED_SYRINGE | Freq: Once | INTRAMUSCULAR | Status: AC
  Administered 2023-08-01: 0.5 mL via INTRAMUSCULAR

## 2023-08-01 MED ORDER — AMOXICILLIN-POT CLAVULANATE 875-125 MG PO TABS
1.0000 | ORAL_TABLET | Freq: Two times a day (BID) | ORAL | 0 refills | Status: DC
Start: 2023-08-01 — End: 2023-12-23

## 2023-08-01 MED ORDER — RABIES IMMUNE GLOBULIN 150 UNIT/ML IM INJ
20.0000 [IU]/kg | INJECTION | Freq: Once | INTRAMUSCULAR | Status: AC
  Administered 2023-08-01: 1800 [IU]

## 2023-08-01 NOTE — ED Triage Notes (Signed)
 Patient to Urgent Care with complaints of a feral cat bite to his finger that occurred yesterday.   Puncture wound to R index finger. Reports he was attempting to feed the cat.   TDAP unknown.

## 2023-08-01 NOTE — ED Provider Notes (Signed)
 Samuel Matthews    CSN: [id number] Arrival date & time: 08/01/23  1537      History   Chief Complaint Chief Complaint  Patient presents with   Animal Bite    HPI Samuel Matthews is a 83 y.o. male.  Patient presents with a puncture wound on his right index finger that occurred yesterday when he was bitten by a feral cat while feeding it.  The area is tender, red, swollen.  He cleaned the area at the time of injury.  No fever, chills, purulent drainage, numbness, weakness.  Last tetanus 2010.  The history is provided by the patient and medical records.    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 04/2020   L hip.  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   Aurther Blue, MD   TOTAL HIP ARTHROPLASTY Right 04/22/2020   Procedure: TOTAL HIP ARTHROPLASTY ANTERIOR APPROACH;  Surgeon: Liliane Rei, MD;  Location: [facility];  Service: Orthopedics;  Laterality: Right;    TRIGGER FINGER RELEASE  2001       Home Medications    Prior to Admission medications   Medication Sig Start Date End Date Taking? Authorizing Provider  amoxicillin -clavulanate (AUGMENTIN ) 875-125 MG tablet Take 1 tablet by mouth every 12 (twelve) hours. 08/01/23  Yes Wellington Half, NP  Multiple Vitamin (MULTIVITAMIN ADULT PO) Take by mouth.    [provider]    Family History Family History  Problem Relation Age of  Onset   Coronary artery disease Mother 68   Hypertension Father    CVA Father    Healthy Sister    Healthy Brother    Diabetes Neg Hx    Cancer Neg Hx     Social History Social History   Tobacco Use   Smoking status: Never    Passive exposure: Never   Smokeless tobacco: Never  Vaping Use   Vaping status: Never Used  Substance Use Topics   Alcohol use: Yes    Comment: Occasional   Drug use: No     Allergies   Lidocaine and Sulfa drugs cross reactors   Review of Systems Review of Systems  Constitutional:  Negative for chills and fever.  Musculoskeletal:  Positive for arthralgias and joint swelling.  Skin:  Positive for color change and wound.  Neurological:  Negative for weakness and numbness.     Physical Exam Triage Vital Signs ED Triage Vitals  Encounter Vitals Group     BP      Systolic BP Percentile      Diastolic BP Percentile      Pulse      Resp      Temp      Temp src      SpO2      Weight      Height      Head Circumference      Peak Flow      Pain Score      Pain Loc      Pain Education      Exclude from Growth Chart    No data found.  Updated Vital Signs BP 128/65   Pulse 98   Temp 98 F (36.7 C)   Resp 18   Wt 196 lb (88.9 kg)   SpO2 95%   BMI 29.37 kg/m   Visual Acuity Right Eye Distance:   Left Eye Distance:   Bilateral Distance:    Right Eye Near:   Left Eye Near:    Bilateral Near:     Physical Exam Constitutional:      General: He is not in acute distress. HENT:     Mouth/Throat:     Mouth: Mucous membranes are moist.  Cardiovascular:     Rate and Rhythm: Normal rate and regular rhythm.  Pulmonary:     Effort: Pulmonary effort is normal.     Breath sounds: Normal breath sounds.  Musculoskeletal:        General: Swelling and tenderness present. No deformity. Normal range of motion.  Skin:    General: Skin is warm and dry.     Capillary  Refill: Capillary refill takes less than 2 seconds.     Findings:  Erythema and lesion present.     Comments: Puncture wound on right index finger; mild erythema and edema from mid finger to tip.  No indication of felon at this time.  No bleeding or drainage.  Sensation intact, strength 5/5, FROM.   Neurological:     General: No focal deficit present.     Mental Status: He is alert.     Sensory: No sensory deficit.     Motor: No weakness.      UC Treatments / Results  Labs (all labs ordered are listed, but only abnormal results are displayed) Labs Reviewed - No data to display  EKG   Radiology No results found.  Procedures Procedures (including critical care time)  Medications Ordered in UC Medications  rabies immune globulin (HYPERRAB/KEDRAB) injection 1,800 Units (has no administration in time range)  Tdap (BOOSTRIX) injection 0.5 mL (0.5 mLs Intramuscular Given 08/01/23 1604)  rabies vaccine (RABAVERT) injection 1 mL (1 mL Intramuscular Given 08/01/23 1605)    Initial Impression / Assessment and Plan / UC Course  I have reviewed the triage vital signs and the nursing notes.  Pertinent labs & imaging results that were available during my care of the patient were reviewed by me and considered in my medical decision making (see chart for details).    Puncture wound due to cat bite of right index finger with mild cellulitis, need for postexposure rabies prophylaxis.  Afebrile and vital signs are stable.  Rabies immunoglobulin and vaccine given here.  Tetanus updated.  Treating with Augmentin .  Wound care instructions and signs of worsening infection discussed.  Instructed patient to return here for additional rabies vaccine on day 3, 7, 14.  Instructed him to return right away if he notes signs of worsening infection.  ED precautions given.  Instructed him to follow-up with his PCP tomorrow.  Education provided on animal bite, cellulitis, rabies infection, rabies immunoglobulin, rabies vaccine.  Patient agrees to plan of care.  Final Clinical  Impressions(s) / UC Diagnoses   Final diagnoses:  Puncture wound of finger of right hand, initial encounter  Cat bite of finger, initial encounter  Cellulitis of right index finger  Need for post exposure prophylaxis for rabies     Discharge Instructions      In addition to the human rabies immune globulin (HRIG), you were given the first dose of the rabies vaccine today (Day 0).  Please return here on the following dates to complete the rabies series: Day 3:08/04/2023 Day 7:08/08/2023 Day 14:08/15/2023   Your tetanus was updated today.    Take the Augmentin  as directed.    Keep your wound clean and dry.  Wash it gently twice a day with soap and water .  Follow-up right away if you show signs of infection such as pus, redness, fever.  Follow up with your primary care provider tomorrow.  Go to the emergency department if you have worsening symptoms.      ED Prescriptions     Medication Sig Dispense Auth. Provider   amoxicillin -clavulanate (AUGMENTIN ) 875-125 MG tablet Take 1 tablet by mouth every 12 (twelve) hours. 14 tablet Wellington Half, NP      PDMP not reviewed this encounter.   Wellington Half, NP 08/01/23 [phone]

## 2023-08-01 NOTE — ED Notes (Signed)
 Appointments scheduled for patient's follow-up rabies vaccines.

## 2023-08-01 NOTE — Discharge Instructions (Addendum)
 In addition to the human rabies immune globulin (HRIG), you were given the first dose of the rabies vaccine today (Day 0).  Please return here on the following dates to complete the rabies series: Day 3:08/04/2023 Day 7:08/08/2023 Day 14:08/15/2023   Your tetanus was updated today.    Take the Augmentin  as directed.    Keep your wound clean and dry.  Wash it gently twice a day with soap and water .  Follow-up right away if you show signs of infection such as pus, redness, fever.  Follow up with your primary care provider tomorrow.  Go to the emergency department if you have worsening symptoms.

## 2023-08-04 ENCOUNTER — Ambulatory Visit
Admission: EM | Admit: 2023-08-04 | Discharge: 2023-08-04 | Disposition: A | Discharge status: Home / Self Care | Attending: Emergency Medicine | Admitting: Emergency Medicine

## 2023-08-04 DIAGNOSIS — Z23 Encounter for immunization: Secondary | ICD-10-CM

## 2023-08-04 DIAGNOSIS — Z203 Contact with and (suspected) exposure to rabies: Secondary | ICD-10-CM | POA: Insufficient documentation

## 2023-08-04 MED ORDER — RABIES VACCINE, PCEC IM SUSR
1.0000 mL | Freq: Once | INTRAMUSCULAR | Status: AC
  Administered 2023-08-04: 1 mL via INTRAMUSCULAR

## 2023-08-04 NOTE — ED Triage Notes (Signed)
 Patient to Urgent Care for day 3 dose of rabies vaccine  in rabies series. Denies any other complaints.  Tolerates initial vaccines well.

## 2023-08-08 ENCOUNTER — Ambulatory Visit
Admission: EM | Admit: 2023-08-08 | Discharge: 2023-08-08 | Disposition: A | Discharge status: Home / Self Care | Attending: Emergency Medicine | Admitting: Emergency Medicine

## 2023-08-08 ENCOUNTER — Other Ambulatory Visit: Payer: Self-pay

## 2023-08-08 DIAGNOSIS — Z23 Encounter for immunization: Secondary | ICD-10-CM | POA: Diagnosis not present

## 2023-08-08 MED ORDER — RABIES VACCINE, PCEC IM SUSR: 1.0000 mL | Freq: Once | INTRAMUSCULAR | Status: DC

## 2023-08-08 NOTE — ED Triage Notes (Signed)
 PT presents today for DAY 7 Rabies vaccine .

## 2023-08-15 ENCOUNTER — Ambulatory Visit
Admission: EM | Admit: 2023-08-15 | Discharge: 2023-08-15 | Disposition: A | Discharge status: Home / Self Care | Attending: Emergency Medicine

## 2023-08-15 DIAGNOSIS — Z203 Contact with and (suspected) exposure to rabies: Secondary | ICD-10-CM | POA: Diagnosis not present

## 2023-08-15 DIAGNOSIS — Z23 Encounter for immunization: Secondary | ICD-10-CM

## 2023-08-15 MED ORDER — RABIES VACCINE, PCEC IM SUSR
1.0000 mL | Freq: Once | INTRAMUSCULAR | Status: AC
  Administered 2023-08-15: 1 mL via INTRAMUSCULAR

## 2023-08-15 NOTE — ED Triage Notes (Signed)
 Patient here for day 14 dose of Rabavert  in rabies series following feral cat bite.  Denies any other complaints. Tolerated other vaccines well.

## 2023-08-24 ENCOUNTER — Telehealth: Payer: Self-pay

## 2023-08-24 ENCOUNTER — Ambulatory Visit: Payer: Medicare Other | Admitting: Dermatology

## 2023-08-24 DIAGNOSIS — L814 Other melanin hyperpigmentation: Secondary | ICD-10-CM

## 2023-08-24 DIAGNOSIS — Z1283 Encounter for screening for malignant neoplasm of skin: Secondary | ICD-10-CM | POA: Diagnosis not present

## 2023-08-24 DIAGNOSIS — D492 Neoplasm of unspecified behavior of bone, soft tissue, and skin: Secondary | ICD-10-CM | POA: Diagnosis not present

## 2023-08-24 DIAGNOSIS — D692 Other nonthrombocytopenic purpura: Secondary | ICD-10-CM

## 2023-08-24 DIAGNOSIS — D1801 Hemangioma of skin and subcutaneous tissue: Secondary | ICD-10-CM | POA: Diagnosis not present

## 2023-08-24 DIAGNOSIS — Z85828 Personal history of other malignant neoplasm of skin: Secondary | ICD-10-CM | POA: Diagnosis not present

## 2023-08-24 DIAGNOSIS — L570 Actinic keratosis: Secondary | ICD-10-CM

## 2023-08-24 DIAGNOSIS — W908XXA Exposure to other nonionizing radiation, initial encounter: Secondary | ICD-10-CM

## 2023-08-24 DIAGNOSIS — D229 Melanocytic nevi, unspecified: Secondary | ICD-10-CM

## 2023-08-24 DIAGNOSIS — L820 Inflamed seborrheic keratosis: Secondary | ICD-10-CM | POA: Diagnosis not present

## 2023-08-24 DIAGNOSIS — L821 Other seborrheic keratosis: Secondary | ICD-10-CM

## 2023-08-24 DIAGNOSIS — C44311 Basal cell carcinoma of skin of nose: Secondary | ICD-10-CM

## 2023-08-24 DIAGNOSIS — L578 Other skin changes due to chronic exposure to nonionizing radiation: Secondary | ICD-10-CM

## 2023-08-24 DIAGNOSIS — Z86006 Personal history of melanoma in-situ: Secondary | ICD-10-CM

## 2023-08-24 NOTE — Telephone Encounter (Signed)
 Per Dr. Annette Barters: called and left voicemail for patient to return call regarding message below:  "Could you call him and see if he wants to come back in to get the ISK treated on his jaw?  I believe I forgot to do that with everything else going on.  It isn't urgent- just an age spot that gets irritated with shaving, so if he would rather wait until next visit that is fine too."

## 2023-08-24 NOTE — Patient Instructions (Addendum)
Wound Care Instructions  Cleanse wound gently with soap and water once a day then pat dry with clean gauze. Apply a thin coat of Petrolatum (petroleum jelly, "Vaseline") over the wound (unless you have an allergy to this). We recommend that you use a new, sterile tube of Vaseline. Do not pick or remove scabs. Do not remove the yellow or white "healing tissue" from the base of the wound.  Cover the wound with fresh, clean, nonstick gauze and secure with paper tape. You may use Band-Aids in place of gauze and tape if the wound is small enough, but would recommend trimming much of the tape off as there is often too much. Sometimes Band-Aids can irritate the skin.  You should call the office for your biopsy report after 1 week if you have not already been contacted.  If you experience any problems, such as abnormal amounts of bleeding, swelling, significant bruising, significant pain, or evidence of infection, please call the office immediately.  FOR ADULT SURGERY PATIENTS: If you need something for pain relief you may take 1 extra strength Tylenol (acetaminophen) AND 2 Ibuprofen (200mg  each) together every 4 hours as needed for pain. (do not take these if you are allergic to them or if you have a reason you should not take them.) Typically, you may only need pain medication for 1 to 3 days.    Cryotherapy Aftercare  Wash gently with soap and water everyday.   Apply Vaseline and Band-Aid daily until healed.    Recommend daily broad spectrum sunscreen SPF 30+ to sun-exposed areas, reapply every 2 hours as needed. Call for new or changing lesions.  Staying in the shade or wearing long sleeves, sun glasses (UVA+UVB protection) and wide brim hats (4-inch brim around the entire circumference of the hat) are also recommended for sun protection.    Melanoma ABCDEs  Melanoma is the most dangerous type of skin cancer, and is the leading cause of death from skin disease.  You are more likely to develop  melanoma if you: Have light-colored skin, light-colored eyes, or red or blond hair Spend a lot of time in the sun Tan regularly, either outdoors or in a tanning bed Have had blistering sunburns, especially during childhood Have a close family member who has had a melanoma Have atypical moles or large birthmarks  Early detection of melanoma is key since treatment is typically straightforward and cure rates are extremely high if we catch it early.   The first sign of melanoma is often a change in a mole or a new dark spot.  The ABCDE system is a way of remembering the signs of melanoma.  A for asymmetry:  The two halves do not match. B for border:  The edges of the growth are irregular. C for color:  A mixture of colors are present instead of an even brown color. D for diameter:  Melanomas are usually (but not always) greater than 6mm - the size of a pencil eraser. E for evolution:  The spot keeps changing in size, shape, and color.  Please check your skin once per month between visits. You can use a small mirror in front and a large mirror behind you to keep an eye on the back side or your body.   If you see any new or changing lesions before your next follow-up, please call to schedule a visit.  Please continue daily skin protection including broad spectrum sunscreen SPF 30+ to sun-exposed areas, reapplying every 2 hours as  needed when you're outdoors.   Staying in the shade or wearing long sleeves, sun glasses (UVA+UVB protection) and wide brim hats (4-inch brim around the entire circumference of the hat) are also recommended for sun protection.    Due to recent changes in healthcare laws, you may see results of your pathology and/or laboratory studies on MyChart before the doctors have had a chance to review them. We understand that in some cases there may be results that are confusing or concerning to you. Please understand that not all results are received at the same time and often the  doctors may need to interpret multiple results in order to provide you with the best plan of care or course of treatment. Therefore, we ask that you please give [place] 2 business days to thoroughly review all your results before contacting the office for clarification. Should we see a critical lab result, you will be contacted sooner.   If You Need Anything After Your Visit  If you have any questions or concerns for your doctor, please call our main line at [phone] and press option 4 to reach your doctor's medical assistant. If no one answers, please leave a voicemail as directed and we will return your call as soon as possible. Messages left after 4 pm will be answered the following business day.   You may also send [place] a message via MyChart. We typically respond to MyChart messages within 1-2 business days.  For prescription refills, please ask your pharmacy to contact our office. Our fax number is [phone].  If you have an urgent issue when the clinic is closed that cannot wait until the next business day, you can page your doctor at the number below.    Please note that while we do our best to be available for urgent issues outside of office hours, we are not available 24/7.   If you have an urgent issue and are unable to reach [place], you may choose to seek medical care at your doctor's office, retail clinic, urgent care center, or emergency room.  If you have a medical emergency, please immediately call 911 or go to the emergency department.  Pager Numbers  - Dr. Gwen Pounds: [phone]  - Dr. Roseanne Reno: [phone]  - Dr. Katrinka Blazing: [phone]   In the event of inclement weather, please call our main line at [phone] for an update on the status of any delays or closures.  Dermatology Medication Tips: Please keep the boxes that topical medications come in in order to help keep track of the instructions about where and how to use these. Pharmacies typically print the medication  instructions only on the boxes and not directly on the medication tubes.   If your medication is too expensive, please contact our office at [phone] option 4 or send [place] a message through MyChart.   We are unable to tell what your co-pay for medications will be in advance as this is different depending on your insurance coverage. However, we may be able to find a substitute medication at lower cost or fill out paperwork to get insurance to cover a needed medication.   If a prior authorization is required to get your medication covered by your insurance company, please allow [place] 1-2 business days to complete this process.  Drug prices often vary depending on where the prescription is filled and some pharmacies may offer cheaper prices.  The website www.goodrx.com contains coupons for medications through different pharmacies. The prices here do not account for what  the cost may be with help from insurance (it may be cheaper with your insurance), but the website can give you the price if you did not use any insurance.  - You can print the associated coupon and take it with your prescription to the pharmacy.  - You may also stop by our office during regular business hours and pick up a GoodRx coupon card.  - If you need your prescription sent electronically to a different pharmacy, notify our office through [facility] or by phone at [phone] option 4.     Si Usted Necesita Algo Despus de Su Visita  Tambin puede enviarnos un mensaje a travs de Clinical cytogeneticist. Por lo general respondemos a los mensajes de MyChart en el transcurso de 1 a 2 das hbiles.  Para renovar recetas, por favor pida a su farmacia que se ponga en contacto con nuestra oficina. Annie Sable de fax es [place] [phone].  Si tiene un asunto urgente cuando la clnica est cerrada y que no puede esperar hasta el siguiente da hbil, puede llamar/localizar a su doctor(a) al nmero que aparece a continuacin.   Por  favor, tenga en cuenta que aunque hacemos todo lo posible para estar disponibles para asuntos urgentes fuera del horario de [place], no estamos disponibles las 24 horas del da, los 7 [address]  [address] de la [place].   Si tiene un problema urgente y no puede comunicarse con nosotros, puede optar por buscar atencin mdica  en el consultorio de su doctor(a), en una clnica privada, en un centro de atencin urgente o en una sala de emergencias.  Si tiene Engineer, drilling, por favor llame inmediatamente al 911 o vaya a la sala de emergencias.  Nmeros de bper  - Dr. Gwen Pounds: [phone]  - Dra. Roseanne Reno: [phone]  - Dr. Katrinka Blazing: [phone]   En caso de inclemencias del tiempo, por favor llame a Lacy Duverney principal al [phone] para una actualizacin sobre el [place] de cualquier retraso o cierre.  Consejos para la medicacin en dermatologa: Por favor, guarde las cajas en las que vienen los medicamentos de uso tpico para ayudarle a seguir las instrucciones sobre dnde y cmo usarlos. Las farmacias generalmente imprimen las instrucciones del medicamento slo en las cajas y no directamente en los tubos del [place].   Si su medicamento es muy caro, por favor, pngase en contacto con Rolm Gala llamando al [phone] y presione la opcin 4 o envenos un mensaje a travs de Clinical cytogeneticist.   No podemos decirle cul ser su copago por los medicamentos por adelantado ya que esto es diferente dependiendo de la cobertura de su seguro. Sin embargo, es posible que podamos encontrar un medicamento sustituto a Audiological scientist un formulario para que el seguro cubra el medicamento que se considera necesario.   Si se requiere una autorizacin previa para que su compaa de seguros [place] su medicamento, por favor permtanos de 1 a 2 das hbiles para completar [address]  Los precios de los medicamentos varan con frecuencia dependiendo del Environmental consultant de dnde se surte la receta y alguna farmacias  pueden ofrecer precios ms baratos.  El sitio web www.goodrx.com tiene cupones para medicamentos de Health and safety inspector. Los precios aqu no tienen en cuenta lo que podra costar con la ayuda del seguro (puede ser ms barato con su seguro), pero el sitio web puede darle el precio si no utiliz Tourist information centre manager.  - Puede imprimir el cupn correspondiente y llevarlo con su receta a la farmacia.  - Tambin puede  pasar por nuestra oficina durante el horario de atencin regular y Education officer, museum una tarjeta de cupones de GoodRx.  - Si necesita que su receta se enve electrnicamente a una farmacia diferente, informe a nuestra oficina a travs de MyChart de  o por telfono llamando al [phone] y presione la opcin 4.

## 2023-08-24 NOTE — Telephone Encounter (Signed)
 Erroneous entry

## 2023-08-24 NOTE — Progress Notes (Signed)
 Follow-Up Visit   Subjective  Samuel Matthews is a 83 y.o. male who presents for the following: Skin Cancer Screening and Full Body Skin Exam. Patient with hx of BCC, SCC, MMIS. Place at R jaw, feels raised has been present x a while. Irritated by shaving.  The patient presents for Total-Body Skin Exam (TBSE) for skin cancer screening and mole check. The patient has spots, moles and lesions to be evaluated, some may be new or changing and the patient may have concern these could be cancer.   The following portions of the chart were reviewed this encounter and updated as appropriate: medications, allergies, medical history  Review of Systems:  No other skin or systemic complaints except as noted in HPI or Assessment and Plan.  Objective  Well appearing patient in no apparent distress; mood and affect are within normal limits.  A full examination was performed including scalp, head, eyes, ears, nose, lips, neck, chest, axillae, abdomen, back, buttocks, bilateral upper extremities, bilateral lower extremities, hands, feet, fingers, toes, fingernails, and toenails. All findings within normal limits unless otherwise noted below.   Relevant physical exam findings are noted in the Assessment and Plan.    Right anterior jaw     Left spinal lower back    R anterior jaw x1 Stuck on waxy tan papule,  photo taken today.  Scalp x7 (7) Pink scaly macules R nasal root 4mm pink papule  Left lower chest 7mm two toned tan macule     Assessment & Plan   SKIN CANCER SCREENING PERFORMED TODAY.  ACTINIC DAMAGE - Chronic condition, secondary to cumulative UV/sun exposure - diffuse scaly erythematous macules with underlying dyspigmentation - Recommend daily broad spectrum sunscreen SPF 30+ to sun-exposed areas, reapply every 2 hours as needed.  - Staying in the shade or wearing long sleeves, sun glasses (UVA+UVB protection) and wide brim hats (4-inch brim around the entire circumference of  the hat) are also recommended for sun protection.  - Call for new or changing lesions.  LENTIGINES, SEBORRHEIC KERATOSES, HEMANGIOMAS - Benign normal skin lesions - Benign-appearing - Call for any changes  SEBORRHEIC KERATOSIS - 5mm waxy, tan thin papule dark round central at left spinal lower back, photo taken today. Benign features under dermoscopy.  - 4mm medium dark brown macule C shape at R preauricular, photo taken today. Benign features under dermoscopy.  - Benign-appearing - Discussed benign etiology and prognosis. - Observe - Call for any changes  MELANOCYTIC NEVI - Tan-brown and/or pink-flesh-colored symmetric macules and papules - Benign appearing on exam today - Observation - Call clinic for new or changing moles - Recommend daily use of broad spectrum spf 30+ sunscreen to sun-exposed areas.    HISTORY OF BASAL CELL CARCINOMA OF THE SKIN Multiple see history - No evidence of recurrence today - Recommend regular full body skin exams - Recommend daily broad spectrum sunscreen SPF 30+ to sun-exposed areas, reapply every 2 hours as needed.  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  - Call if any new or changing lesions are noted between office visits  HISTORY OF MELANOMA IN SITU Multiple see history - No evidence of recurrence today - Recommend regular full body skin exams -  Recommend daily broad spectrum sunscreen SPF 30+ to sun-exposed areas, reapply every 2 hours as needed.  - Call if any new or changing lesions are noted between office visits  Purpura - Chronic; persistent and recurrent.  Treatable, but not curable. - Violaceous macules and patches - Benign - Related to trauma, age,  sun damage and/or use of blood thinners, chronic use of topical and/or oral steroids - Observe - Can use OTC arnica containing moisturizer such as Dermend Bruise Formula if desired - Call for worsening or other concerns  INFLAMED SEBORRHEIC KERATOSIS R anterior jaw x1 Symptomatic, irritated by shaving, patient would like treated.   Benign-appearing.  Observation.     Not treated today, Treat at follow up AK (ACTINIC KERATOSIS) (7) Scalp x7 (7) Actinic keratoses are precancerous spots that appear secondary to cumulative UV radiation exposure/sun exposure over time. They are chronic with expected duration over 1 year. A portion of actinic keratoses will progress to squamous cell carcinoma of the skin. It is not possible to reliably predict which spots will progress to skin cancer and so treatment is recommended to prevent development of skin cancer.  Recommend daily broad spectrum sunscreen SPF 30+ to sun-exposed areas, reapply every 2 hours as needed.  Recommend staying in the shade or wearing long sleeves, sun glasses (UVA+UVB protection) and wide brim hats (4-inch brim around the entire circumference of the hat). Call for new or changing lesions. Destruction of lesion - Scalp x7 (7)  Destruction method: cryotherapy   Informed consent: discussed and consent obtained   Lesion destroyed using liquid nitrogen: Yes   Region frozen until ice ball extended beyond lesion: Yes   Outcome: patient tolerated procedure well with no complications   Post-procedure details: wound care instructions given   Additional details:  Prior to procedure, discussed risks of blister formation, small wound, skin dyspigmentation, or rare scar following cryotherapy. Recommend Vaseline ointment to treated areas while healing.  NEOPLASM OF SKIN (2) R nasal root Skin / nail biopsy Type of biopsy: tangential   Informed consent: discussed and consent obtained   Anesthesia: the lesion was anesthetized in a standard  fashion   Anesthesia comment:  Area prepped with alcohol Anesthetic:  1% lidocaine w/ epinephrine 1-100,000 buffered w/ 8.4% NaHCO3 Instrument used: flexible razor blade   Hemostasis achieved with: pressure, aluminum chloride and electrodesiccation   Outcome: patient tolerated procedure well    Destruction of lesion  Destruction method: electrodesiccation and curettage   Informed consent: discussed and consent obtained   Curettage performed in three different directions: Yes   Electrodesiccation performed over the curetted area: Yes   Final wound size (cm):  0.5 Hemostasis achieved with:  pressure, aluminum chloride and electrodesiccation Outcome: patient tolerated procedure well with no complications   Post-procedure details: wound care instructions given   Additional details:  Mupirocin  ointment and Bandaid applied  Specimen 1 - Surgical pathology Differential Diagnosis: R/O BCC  Check Margins: No 4mm pink papule EDC today Left lower chest Epidermal / dermal shaving  Lesion diameter (cm):  1 Informed consent: discussed and consent obtained   Patient was prepped and draped in usual sterile fashion: area prepped with alcohol. Anesthesia: the lesion was anesthetized in a standard fashion   Anesthetic:  1% lidocaine w/ epinephrine 1-100,000 buffered w/ 8.4% NaHCO3 Instrument used: flexible razor blade   Hemostasis achieved with: pressure, aluminum chloride and electrodesiccation   Outcome: patient tolerated procedure well   Post-procedure details: wound care instructions given   Post-procedure details comment:  Ointment and small bandage applied Specimen 2 - Surgical pathology Differential Diagnosis: SK vs Lentigo R/o Atypia  Check Margins: No 7mm two toned tan macule  Return in 6 months (on 02/23/2024) for HxBCC, HxSCC, HxMMIS, w/ Dr. Annette Barters, TBSE.  I, Jacquelynn V. Grier Leber, CMA, am acting as scribe for Artemio Larry, MD .   Documentation: I have reviewed the above  documentation for accuracy and completeness, and I agree with the above.  Artemio Larry, MD

## 2023-08-25 ENCOUNTER — Ambulatory Visit: Admitting: Dermatology

## 2023-08-25 DIAGNOSIS — L820 Inflamed seborrheic keratosis: Secondary | ICD-10-CM

## 2023-08-25 NOTE — Telephone Encounter (Signed)
Patient advised of information. aw 

## 2023-08-25 NOTE — Progress Notes (Signed)
   Follow-Up Visit   Subjective  Samuel Matthews is a 83 y.o. male who presents for the following: check spot R cheek, long hx has gotten raised and now gets irritated with shaving. The patient has spots, moles and lesions to be evaluated, some may be new or changing and the patient may have concern these could be cancer.   The following portions of the chart were reviewed this encounter and updated as appropriate: medications, allergies, medical history  Review of Systems:  No other skin or systemic complaints except as noted in HPI or Assessment and Plan.  Objective  Well appearing patient in no apparent distress; mood and affect are within normal limits.   A focused examination was performed of the following areas: face  Relevant exam findings are noted in the Assessment and Plan.  R ant jaw x 1 Stuck on waxy tan papule with erythema  Assessment & Plan  INFLAMED SEBORRHEIC KERATOSIS R ant jaw x 1 Symptomatic, irritating, patient would like treated. Destruction of lesion - R ant jaw x 1  Destruction method: cryotherapy   Informed consent: discussed and consent obtained   Lesion destroyed using liquid nitrogen: Yes   Region frozen until ice ball extended beyond lesion: Yes   Outcome: patient tolerated procedure well with no complications   Post-procedure details: wound care instructions given   Additional details:  Prior to procedure, discussed risks of blister formation, small wound, skin dyspigmentation, or rare scar following cryotherapy. Recommend Vaseline ointment to treated areas while healing.   Return for as scheduled.  I, Rollie Clipper, RMA, am acting as scribe for Artemio Larry, MD .   Documentation: I have reviewed the above documentation for accuracy and completeness, and I agree with the above.  Artemio Larry, MD

## 2023-08-25 NOTE — Patient Instructions (Addendum)

## 2023-08-30 ENCOUNTER — Encounter: Payer: Self-pay | Admitting: Dermatology

## 2023-08-30 ENCOUNTER — Ambulatory Visit: Payer: Self-pay | Admitting: Dermatology

## 2023-08-30 NOTE — Telephone Encounter (Signed)
-----   Message from Artemio Larry sent at 08/30/2023 11:54 AM EDT ----- 1. Skin, R nasal root :       BASAL CELL CARCINOMA, NODULAR PATTERN, PERIPHERAL AND DEEP MARGINS INVOLVED  2. Skin, left lower chest :       PIGMENTED SEBORRHEIC KERATOSIS  BCC skin cancer- already treated with EDC at time of biopsy  Benign SK   - please call patient ----- Message ----- From: Interface, Lab In Three Zero One Sent: 08/27/2023   1:18 PM EDT To: Artemio Larry, MD

## 2023-12-07 ENCOUNTER — Ambulatory Visit (INDEPENDENT_AMBULATORY_CARE_PROVIDER_SITE_OTHER): Admitting: Dermatology

## 2023-12-07 DIAGNOSIS — L820 Inflamed seborrheic keratosis: Secondary | ICD-10-CM | POA: Diagnosis not present

## 2023-12-07 DIAGNOSIS — W908XXA Exposure to other nonionizing radiation, initial encounter: Secondary | ICD-10-CM | POA: Diagnosis not present

## 2023-12-07 DIAGNOSIS — L821 Other seborrheic keratosis: Secondary | ICD-10-CM | POA: Diagnosis not present

## 2023-12-07 DIAGNOSIS — L578 Other skin changes due to chronic exposure to nonionizing radiation: Secondary | ICD-10-CM

## 2023-12-07 DIAGNOSIS — Z85828 Personal history of other malignant neoplasm of skin: Secondary | ICD-10-CM | POA: Diagnosis not present

## 2023-12-07 NOTE — Patient Instructions (Addendum)
 Seborrheic Keratosis  What causes seborrheic keratoses? Seborrheic keratoses are harmless, common skin growths that first appear during adult life.  As time goes by, more growths appear.  Some people may develop a large number of them.  Seborrheic keratoses appear on both covered and uncovered body parts.  They are not caused by sunlight.  The tendency to develop seborrheic keratoses can be inherited.  They vary in color from skin-colored to gray, brown, or even black.  They can be either smooth or have a rough, warty surface.   Seborrheic keratoses are superficial and look as if they were stuck on the skin.  Under the microscope this type of keratosis looks like layers upon layers of skin.  That is why at times the top layer may seem to fall off, but the rest of the growth remains and re-grows.    Treatment Seborrheic keratoses do not need to be treated, but can easily be removed in the office.  Seborrheic keratoses often cause symptoms when they rub on clothing or jewelry.  Lesions can be in the way of shaving.  If they become inflamed, they can cause itching, soreness, or burning.  Removal of a seborrheic keratosis can be accomplished by freezing, burning, or surgery. If any spot bleeds, scabs, or grows rapidly, please return to have it checked, as these can be an indication of a skin cancer.   Cryotherapy Aftercare  Wash gently with soap and water everyday.   Apply Vaseline and Band-Aid daily until healed.     Due to recent changes in healthcare laws, you may see results of your pathology and/or laboratory studies on MyChart before the doctors have had a chance to review them. We understand that in some cases there may be results that are confusing or concerning to you. Please understand that not all results are received at the same time and often the doctors may need to interpret multiple results in order to provide you with the best plan of care or course of treatment. Therefore, we ask  that you please give us  2 business days to thoroughly review all your results before contacting the office for clarification. Should we see a critical lab result, you will be contacted sooner.   If You Need Anything After Your Visit  If you have any questions or concerns for your doctor, please call our main line at [phone] and press option 4 to reach your doctor's medical assistant. If no one answers, please leave a voicemail as directed and we will return your call as soon as possible. Messages left after 4 pm will be answered the following business day.   You may also send us  a message via MyChart. We typically respond to MyChart messages within 1-2 business days.  For prescription refills, please ask your pharmacy to contact our office. Our fax number is [phone].  If you have an urgent issue when the clinic is closed that cannot wait until the next business day, you can page your doctor at the number below.    Please note that while we do our best to be available for urgent issues outside of office hours, we are not available 24/7.   If you have an urgent issue and are unable to reach us , you may choose to seek medical care at your doctor's office, retail clinic, urgent care center, or emergency room.  If you have a medical emergency, please immediately call 911 or go to the emergency department.  Pager Numbers  - Dr. Hester:  [phone]  - Dr. Jackquline: [phone]  - Dr. Claudene: [phone]   - Dr. Raymund: [phone]  In the event of inclement weather, please call our main line at [phone] for an update on the status of any delays or closures.  Dermatology Medication Tips: Please keep the boxes that topical medications come in in order to help keep track of the instructions about where and how to use these. Pharmacies typically print the medication instructions only on the boxes and not directly on the medication tubes.   If your medication is too expensive,  please contact our office at [phone] option 4 or send us  a message through MyChart.   We are unable to tell what your co-pay for medications will be in advance as this is different depending on your insurance coverage. However, we may be able to find a substitute medication at lower cost or fill out paperwork to get insurance to cover a needed medication.   If a prior authorization is required to get your medication covered by your insurance company, please allow us  1-2 business days to complete this process.  Drug prices often vary depending on where the prescription is filled and some pharmacies may offer cheaper prices.  The website www.goodrx.com contains coupons for medications through different pharmacies. The prices here do not account for what the cost may be with help from insurance (it may be cheaper with your insurance), but the website can give you the price if you did not use any insurance.  - You can print the associated coupon and take it with your prescription to the pharmacy.  - You may also stop by our office during regular business hours and pick up a GoodRx coupon card.  - If you need your prescription sent electronically to a different pharmacy, notify our office through [facility] or by phone at [phone] option 4.     Si Usted Necesita Algo Despus de Su Visita  Tambin puede enviarnos un mensaje a travs de Clinical cytogeneticist. Por lo general respondemos a los mensajes de MyChart en el transcurso de 1 a 2 das hbiles.  Para renovar recetas, por favor pida a su farmacia que se ponga en contacto con nuestra oficina. Randi lakes de fax es [place] [phone].  Si tiene un asunto urgente cuando la clnica est cerrada y que no puede esperar hasta el siguiente da hbil, puede llamar/localizar a su doctor(a) al nmero que aparece a continuacin.   Por favor, tenga en cuenta que aunque hacemos todo lo posible para estar disponibles para asuntos urgentes fuera del  horario de [place], no estamos disponibles las 24 horas del da, los 7 [address]  [address] de la [place].   Si tiene un problema urgente y no puede comunicarse con nosotros, puede optar por buscar atencin mdica  en el consultorio de su doctor(a), en una clnica privada, en un centro de atencin urgente o en una sala de emergencias.  Si tiene Engineer, drilling, por favor llame inmediatamente al 911 o vaya a la sala de emergencias.  Nmeros de bper  - Dr. Hester: [phone]  - Dra. Jackquline: [phone]  - Dr. Claudene: [phone]  - Dra. Kitts: [phone]  En caso de inclemencias del [place], por favor llame a nuestra lnea principal al [phone] para una actualizacin sobre el estado de cualquier retraso o cierre.  Consejos para la medicacin en dermatologa: Por favor, guarde las cajas en las que vienen los medicamentos de uso tpico para ayudarle a seguir las instrucciones sobre dnde y cmo  usarlos. Las farmacias generalmente imprimen las instrucciones del medicamento slo en las cajas y no directamente en los tubos del [place].   Si su medicamento es muy caro, por favor, pngase en contacto con landry rieger llamando al [phone] y presione la opcin 4 o envenos un mensaje a travs de Clinical cytogeneticist.   No podemos decirle cul ser su copago por los medicamentos por adelantado ya que esto es diferente dependiendo de la cobertura de su seguro. Sin embargo, es posible que podamos encontrar un medicamento sustituto a Audiological scientist un formulario para que el seguro cubra el medicamento que se considera necesario.   Si se requiere una autorizacin previa para que su compaa de seguros malta su medicamento, por favor permtanos de 1 a 2 das hbiles para completar este proceso.  Los precios de los medicamentos varan con frecuencia dependiendo del Environmental consultant de dnde se surte la receta y alguna farmacias pueden ofrecer precios ms baratos.  El sitio web www.goodrx.com tiene cupones para  medicamentos de Health and safety inspector. Los precios aqu no tienen en cuenta lo que podra costar con la ayuda del seguro (puede ser ms barato con su seguro), pero el sitio web puede darle el precio si no utiliz Tourist information centre manager.  - Puede imprimir el cupn correspondiente y llevarlo con su receta a la farmacia.  - Tambin puede pasar por nuestra oficina durante el horario de atencin regular y Education officer, museum una tarjeta de cupones de GoodRx.  - Si necesita que su receta se enve electrnicamente a una farmacia diferente, informe a nuestra oficina a travs de MyChart de [place] o por telfono llamando al [phone] y presione la opcin 4.

## 2023-12-07 NOTE — Progress Notes (Signed)
   Follow-Up Visit   Subjective  Samuel Matthews is a 83 y.o. male who presents for the following: patient here today concerning a spot on under left eye noticed 6 or 7 weeks ago and a spot on left knee he noticed 8 to 10 weeks ago, a little scaly and irritated.  Also a spot at the jaw that gets irritated with shaving.  The following portions of the chart were reviewed this encounter and updated as appropriate: medications, allergies, medical history  Review of Systems:  No other skin or systemic complaints except as noted in HPI or Assessment and Plan.  Objective  Well appearing patient in no apparent distress; mood and affect are within normal limits.   A focused examination was performed of the following areas: Face and left leg   Relevant exam findings are noted in the Assessment and Plan.  Left Malar Cheek x 1, left knee x 1 , right inferior jaw x 1 (3) Erythematous stuck-on, waxy papule or plaque  Assessment & Plan    SEBORRHEIC KERATOSIS - Stuck-on, waxy, tan-brown papules and/or plaques  - Benign-appearing - Discussed benign etiology and prognosis. - Observe - Call for any changes  ACTINIC DAMAGE - chronic, secondary to cumulative UV radiation exposure/sun exposure over time - diffuse scaly erythematous macules with underlying dyspigmentation - Recommend daily broad spectrum sunscreen SPF 30+ to sun-exposed areas, reapply every 2 hours as needed.  - Recommend staying in the shade or wearing long sleeves, sun glasses (UVA+UVB protection) and wide brim hats (4-inch brim around the entire circumference of the hat). - Call for new or changing lesions.  INFLAMED SEBORRHEIC KERATOSIS (3) Left Malar Cheek x 1, left knee x 1 , right inferior jaw x 1 (3) Symptomatic, irritating, patient would like treated.  Vs wart at left knee Destruction of lesion - Left Malar Cheek x 1, left knee x 1 , right inferior jaw x 1 (3) Complexity: simple   Destruction method: cryotherapy    Informed consent: discussed and consent obtained   Timeout:  patient name, date of birth, surgical site, and procedure verified Lesion destroyed using liquid nitrogen: Yes   Region frozen until ice ball extended beyond lesion: Yes   Outcome: patient tolerated procedure well with no complications   Post-procedure details: wound care instructions given     HISTORY OF BASAL CELL CARCINOMA OF THE SKIN - No evidence of recurrence today - Recommend regular full body skin exams - Recommend daily broad spectrum sunscreen SPF 30+ to sun-exposed areas, reapply every 2 hours as needed.  - Call if any new or changing lesions are noted between office visits   Return for keep follow up as scheduled 02/28/2024.  I, Eleanor Blush, CMA, am acting as scribe for Rexene Rattler, MD.   Documentation: I have reviewed the above documentation for accuracy and completeness, and I agree with the above.  Rexene Rattler, MD

## 2023-12-23 ENCOUNTER — Ambulatory Visit (INDEPENDENT_AMBULATORY_CARE_PROVIDER_SITE_OTHER): Admitting: Physician Assistant

## 2023-12-23 ENCOUNTER — Encounter: Payer: Self-pay | Admitting: Physician Assistant

## 2023-12-23 VITALS — BP 124/75 | HR 73 | Ht 70.0 in | Wt 193.0 lb

## 2023-12-23 DIAGNOSIS — D239 Other benign neoplasm of skin, unspecified: Secondary | ICD-10-CM

## 2023-12-23 DIAGNOSIS — R3129 Other microscopic hematuria: Secondary | ICD-10-CM

## 2023-12-23 DIAGNOSIS — R310 Gross hematuria: Secondary | ICD-10-CM | POA: Diagnosis not present

## 2023-12-23 DIAGNOSIS — N138 Other obstructive and reflux uropathy: Secondary | ICD-10-CM | POA: Diagnosis not present

## 2023-12-23 DIAGNOSIS — N401 Enlarged prostate with lower urinary tract symptoms: Secondary | ICD-10-CM | POA: Diagnosis not present

## 2023-12-23 LAB — MICROSCOPIC EXAMINATION: RBC, Urine: >30 /HPF — AB (ref 0–2)

## 2023-12-23 LAB — URINALYSIS, COMPLETE
Bilirubin, UA: NEGATIVE
Glucose, UA: NEGATIVE
Ketones, UA: NEGATIVE
Leukocytes,UA: NEGATIVE
Nitrite, UA: NEGATIVE
Protein,UA: NEGATIVE
Specific Gravity, UA: 1.025 (ref 1.005–1.030)
Urobilinogen, Ur: 0.2 mg/dL (ref 0.2–1.0)
pH, UA: 6 (ref 5.0–7.5)

## 2023-12-23 LAB — BLADDER SCAN AMB NON-IMAGING

## 2023-12-23 NOTE — Patient Instructions (Addendum)
 CT scan scheduling: [phone]  Cystoscopy Cystoscopy is a procedure that is used to help diagnose and sometimes treat conditions that affect the lower urinary tract. The lower urinary tract includes the bladder and the urethra. The urethra is the tube that drains urine from the bladder. Cystoscopy is done using a thin, tube-shaped instrument with a light and camera at the end (cystoscope). The cystoscope may be hard or flexible, depending on the goal of the procedure. The cystoscope is inserted through the urethra, into the bladder. Cystoscopy may be recommended if you have: Urinary tract infections that keep coming back. Blood in the urine (hematuria). An inability to control when you urinate (urinary incontinence) or an overactive bladder. Unusual cells found in a urine sample. A blockage in the urethra, such as a urinary stone. Painful urination. An abnormality in the bladder found during an intravenous pyelogram (IVP) or CT scan. What are the risks? Generally, this is a safe procedure. However, problems may occur, including: Infection. Bleeding.  What happens during the procedure?  You will be given one or more of the following: A medicine to numb the area (local anesthetic). The area around the opening of your urethra will be cleaned. The cystoscope will be passed through your urethra into your bladder. Germ-free (sterile) fluid will flow through the cystoscope to fill your bladder. The fluid will stretch your bladder so that your health care provider can clearly examine your bladder walls. Your doctor will look at the urethra and bladder. The cystoscope will be removed The procedure may vary among health care providers  What can I expect after the procedure? After the procedure, it is common to have: Some soreness or pain in your urethra. Urinary symptoms. These include: Mild pain or burning when you urinate. Pain should stop within a few minutes after you urinate. This may  last for up to a few days after the procedure. A small amount of blood in your urine for several days. Feeling like you need to urinate but producing only a small amount of urine. Follow these instructions at home: General instructions Return to your normal activities as told by your health care provider.  Drink plenty of fluids after the procedure. Keep all follow-up visits as told by your health care provider. This is important. Contact a health care provider if you: Have pain that gets worse or does not get better with medicine, especially pain when you urinate lasting longer than 72 hours after the procedure. Have trouble urinating. Get help right away if you: Have blood clots in your urine. Have a fever or chills. Are unable to urinate. Summary Cystoscopy is a procedure that is used to help diagnose and sometimes treat conditions that affect the lower urinary tract. Cystoscopy is done using a thin, tube-shaped instrument with a light and camera at the end. After the procedure, it is common to have some soreness or pain in your urethra. It is normal to have blood in your urine after the procedure.  If you were prescribed an antibiotic medicine, take it as told by your health care provider.  This information is not intended to replace advice given to you by your health care provider. Make sure you discuss any questions you have with your health care provider. Document Revised: 02/22/2018 Document Reviewed: 02/22/2018 Elsevier Patient Education  2020 [employer].

## 2023-12-23 NOTE — Progress Notes (Signed)
 12/23/2023 2:10 PM   Samuel Matthews Jul 17, 1940 [id number]  CC: Chief Complaint  Patient presents with   Hematuria   HPI: Samuel Matthews is a 83 y.o. male with PMH BPH with urinary retention previously on Flomax  who presents today for evaluation of gross hematuria.  He is accompanied today by his wife, Samuel Matthews, who contributes to HPI.  Today he reports he has been off Flomax  for about a year and is doing well.  8 days ago, he had to subsequent days of bleeding from the groin after taking a shower.  He thinks that the blood came from his right scrotum, but is not certain if this.  He denies any pain associated with it.  He reports a chronic history of microscopic hematuria with remote workup several decades ago.  He is a never smoker with no known industrial exposures.  His wife, who previously worked in lithotripsy, wonders if he may have passed a stone because she recalls he recently had some back pain.  In-office UA today positive for 3+ blood; urine microscopy with >30 RBCs/HPF and moderate bacteria. PVR 36mL.  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   L nasal dorsum inferior   Basal cell carcinoma 11/25/2015   L postauricular area   Basal cell carcinoma 05/06/2015   R chest, L neck   Basal cell carcinoma 10/30/2014   L ant shoulder   Basal cell carcinoma 02/06/2020   L med posterior shoulder, [facility]   Basal cell carcinoma 08/24/2023   R nasal root, [facility]   Basal cell carcinoma of nose 2015   ([facility])   Cancer [facility]) 2013   Hx: of squamous cell left calf   Cataract 2017   Surgery   Complication of anesthesia    DR Jackquline removed melanoma used lidocaine next day lightheaded, blood pressure low- ER- adm for 3 days due to hypotensiion - ? 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 04/2020   L hip.  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   Samuel LULLA Moan, MD   TOTAL HIP ARTHROPLASTY Right 04/22/2020   Procedure: TOTAL HIP ARTHROPLASTY ANTERIOR APPROACH;  Surgeon: Matthews Dempsey, MD;  Location: [facility];  Service: Orthopedics;  Laterality: Right;    TRIGGER FINGER RELEASE  2001    Home Medications:  Allergies as of 12/23/2023       Reactions   Lidocaine Other (See Comments)   Dropped blood pressures severely   Sulfa Drugs Cross Reactors Hives        Medication List        Accurate as of December 23, 2023  2:10 PM. If you have any questions, ask your nurse or doctor.          STOP taking these medications    amoxicillin -clavulanate 875-125 MG tablet Commonly known as: AUGMENTIN  Stopped by: Lucie Hones       TAKE these medications    MULTIVITAMIN ADULT PO Take by mouth.         Allergies:  Allergies  Allergen Reactions   Lidocaine Other (See Comments)    Dropped blood pressures severely   Sulfa Drugs Cross Reactors Hives    Family History: Family History  Problem Relation Age of Onset   Coronary artery disease Mother 38   Hypertension Father    CVA Father    Healthy Sister    Healthy Brother    Diabetes Neg Hx    Cancer Neg Hx     Social History:   reports that he has never smoked. He has never been exposed to tobacco smoke. He has never used smokeless tobacco. He reports current alcohol use. He reports that he does not use drugs.  Physical Exam: BP 124/75   Pulse 73   Ht 5' 10 (1.778 m)   Wt 193 lb (87.5 kg)   BMI 27.69 kg/m   Constitutional:  Alert and oriented, no acute distress, nontoxic appearing HEENT: [place], AT Cardiovascular: No clubbing, cyanosis, or edema Respiratory: Normal respiratory effort, no increased work of breathing GU: Bilateral descended testicles.  Patent urethral meatus.  No active bleeding from the scrotum, inguinal creases, or meatus.  I thoroughly inspected his scrotum and perineum and noticed no skin breaks bilaterally.  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 Skin: No rashes, bruises or suspicious lesions Neurologic: Grossly intact, no focal deficits, moving all 4 extremities Psychiatric: Normal mood and affect  Laboratory Data: Results for orders placed or performed in visit on 12/23/23  Microscopic Examination   Collection Time: 12/23/23  1:50 PM   Urine  Result Value Ref Range   WBC, UA 0-5 0 - 5 /hpf   RBC, Urine >30 (A) 0 - 2 /hpf   Epithelial Cells (non renal) 0-10 0 - 10 /hpf   Mucus, UA Present (A) Not Estab.   Bacteria, UA Moderate (A) None seen/Few  Urinalysis, Complete   Collection Time: 12/23/23  1:50 PM  Result Value Ref Range   Specific Gravity, UA 1.025 1.005 - 1.030   pH, UA 6.0 5.0 - 7.5   Color, UA  Yellow Yellow   Appearance Ur Clear Clear   Leukocytes,UA Negative Negative   Protein,UA Negative Negative/Trace   Glucose, UA Negative Negative   Ketones, UA Negative Negative   RBC, UA 3+ (A) Negative  Bilirubin, UA Negative Negative   Urobilinogen, Ur 0.2 0.2 - 1.0 mg/dL   Nitrite, UA Negative Negative   Microscopic Examination See below:   BLADDER SCAN AMB NON-IMAGING   Collection Time: 12/23/23  1:58 PM  Result Value Ref Range   Scan Result 36ml    Assessment & Plan:   1. Microscopic hematuria (Primary) Unclear etiology of his recent bleeding, suspect bleeding angiokeratoma versus urethral bleeding.  He did have notable microscopic hematuria today, which I think warrants further evaluation.  I explained that blood in the urine can be caused by a myriad of factors, including but not limited to infection, stones, cysts, anticoagulation, and urinary tract malignancies.  I explained that the recommended work-up for blood in the urine is twofold and includes a CT urogram for evaluation of the upper urinary tract including kidneys and ureters as well as a cystoscopy for evaluation of the urethra and bladder.  I explained that these two studies complement one another in reviewing the entire urinary tract for possible causes of bleeding.  I recommended that we proceed with this at this time.  Patient agreed; CTU ordered and follow-up cysto with CTU results scheduled.  - BLADDER SCAN AMB NON-IMAGING - Urinalysis, Complete - CULTURE, URINE COMPREHENSIVE  2. Angiokeratoma See above.  No active bleeding noted.  We discussed that these can bleed if they get irritated.  Return in about 4 weeks (around 01/20/2024) for Cysto and CTU results with Dr. Francisca.  Lucie Hones, PA-C  [facility] [place] [address], [address] [place], [place] [address] [phone]

## 2023-12-27 NOTE — Addendum Note (Signed)
 Addended by: Felesia Stahlecker A on: 12/27/2023 02:58 PM   Modules accepted: Orders

## 2023-12-30 ENCOUNTER — Ambulatory Visit: Payer: Self-pay | Admitting: Physician Assistant

## 2023-12-30 LAB — CULTURE, URINE COMPREHENSIVE

## 2024-01-18 ENCOUNTER — Ambulatory Visit
Admission: RE | Admit: 2024-01-18 | Discharge: 2024-01-18 | Disposition: A | Discharge status: Home / Self Care | Source: Ambulatory Visit | Attending: Physician Assistant | Admitting: Physician Assistant

## 2024-01-18 DIAGNOSIS — R319 Hematuria, unspecified: Secondary | ICD-10-CM | POA: Diagnosis not present

## 2024-01-18 DIAGNOSIS — N289 Disorder of kidney and ureter, unspecified: Secondary | ICD-10-CM | POA: Diagnosis not present

## 2024-01-18 DIAGNOSIS — R3129 Other microscopic hematuria: Secondary | ICD-10-CM | POA: Insufficient documentation

## 2024-01-18 DIAGNOSIS — K5730 Diverticulosis of large intestine without perforation or abscess without bleeding: Secondary | ICD-10-CM | POA: Diagnosis not present

## 2024-01-18 MED ORDER — IOHEXOL 300 MG/ML  SOLN
125.0000 mL | Freq: Once | INTRAMUSCULAR | Status: AC | PRN
  Administered 2024-01-18: 125 mL via INTRAVENOUS

## 2024-02-02 ENCOUNTER — Ambulatory Visit (INDEPENDENT_AMBULATORY_CARE_PROVIDER_SITE_OTHER): Admitting: Urology

## 2024-02-02 VITALS — BP 119/70 | HR 87

## 2024-02-02 DIAGNOSIS — R3129 Other microscopic hematuria: Secondary | ICD-10-CM

## 2024-02-02 MED ORDER — CEPHALEXIN 500 MG PO CAPS
500.0000 mg | ORAL_CAPSULE | Freq: Once | ORAL | Status: AC
  Administered 2024-02-02: 500 mg via ORAL

## 2024-02-02 MED ORDER — TAMSULOSIN HCL 0.4 MG PO CAPS: 0.4000 mg | ORAL_CAPSULE | Freq: Every day | ORAL | 11 refills | Status: AC

## 2024-02-02 MED ORDER — LIDOCAINE HCL URETHRAL/MUCOSAL 2 % EX GEL
1.0000 | Freq: Once | CUTANEOUS | Status: AC
  Administered 2024-02-02: 1 via URETHRAL

## 2024-02-02 NOTE — Progress Notes (Signed)
 Cystoscopy Procedure Note:  Indication: Microscopic hematuria, history of retention  Keflex  given for prophylaxis  After informed consent and discussion of the procedure and its risks, Samuel Matthews was positioned and prepped in the standard fashion. Cystoscopy was performed with a flexible cystoscope. The urethra, bladder neck and entire bladder was visualized in a standard fashion. The prostate was moderate in size. The ureteral orifices were visualized in their normal location and orientation.  Moderate trabeculations, no suspicious bladder lesions, no abnormalities on retroflexion  Imaging: CT urogram November 2025 with no urologic abnormalities  Findings: Normal cystoscopy  Assessment and Plan: Reassurance provided regarding negative microscopic hematuria workup, no urinary symptoms and has been off Flomax .  He did request a Flomax  refill because he is traveling out of state just in case he has recurrence of his urinary symptoms.  Return precautions were discussed, he prefers follow-up with urology as needed  Samuel Burnet, MD 02/02/2024

## 2024-02-28 ENCOUNTER — Ambulatory Visit: Admitting: Dermatology

## 2024-02-28 DIAGNOSIS — L918 Other hypertrophic disorders of the skin: Secondary | ICD-10-CM | POA: Diagnosis not present

## 2024-02-28 DIAGNOSIS — Z1283 Encounter for screening for malignant neoplasm of skin: Secondary | ICD-10-CM

## 2024-02-28 DIAGNOSIS — D1801 Hemangioma of skin and subcutaneous tissue: Secondary | ICD-10-CM | POA: Diagnosis not present

## 2024-02-28 DIAGNOSIS — Z7189 Other specified counseling: Secondary | ICD-10-CM | POA: Diagnosis not present

## 2024-02-28 DIAGNOSIS — W908XXA Exposure to other nonionizing radiation, initial encounter: Secondary | ICD-10-CM | POA: Diagnosis not present

## 2024-02-28 DIAGNOSIS — L814 Other melanin hyperpigmentation: Secondary | ICD-10-CM | POA: Diagnosis not present

## 2024-02-28 DIAGNOSIS — L570 Actinic keratosis: Secondary | ICD-10-CM

## 2024-02-28 DIAGNOSIS — Z85828 Personal history of other malignant neoplasm of skin: Secondary | ICD-10-CM | POA: Diagnosis not present

## 2024-02-28 DIAGNOSIS — Z86006 Personal history of melanoma in-situ: Secondary | ICD-10-CM

## 2024-02-28 DIAGNOSIS — L578 Other skin changes due to chronic exposure to nonionizing radiation: Secondary | ICD-10-CM | POA: Diagnosis not present

## 2024-02-28 DIAGNOSIS — L821 Other seborrheic keratosis: Secondary | ICD-10-CM | POA: Diagnosis not present

## 2024-02-28 DIAGNOSIS — D229 Melanocytic nevi, unspecified: Secondary | ICD-10-CM

## 2024-02-28 NOTE — Patient Instructions (Addendum)

## 2024-02-28 NOTE — Progress Notes (Signed)
 Follow-Up Visit   Subjective  Samuel Matthews is a 83 y.o. male who presents for the following: Skin Cancer Screening and Full Body Skin Exam  The patient presents for Total-Body Skin Exam (TBSE) for skin cancer screening and mole check. The patient has spots, moles and lesions to be evaluated, some may be new or changing and the patient may have concern these could be cancer. Hx of BCC R nasal root.    The following portions of the chart were reviewed this encounter and updated as appropriate: medications, allergies, medical history  Review of Systems:  No other skin or systemic complaints except as noted in HPI or Assessment and Plan.  Objective  Well appearing patient in no apparent distress; mood and affect are within normal limits.  A full examination was performed including scalp, head, eyes, ears, nose, lips, neck, chest, axillae, abdomen, back, buttocks, bilateral upper extremities, bilateral lower extremities, hands, feet, fingers, toes, fingernails, and toenails. All findings within normal limits unless otherwise noted below.   Relevant physical exam findings are noted in the Assessment and Plan.  Right Temple x 1 Pink scaly macule  frontal scalp x 1 Keratotic papule.  Assessment & Plan   SKIN CANCER SCREENING PERFORMED TODAY.  ACTINIC DAMAGE WITH PRECANCEROUS ACTINIC KERATOSES Counseling for Topical Chemotherapy Management: Patient exhibits: - Severe, confluent actinic changes with pre-cancerous actinic keratoses that is secondary to cumulative UV radiation exposure over time - Condition that is severe; chronic, not at goal. - diffuse scaly erythematous macules and papules with underlying dyspigmentation - Discussed Prescription Field Treatment topical Chemotherapy for Severe, Chronic Confluent Actinic Changes with Pre-Cancerous Actinic Keratoses Field treatment involves treatment of an entire area of skin that has confluent Actinic Changes (Sun/ Ultraviolet light  damage) and PreCancerous Actinic Keratoses by method of PhotoDynamic Therapy (PDT) and/or prescription Topical Chemotherapy agents such as 5-fluorouracil, 5-fluorouracil/calcipotriene, and/or imiquimod.  The purpose is to decrease the number of clinically evident and subclinical PreCancerous lesions to prevent progression to development of skin cancer by chemically destroying early precancer changes that may or may not be visible.  It has been shown to reduce the risk of developing skin cancer in the treated area. As a result of treatment, redness, scaling, crusting, and open sores may occur during treatment course. One or more than one of these methods may be used and may have to be used several times to control, suppress and eliminate the PreCancerous changes. Discussed treatment course, expected reaction, and possible side effects. - Recommend daily broad spectrum sunscreen SPF 30+ to sun-exposed areas, reapply every 2 hours as needed.  - Staying in the shade or wearing long sleeves, sun glasses (UVA+UVB protection) and wide brim hats (4-inch brim around the entire circumference of the hat) are also recommended. - Call for new or changing lesions. - Recommend red light PDT with debridement to face.  Patient will schedule.   LENTIGINES, SEBORRHEIC KERATOSES, HEMANGIOMAS - Benign normal skin lesions - Benign-appearing - Call for any changes  MELANOCYTIC NEVI - Tan-brown and/or pink-flesh-colored symmetric macules and papules - Benign appearing on exam today - Observation - Call clinic for new or changing moles - Recommend daily use of broad spectrum spf 30+ sunscreen to sun-exposed areas.   SEBORRHEIC KERATOSIS - 5mm waxy, tan thin papule darker central at left spinal lower back, photo compared, no changes. Benign features under dermoscopy.  - 4mm medium dark brown macule C shape at R preauricular, photo compared, no changes. Benign features under dermoscopy.  -  Benign-appearing - Discussed  benign etiology and prognosis. - Observe - Call for any changes  HISTORY OF BASAL CELL CARCINOMA OF THE SKIN Multiple see history - No evidence of recurrence today, including R nasal root - Recommend regular full body skin exams - Recommend daily broad spectrum sunscreen SPF 30+ to sun-exposed areas, reapply every 2 hours as needed.  - Call if any new or changing lesions are noted between office visits   HISTORY OF SQUAMOUS CELL CARCINOMA OF THE SKIN Multiple see history - No evidence of recurrence today - Recommend regular full body skin exams - Recommend daily broad spectrum sunscreen SPF 30+ to sun-exposed areas, reapply every 2 hours as needed.  - Call if any new or changing lesions are noted between office visits   HISTORY OF MELANOMA IN SITU Multiple see history - No evidence of recurrence today - Recommend regular full body skin exams - Recommend daily broad spectrum sunscreen SPF 30+ to sun-exposed areas, reapply every 2 hours as needed.  - Call if any new or changing lesions are noted between office visits  Acrochordons (Skin Tags) - Fleshy, skin-colored pedunculated papules - Benign appearing.  - Observe. - If desired, they can be removed with an in office procedure that is not covered by insurance. - Please call the clinic if you notice any new or changing lesions.   AK (ACTINIC KERATOSIS) Right Temple x 1 Actinic keratoses are precancerous spots that appear secondary to cumulative UV radiation exposure/sun exposure over time. They are chronic with expected duration over 1 year. A portion of actinic keratoses will progress to squamous cell carcinoma of the skin. It is not possible to reliably predict which spots will progress to skin cancer and so treatment is recommended to prevent development of skin cancer.  Recommend daily broad spectrum sunscreen SPF 30+ to sun-exposed areas, reapply every 2 hours as needed.  Recommend staying in the shade or wearing long  sleeves, sun glasses (UVA+UVB protection) and wide brim hats (4-inch brim around the entire circumference of the hat). Call for new or changing lesions. - Destruction of lesion - Right Temple x 1  Destruction method: cryotherapy   Informed consent: discussed and consent obtained   Lesion destroyed using liquid nitrogen: Yes   Region frozen until ice ball extended beyond lesion: Yes   Outcome: patient tolerated procedure well with no complications   Post-procedure details: wound care instructions given   Additional details:  Prior to procedure, discussed risks of blister formation, small wound, skin dyspigmentation, or rare scar following cryotherapy. Recommend Vaseline ointment to treated areas while healing.   HYPERTROPHIC ACTINIC KERATOSIS frontal scalp x 1 Actinic keratoses are precancerous spots that appear secondary to cumulative UV radiation exposure/sun exposure over time. They are chronic with expected duration over 1 year. A portion of actinic keratoses will progress to squamous cell carcinoma of the skin. It is not possible to reliably predict which spots will progress to skin cancer and so treatment is recommended to prevent development of skin cancer.  Recommend daily broad spectrum sunscreen SPF 30+ to sun-exposed areas, reapply every 2 hours as needed.  Recommend staying in the shade or wearing long sleeves, sun glasses (UVA+UVB protection) and wide brim hats (4-inch brim around the entire circumference of the hat). Call for new or changing lesions. - Destruction of lesion - frontal scalp x 1  Destruction method: cryotherapy   Informed consent: discussed and consent obtained   Lesion destroyed using liquid nitrogen: Yes   Region frozen  until ice ball extended beyond lesion: Yes   Outcome: patient tolerated procedure well with no complications   Post-procedure details: wound care instructions given   Additional details:  Prior to procedure, discussed risks of blister  formation, small wound, skin dyspigmentation, or rare scar following cryotherapy. Recommend Vaseline ointment to treated areas while healing.   Return for Red light w/debridement to face; 6 mo TBSE Hx MMIS.  IAndrea Kerns, CMA, am acting as scribe for Rexene Rattler, MD .   Documentation: I have reviewed the above documentation for accuracy and completeness, and I agree with the above.  Rexene Rattler, MD

## 2024-04-04 ENCOUNTER — Ambulatory Visit: Payer: Medicare Other

## 2024-04-04 VITALS — BP 122/68 | Ht 70.0 in | Wt 199.2 lb

## 2024-04-04 DIAGNOSIS — Z0000 Encounter for general adult medical examination without abnormal findings: Secondary | ICD-10-CM | POA: Diagnosis not present

## 2024-04-04 NOTE — Patient Instructions (Signed)
 Mr. Fawbush,  Thank you for taking the time for your Medicare Wellness Visit. I appreciate your continued commitment to your health goals. Please review the care plan we discussed, and feel free to reach out if I can assist you further.  Please note that Annual Wellness Visits do not include a physical exam. Some assessments may be limited, especially if the visit was conducted virtually. If needed, we may recommend an in-person follow-up with your provider.  Ongoing Care Seeing your primary care provider every 3 to 6 months helps us  monitor your health and provide consistent, personalized care.   Referrals If a referral was made during today's visit and you haven't received any updates within two weeks, please contact the referred provider directly to check on the status.  Recommended Screenings:  Health Maintenance  Topic Date Due   Flu Shot  10/15/2023   COVID-19 Vaccine (4 - 2025-26 season) 11/15/2023   Medicare Annual Wellness Visit  04/01/2024   DTaP/Tdap/Td vaccine (3 - Td or Tdap) 07/31/2033   Pneumococcal Vaccine for age over 47  Completed   Zoster (Shingles) Vaccine  Completed   Meningitis B Vaccine  Aged Out       03/31/2024   10:21 AM  Advanced Directives  Does Patient Have a Medical Advance Directive? Yes  Type of Advance Directive Living will;Healthcare Power of Attorney  Does patient want to make changes to medical advance directive? No - Patient declined  Copy of Healthcare Power of Attorney in Chart? No - copy requested    Vision: Annual vision screenings are recommended for early detection of glaucoma, cataracts, and diabetic retinopathy. These exams can also reveal signs of chronic conditions such as diabetes and high blood pressure.  Dental: Annual dental screenings help detect early signs of oral cancer, gum disease, and other conditions linked to overall health, including heart disease and diabetes.  Please see the attached documents for additional preventive  care recommendations.

## 2024-04-04 NOTE — Progress Notes (Signed)
 "  Chief Complaint  Patient presents with   Medicare Wellness     Subjective:   Samuel Matthews is a 84 y.o. male who presents for a Medicare Annual Wellness Visit.  Visit info / Clinical Intake: Medicare Wellness Visit Type:: Subsequent Annual Wellness Visit Persons participating in visit and providing information:: patient Medicare Wellness Visit Mode:: In-person (required for WTM) Interpreter Needed?: No Pre-visit prep was completed: yes AWV questionnaire completed by patient prior to visit?: yes Date:: 03/31/24 Living arrangements:: (Patient-Rptd) lives with spouse/significant other Patient's Overall Health Status Rating: (Patient-Rptd) excellent Typical amount of pain: (Patient-Rptd) none Does pain affect daily life?: (Patient-Rptd) no Are you currently prescribed opioids?: no  Dietary Habits and Nutritional Risks How many meals a day?: (Patient-Rptd) 3 Eats fruit and vegetables daily?: (Patient-Rptd) yes Most meals are obtained by: (Patient-Rptd) preparing own meals In the last 2 weeks, have you had any of the following?: none Diabetic:: no  Functional Status Activities of Daily Living (to include ambulation/medication): (Patient-Rptd) Independent Ambulation: (Patient-Rptd) Independent Medication Administration: (Patient-Rptd) Independent Home Management (perform basic housework or laundry): (Patient-Rptd) Independent Manage your own finances?: (Patient-Rptd) yes Primary transportation is: (Patient-Rptd) driving Concerns about vision?: no *vision screening is required for WTM* Concerns about hearing?: no  Fall Screening Falls in the past year?: (Patient-Rptd) 0 Number of falls in past year: 0 Was there an injury with Fall?: 0 Fall Risk Category Calculator: 0 Patient Fall Risk Level: Low Fall Risk  Fall Risk Patient at Risk for Falls Due to: No Fall Risks Fall risk Follow up: Falls evaluation completed; Education provided; Falls prevention discussed  Home and  Transportation Safety: All rugs have non-skid backing?: (!) (Patient-Rptd) no All stairs or steps have railings?: (Patient-Rptd) yes Grab bars in the bathtub or shower?: (!) (Patient-Rptd) no Have non-skid surface in bathtub or shower?: (!) (Patient-Rptd) no Good home lighting?: (Patient-Rptd) yes Regular seat belt use?: (Patient-Rptd) yes Hospital stays in the last year:: (Patient-Rptd) no  Cognitive Assessment Difficulty concentrating, remembering, or making decisions? : (Patient-Rptd) no Will 6CIT or Mini Cog be Completed: yes What year is it?: 0 points What month is it?: 0 points Give patient an address phrase to remember (5 components): [address] [place]  About what time is it?: 0 points Count backwards from 20 to 1: 0 points Say the months of the year in reverse: 0 points Repeat the address phrase from earlier: 0 points 6 CIT Score: 0 points  Advance Directives (For Healthcare) Does Patient Have a Medical Advance Directive?: Yes Does patient want to make changes to medical advance directive?: No - Patient declined Type of Advance Directive: Living will; Healthcare Power of Attorney Copy of Healthcare Power of Attorney in Chart?: No - copy requested Copy of Living Will in Chart?: No - copy requested  Reviewed/Updated  Reviewed/Updated: Reviewed All (Medical, Surgical, Family, Medications, Allergies, Care Teams, Patient Goals)    Allergies (verified) Lidocaine  and Sulfa drugs cross reactors   Current Medications (verified) Outpatient Encounter Medications as of 04/04/2024  Medication Sig   Multiple Vitamin (MULTIVITAMIN ADULT PO) Take by mouth.   tamsulosin  (FLOMAX ) 0.4 MG CAPS capsule Take 1 capsule (0.4 mg total) by mouth daily. (Patient not taking: Reported on 04/04/2024)   No facility-administered encounter medications on file as of 04/04/2024.    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   L nasal dorsum inferior  Basal cell carcinoma 11/25/2015   L postauricular area   Basal cell carcinoma 05/06/2015   R chest, L neck   Basal cell carcinoma 10/30/2014   L ant shoulder   Basal cell carcinoma 02/06/2020   L med posterior shoulder, [facility]   Basal cell carcinoma 08/24/2023   R nasal root, [facility]   Basal cell carcinoma of nose 2015   ([facility])   Cancer [facility]) 2013   Hx: of squamous cell left calf   Cataract 2017   Surgery   Complication of anesthesia    DR Jackquline removed melanoma used lidocaine  next day lightheaded, blood pressure low- ER- adm for 3 days due to hypotensiion - ? 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 04/2020   L hip.                    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   Dempsey LULLA Moan, MD    TOTAL HIP ARTHROPLASTY Right 04/22/2020   Procedure: TOTAL HIP ARTHROPLASTY ANTERIOR APPROACH;  Surgeon: Moan Dempsey, MD;  Location: [facility];  Service: Orthopedics;  Laterality: Right;    TRIGGER FINGER RELEASE  2001   Family History  Problem Relation Age of Onset   Coronary artery disease Mother 41   Hypertension Father    CVA Father    Cancer Father    Stroke Father    Healthy Sister    Healthy Brother    Diabetes Neg Hx    Social History   Occupational History   Occupation: Retired  Tobacco Use   Smoking status: Never    Passive exposure: Never   Smokeless tobacco: Never  Vaping Use   Vaping status: Never Used  Substance and Sexual Activity   Alcohol use: Yes    Alcohol/week: 4.0 standard drinks of alcohol    Types: 4 Cans of beer per week    Comment: Occasional   Drug use: Never   Sexual activity: Not Currently   Tobacco Counseling Counseling given: Not Answered  SDOH Screenings   Food Insecurity: No Food  Insecurity (04/04/2024)  Housing: Low Risk (04/04/2024)  Transportation Needs: No Transportation Needs (04/04/2024)  Utilities: Not At Risk (04/04/2024)  Alcohol Screen: Low Risk (03/31/2024)  Depression (PHQ2-9): Low Risk (04/04/2024)  Financial Resource Strain: Low Risk (03/31/2024)  Physical Activity: Sufficiently Active (04/04/2024)  Social Connections: Moderately Integrated (04/04/2024)  Stress: No Stress Concern Present (04/04/2024)  Tobacco Use: Low Risk (04/04/2024)  Health Literacy: Adequate Health Literacy (04/04/2024)   See flowsheets for full screening details  Depression Screen PHQ 2 & 9 Depression Scale- Over the past 2 weeks, how often have you been bothered by any of the following problems? Little interest or pleasure in doing things: 0 Feeling down, depressed, or hopeless (PHQ Adolescent also includes...irritable): 0 PHQ-2 Total Score: 0     Goals Addressed             This Visit's Progress    Patient Stated   On track     04/02/23-Stay healthy and active              Objective:    Today's Vitals   04/04/24 1038  BP: 122/68   There is no height or weight on file to calculate BMI.  Hearing/Vision screen No results found. Immunizations and Health Maintenance Health Maintenance  Topic Date Due   Influenza Vaccine  10/15/2023   COVID-19 Vaccine (4 - 2025-26 season) 11/15/2023   Medicare Annual Wellness (AWV)  04/01/2024   DTaP/Tdap/Td (3 - Td or Tdap) 07/31/2033   Pneumococcal Vaccine: 50+ Years  Completed   Zoster Vaccines- Shingrix  Completed   Meningococcal B Vaccine  Aged Out        Assessment/Plan:  This is a routine wellness examination for Samuel Matthews.  Patient Care Team: Rilla Baller, MD as PCP - General (Family Medicine) [facility], [facility]  I have personally reviewed and noted the following in the patients chart:   Medical and social history Use of alcohol, tobacco or illicit drugs  Current medications and supplements including opioid prescriptions. Functional ability and status Nutritional status Physical activity Advanced directives List of other physicians Hospitalizations, surgeries, and ER visits in previous 12 months Vitals Screenings to include cognitive, depression, and falls Referrals and appointments  No orders of the defined types were placed in this encounter.  In addition, I have reviewed and discussed with patient certain preventive protocols, quality metrics, and best practice recommendations. A written personalized care plan for preventive services as well as general preventive health recommendations were provided to patient.   Samuel LITTIE Saris, LPN   8/79/7973    After Visit Summary: (In Person-Declined) Patient declined AVS at this time.  Nurse Notes: No voiced or noted concerns at this time Patient advised to keep follow-up appointment with PCP (March 2026) Vaccines not given: Influenza and Covid declined today  "

## 2024-04-05 ENCOUNTER — Ambulatory Visit: Admitting: Dermatology

## 2024-04-05 ENCOUNTER — Encounter

## 2024-04-05 DIAGNOSIS — L570 Actinic keratosis: Secondary | ICD-10-CM | POA: Diagnosis not present

## 2024-04-05 MED ORDER — AMINOLEVULINIC ACID HCL 10 % EX GEL
2000.0000 mg | Freq: Once | CUTANEOUS | Status: AC
  Administered 2024-04-05: 2000 mg via TOPICAL

## 2024-04-05 MED ORDER — VALACYCLOVIR HCL 1 G PO TABS: ORAL_TABLET | ORAL | 0 refills | Status: AC

## 2024-04-05 NOTE — Progress Notes (Signed)
 Patient completed red light phototherapy with debridement today to face.  ACTINIC KERATOSES Exam: Erythematous thin papules/macules with gritty scale.  Treatment Plan:  Red Light Photodynamic therapy  Procedure discussed: discussed risks, benefits, side effects. and alternatives   Prep: site scrubbed/prepped with acetone   Debridement needed: Yes (performed by Physician with sand paper.  (CPT [id number])) Location:  face Number of lesions:  Multiple (> 15) Type of treatment:  Red light Aminolevulinic Acid (see MAR for details): Ameluz Aminolevulinic Acid comment:  [id number] Amount of Ameluz (mg):  1 Incubation time (minutes):  60 Number of minutes under lamp:  20 Cooling:  Fan Outcome: patient tolerated procedure well with no complications   Post-procedure details: sunscreen applied and aftercare instructions given to patient    Related Medications Aminolevulinic Acid HCl 10 % GEL 2,000 mg  Amanda White, RMA  I personally debrided area prior to application of aminolevulinic acid, Artemio Larry MD  Documentation: I have reviewed the above documentation for accuracy and completeness, and I agree with the above.  Artemio Larry, MD

## 2024-04-05 NOTE — Patient Instructions (Signed)

## 2024-06-05 ENCOUNTER — Other Ambulatory Visit

## 2024-06-12 ENCOUNTER — Encounter: Admitting: Family Medicine

## 2024-09-04 ENCOUNTER — Ambulatory Visit: Admitting: Dermatology

## 2025-04-09 ENCOUNTER — Other Ambulatory Visit

## 2025-04-16 ENCOUNTER — Ambulatory Visit

## 2025-04-16 ENCOUNTER — Encounter: Admitting: Family Medicine

## 2025-05-14 ENCOUNTER — Ambulatory Visit
